# Patient Record
Sex: Male | Born: 1935 | Race: White | Hispanic: No | Marital: Married | Smoking: Former smoker
Health system: Southern US, Community
[De-identification: ages and names within clinical notes are randomized; demographics above are authoritative.]

## PROBLEM LIST (undated history)

## (undated) DIAGNOSIS — E785 Hyperlipidemia, unspecified: Secondary | ICD-10-CM

## (undated) DIAGNOSIS — J449 Chronic obstructive pulmonary disease, unspecified: Secondary | ICD-10-CM

## (undated) DIAGNOSIS — C44629 Squamous cell carcinoma of skin of left upper limb, including shoulder: Secondary | ICD-10-CM

## (undated) DIAGNOSIS — E079 Disorder of thyroid, unspecified: Secondary | ICD-10-CM

## (undated) DIAGNOSIS — K219 Gastro-esophageal reflux disease without esophagitis: Secondary | ICD-10-CM

## (undated) DIAGNOSIS — I10 Essential (primary) hypertension: Secondary | ICD-10-CM

## (undated) DIAGNOSIS — D649 Anemia, unspecified: Secondary | ICD-10-CM

## (undated) DIAGNOSIS — D126 Benign neoplasm of colon, unspecified: Secondary | ICD-10-CM

## (undated) HISTORY — DX: Hyperlipidemia, unspecified: E78.5

## (undated) HISTORY — DX: Squamous cell carcinoma of skin of left upper limb, including shoulder: C44.629

## (undated) HISTORY — DX: Benign neoplasm of colon, unspecified: D12.6

## (undated) HISTORY — DX: Essential (primary) hypertension: I10

## (undated) HISTORY — DX: Disorder of thyroid, unspecified: E07.9

## (undated) HISTORY — DX: Anemia, unspecified: D64.9

---

## 1968-01-30 HISTORY — PX: APPENDECTOMY: SHX54

## 2008-11-25 ENCOUNTER — Ambulatory Visit: Payer: Self-pay | Admitting: Cardiovascular Disease

## 2009-01-29 HISTORY — PX: COLONOSCOPY W/ POLYPECTOMY: SHX1380

## 2009-09-22 ENCOUNTER — Ambulatory Visit: Payer: Self-pay | Admitting: Gastroenterology

## 2009-10-10 ENCOUNTER — Ambulatory Visit: Payer: Self-pay | Admitting: Internal Medicine

## 2010-01-29 HISTORY — PX: CATARACT EXTRACTION, BILATERAL: SHX1313

## 2010-01-31 ENCOUNTER — Ambulatory Visit: Payer: Self-pay | Admitting: Ophthalmology

## 2010-02-07 ENCOUNTER — Ambulatory Visit: Payer: Self-pay | Admitting: Ophthalmology

## 2010-05-16 ENCOUNTER — Ambulatory Visit: Payer: Self-pay | Admitting: Ophthalmology

## 2010-10-28 IMAGING — NM NM THYROID IMAGING W/ UPTAKE SINGLE (24 HR)
1 series · 3 of 3 positions shown · non-contrast
Comparison: none

REASON FOR EXAM: HYPERTHYROIDISM PT IS ON METHIMAZOLE
COMMENTS:

[Series 1000: (id) thyroid scan · 2.40mm/px · 3 of 3 slices shown]
[im 1/3  full-range]
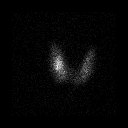
[im 2/3  full-range]
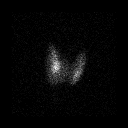
[im 3/3  full-range]
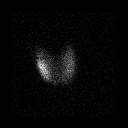

[3 of 3 positions shown; findings below may reference images not displayed]

PROCEDURE:     NM  - NM THYROID 7-100 24 HR [DATE]  [DATE]

RESULT:     The patient was given a dose of 148.3 uCi of Aodine-TN2 with
uptake calculations performed at 6 and 24 hours measuring 11.2 and
respectively which are both within normal limits. The gland demonstrates
relatively homogeneous pattern of uptake with slightly greater uptake in the
mid right lobe which could be secondary to geometric factors of it being
slightly larger and possibly thicker. No photopenia or dominant mass is
evident.
IMPRESSION: No definite findings to suggest hyperthyroidism based on
Aodine-TN2 scan and uptake.

## 2011-02-23 DIAGNOSIS — R946 Abnormal results of thyroid function studies: Secondary | ICD-10-CM | POA: Diagnosis not present

## 2011-02-28 LAB — HM DIABETES EYE EXAM

## 2011-03-02 DIAGNOSIS — E0500 Thyrotoxicosis with diffuse goiter without thyrotoxic crisis or storm: Secondary | ICD-10-CM | POA: Diagnosis not present

## 2011-05-16 DIAGNOSIS — E0500 Thyrotoxicosis with diffuse goiter without thyrotoxic crisis or storm: Secondary | ICD-10-CM | POA: Diagnosis not present

## 2011-05-17 DIAGNOSIS — E0590 Thyrotoxicosis, unspecified without thyrotoxic crisis or storm: Secondary | ICD-10-CM | POA: Diagnosis not present

## 2011-05-17 DIAGNOSIS — E119 Type 2 diabetes mellitus without complications: Secondary | ICD-10-CM | POA: Diagnosis not present

## 2011-05-17 DIAGNOSIS — R209 Unspecified disturbances of skin sensation: Secondary | ICD-10-CM | POA: Diagnosis not present

## 2011-06-26 ENCOUNTER — Encounter: Payer: Self-pay | Admitting: Internal Medicine

## 2011-06-26 ENCOUNTER — Ambulatory Visit (INDEPENDENT_AMBULATORY_CARE_PROVIDER_SITE_OTHER): Payer: Medicare Other | Admitting: Internal Medicine

## 2011-06-26 VITALS — BP 160/70 | HR 85 | Temp 98.5°F | Resp 14 | Ht 73.0 in | Wt 190.2 lb

## 2011-06-26 DIAGNOSIS — E785 Hyperlipidemia, unspecified: Secondary | ICD-10-CM

## 2011-06-26 DIAGNOSIS — E119 Type 2 diabetes mellitus without complications: Secondary | ICD-10-CM | POA: Diagnosis not present

## 2011-06-26 DIAGNOSIS — Z8719 Personal history of other diseases of the digestive system: Secondary | ICD-10-CM

## 2011-06-26 DIAGNOSIS — F1010 Alcohol abuse, uncomplicated: Secondary | ICD-10-CM

## 2011-06-26 DIAGNOSIS — D649 Anemia, unspecified: Secondary | ICD-10-CM

## 2011-06-26 DIAGNOSIS — I10 Essential (primary) hypertension: Secondary | ICD-10-CM | POA: Diagnosis not present

## 2011-06-26 DIAGNOSIS — Z79899 Other long term (current) drug therapy: Secondary | ICD-10-CM | POA: Diagnosis not present

## 2011-06-26 DIAGNOSIS — E0590 Thyrotoxicosis, unspecified without thyrotoxic crisis or storm: Secondary | ICD-10-CM

## 2011-06-26 DIAGNOSIS — M25551 Pain in right hip: Secondary | ICD-10-CM

## 2011-06-26 DIAGNOSIS — D126 Benign neoplasm of colon, unspecified: Secondary | ICD-10-CM

## 2011-06-26 MED ORDER — METOPROLOL SUCCINATE ER 50 MG PO TB24: 50.0000 mg | ORAL_TABLET | Freq: Every day | ORAL | Status: DC

## 2011-06-26 NOTE — Progress Notes (Signed)
Patient ID: Terry [name], male   DOB: [birth year], 78 y.o.   MRN: [id number]  Patient Active Problem List  Diagnoses  . Hyperlipidemia LDL goal < 70  . Hypertension  . Hyperthyroidism  . Tubulovillous adenoma of colon  . History of esophagitis  . Hip pain, acute, right  . Alcohol abuse, unspecified  . Diabetes mellitus type 2, noninsulin dependent  . Hyperlipidemia  . Anemia    Subjective:  CC:   Chief Complaint  Patient presents with  . New Patient    HPI:   Terry [name] a 78 y.o. male who presents New patient.  Terry [name] is a 78 yr old white male with a history of hyperthyroidism, hyperlipidemia and type 2 DM who presents for establishment of care.  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  But now has migrated to posterior lateral side of hip and is aggravated by lying on it ,  But not by sitting.  Describes the pain as a continuous throb.  He has been taking ibuprofen up to 5 tablets daily .   Pain does not radiate.    2nd issue is CAD.  He states that Dr. Welton Flakes has opined that he may have a blockage in his heart but has not had a comprehensive cardiology evaluation.  He denies chest pain, jaw pain and arm pain.  He does not exercise regularly.  He has hyperthyroid disease, managed by Dr. Renae Fickle with methimazole for the  last year.  3rd issue is type 2 diabetes.    On glipizide,  No history of hypoglycemic episodes.  Has a meter at home ,  Does not use it very often.  No history of low blood sugars, foot ulcers, foot infections.    Past Medical History  Diagnosis Date  . Diabetes mellitus   . hyperthyroidism   . Hyperlipidemia   . Hypertension   . Anemia     Past Surgical History  Procedure Date  . Cataract extraction, bilateral 2012    Porfilio  . Colonoscopy w/ polypectomy 2011  . Appendectomy 1970         The following portions of the patient's history  were reviewed and updated as appropriate: Allergies, current medications, and problem list.    Review of Systems:   12 Pt  review of systems was negative except those addressed in the HPI,     History   Social History  . Marital Status: Married    Spouse Name: N/A    Number of Children: N/A  . Years of Education: N/A   Occupational History  . Not on file.   Social History Main Topics  . Smoking status: Former Smoker    Types: Cigarettes    Quit date: 06/26/1999  . Smokeless tobacco: Never Used  . Alcohol Use: 15.0 oz/week    25 Glasses of wine per week  . Drug Use: No  . Sexually Active: Not on file   Other Topics Concern  . Not on file   Social History Narrative  . No narrative on file    Objective:  BP 160/70  Pulse 85  Temp(Src) 98.5 F (36.9 C) (Oral)  Resp 14  Ht 6\' 1"  (1.854 m)  Wt 190 lb 4 oz (86.297 kg)  BMI 25.10 kg/m2  SpO2 94%  General appearance: alert, cooperative and appears stated age Ears: normal TM's and external ear canals  both ears Throat: lips, mucosa, and tongue normal; teeth and gums normal Neck: no adenopathy, no carotid bruit, supple, symmetrical, trachea midline and thyroid not enlarged, symmetric, no tenderness/mass/nodules Back: symmetric, no curvature. ROM normal. No CVA tenderness. Lungs: clear to auscultation bilaterally Heart: regular rate and rhythm, S1, S2 normal, no murmur, click, rub or gallop Abdomen: soft, non-tender; bowel sounds normal; no masses,  no organomegaly Pulses: 2+ and symmetric Skin: Skin color, texture, turgor normal. No rashes or lesions Lymph nodes: Cervical, supraclavicular, and axillary nodes normal.  Assessment and Plan:  Hyperlipidemia LDL goal < 70 .taking simvastatin.  return or fasting labs.   Hyperthyroidism Managed by Dr. Renae Fickle with methimazole.   Hypertension Managed with enalapril.  No cough.  BMET due.  Tubulovillous adenoma of colon By colonoscopy August 2011,  Iftikhar.  Will  need repeat in 2014.   History of esophagitis By EGD August 2011, positive candida , negative H pylori and dysplasia.  Not currently on PPI.   Hip pain, acute, right Some pain with internal rotation. Exam more consistent with ischial  tuberosity bursitis.  NSIADs, Dexilant,  If no improvement 2 weeks x ray advised of hip joint.   Alcohol abuse, unspecified Patient described his daily alcohol use as "too much." Advised to reduce daily consumption to 2 glasses of wine max to prevent liver disease and loss of glycemic control.   Anemia Details unclear.  Prompted GI eval in 2011.   Diabetes mellitus type 2, noninsulin dependent Unknown level of control since he is new to practice and records from Dr. Welton Flakes are not currently available.  Does not check sugars.  No changes today.  Will return later in week for labs  Reminders for annual eye exam given, low glycemic index diet discussed and advised to reduce consumption of alcohol to 2 glasses daily .      Updated Medication List Outpatient Encounter Prescriptions as of 06/26/2011  Medication Sig Dispense Refill  . Cholecalciferol (VITAMIN D3) 1000 UNITS CAPS Take by mouth.      Marland Kitchen CINNAMON PO Take by mouth.      . enalapril (VASOTEC) 20 MG tablet Take 20 mg by mouth daily.      . Garlic 10 MG CAPS Take by mouth.      Marland Kitchen glipiZIDE (GLUCOTROL XL) 2.5 MG 24 hr tablet Take 2.5 mg by mouth daily.      . methimazole (TAPAZOLE) 5 MG tablet Take 2.5 mg by mouth daily.      . metoprolol succinate (TOPROL-XL) 50 MG 24 hr tablet Take 1 tablet (50 mg total) by mouth daily. Take with or immediately following a meal.  90 tablet  3  . niacin 500 MG tablet Take 500 mg by mouth daily with breakfast.      . simvastatin (ZOCOR) 40 MG tablet Take 40 mg by mouth every evening.         Orders Placed This Encounter  Procedures  . Lipid panel  . COMPLETE METABOLIC PANEL WITH GFR  . Microalbumin / creatinine urine ratio  . Hemoglobin A1c  . HM DIABETES EYE EXAM   . HM COLONOSCOPY    No Follow-up on file.      ,note

## 2011-06-26 NOTE — Patient Instructions (Signed)
Return for fasting las tomorrow.  I think your pain is coming from bursitis of the ischial tuberosity and will continue ot imporve with time.   I want you take take Dexilant daily for your stomach while you are using ibuprofen   You can take 500 mg of tylenol 4 times daily  ( or 1000 mg twice daily ) with the ibuprofen foe  Pain control.  Reduce wine to 2 healthy glasses daily to prevent liver disease  ______________________________________________________________  Consider the Low Glycemic Index Diet and 6 smaller meals daily .  This boosts your metabolism and regulates your sugars:   7 AM Low carbohydrate Protein  Shakes (EAS Carb Control  Or Atkins ,  Available everywhere,   In  cases at [employer] )  2.5 carbs  (Add or substitute a toasted sandwhich thin w/ peanut butter)  10 AM: Protein bar by Atkins (snack size,  Chocolate lover's variety at  [employer])    Lunch: sandwich on pita bread or flatbread (Joseph's makes a pita bread and a flat bread , available at [employer] and [employer]; Toufayah makes a low carb flatbread available at [employer] and [employer]) Mission makes a low carb whole wheat tortilla available at [employer] most grocery stores   3 PM:  Mid day :  Another protein bar,  Or a  cheese stick, 1/4 cup of almonds, walnuts, pistachios, pecans, peanuts,  Macadamia nuts  6 PM  Dinner:  "mean and green:"  Meat/chicken/fish, salad, and green veggie : use ranch, vinagrette,  Blue cheese, etc  9 PM snack : Breyer's low carb fudgsicle or  ice cream bar (Carb Smart), or  Weight Watcher's ice cream bar , or another protein shake

## 2011-06-28 ENCOUNTER — Encounter: Payer: Self-pay | Admitting: Internal Medicine

## 2011-06-28 ENCOUNTER — Other Ambulatory Visit (INDEPENDENT_AMBULATORY_CARE_PROVIDER_SITE_OTHER): Payer: Medicare Other | Admitting: *Deleted

## 2011-06-28 DIAGNOSIS — I10 Essential (primary) hypertension: Secondary | ICD-10-CM | POA: Insufficient documentation

## 2011-06-28 DIAGNOSIS — Z79899 Other long term (current) drug therapy: Secondary | ICD-10-CM

## 2011-06-28 DIAGNOSIS — M25551 Pain in right hip: Secondary | ICD-10-CM | POA: Insufficient documentation

## 2011-06-28 DIAGNOSIS — E1129 Type 2 diabetes mellitus with other diabetic kidney complication: Secondary | ICD-10-CM | POA: Insufficient documentation

## 2011-06-28 DIAGNOSIS — E119 Type 2 diabetes mellitus without complications: Secondary | ICD-10-CM | POA: Diagnosis not present

## 2011-06-28 DIAGNOSIS — E785 Hyperlipidemia, unspecified: Secondary | ICD-10-CM | POA: Diagnosis not present

## 2011-06-28 DIAGNOSIS — R809 Proteinuria, unspecified: Secondary | ICD-10-CM | POA: Insufficient documentation

## 2011-06-28 DIAGNOSIS — D126 Benign neoplasm of colon, unspecified: Secondary | ICD-10-CM

## 2011-06-28 DIAGNOSIS — N183 Chronic kidney disease, stage 3 unspecified: Secondary | ICD-10-CM | POA: Insufficient documentation

## 2011-06-28 DIAGNOSIS — E1122 Type 2 diabetes mellitus with diabetic chronic kidney disease: Secondary | ICD-10-CM | POA: Insufficient documentation

## 2011-06-28 DIAGNOSIS — IMO0001 Reserved for inherently not codable concepts without codable children: Secondary | ICD-10-CM

## 2011-06-28 DIAGNOSIS — Z8719 Personal history of other diseases of the digestive system: Secondary | ICD-10-CM | POA: Insufficient documentation

## 2011-06-28 DIAGNOSIS — E0590 Thyrotoxicosis, unspecified without thyrotoxic crisis or storm: Secondary | ICD-10-CM | POA: Insufficient documentation

## 2011-06-28 DIAGNOSIS — D649 Anemia, unspecified: Secondary | ICD-10-CM | POA: Insufficient documentation

## 2011-06-28 DIAGNOSIS — F1011 Alcohol abuse, in remission: Secondary | ICD-10-CM | POA: Insufficient documentation

## 2011-06-28 HISTORY — DX: Benign neoplasm of colon, unspecified: D12.6

## 2011-06-28 LAB — COMPREHENSIVE METABOLIC PANEL
AST: 18 U/L (ref 0–37)
Albumin: 4 g/dL (ref 3.5–5.2)
BUN: 20 mg/dL (ref 6–23)
CO2: 25 mEq/L (ref 19–32)
Calcium: 9.5 mg/dL (ref 8.4–10.5)
Chloride: 110 mEq/L (ref 96–112)
Creatinine, Ser: 1.2 mg/dL (ref 0.4–1.5)
GFR: 64.35 mL/min (ref >60.00)
Potassium: 4.8 mEq/L (ref 3.5–5.1)

## 2011-06-28 LAB — HEMOGLOBIN A1C: Hgb A1c MFr Bld: 6.2 % (ref 4.6–6.5)

## 2011-06-28 LAB — MICROALBUMIN / CREATININE URINE RATIO
Creatinine,U: 64.4 mg/dL
Microalb Creat Ratio: 2.2 mg/g (ref 0.0–30.0)
Microalb, Ur: 1.4 mg/dL (ref 0.0–1.9)

## 2011-06-28 LAB — LIPID PANEL
Cholesterol: 176 mg/dL (ref 0–200)
VLDL: 41.8 mg/dL — ABNORMAL HIGH (ref 0.0–40.0)

## 2011-06-28 LAB — LDL CHOLESTEROL, DIRECT: Direct LDL: 74.7 mg/dL

## 2011-06-28 NOTE — Assessment & Plan Note (Addendum)
Some pain with internal rotation. Exam more consistent with ischial  tuberosity bursitis.  NSIADs, Dexilant,  If no improvement 2 weeks x ray advised of hip joint.

## 2011-06-28 NOTE — Assessment & Plan Note (Signed)
.  taking simvastatin.  return or fasting labs.

## 2011-06-28 NOTE — Assessment & Plan Note (Signed)
Managed by Dr. Renae Fickle with methimazole.

## 2011-06-28 NOTE — Assessment & Plan Note (Signed)
By colonoscopy August 2011,  Iftikhar.  Will need repeat in 2014.

## 2011-06-28 NOTE — Assessment & Plan Note (Signed)
Unknown level of control since he is new to practice and records from Dr. Welton Flakes are not currently available.  Does not check sugars.  No changes today.  Will return later in week for labs  Reminders for annual eye exam given, low glycemic index diet discussed and advised to reduce consumption of alcohol to 2 glasses daily .

## 2011-06-28 NOTE — Assessment & Plan Note (Signed)
By EGD August 2011, positive candida , negative H pylori and dysplasia.  Not currently on PPI.

## 2011-06-28 NOTE — Assessment & Plan Note (Signed)
Details unclear.  Prompted GI eval in 2011.

## 2011-06-28 NOTE — Assessment & Plan Note (Deleted)
Unknown level of control since he is new to practice and records from Dr. Khan are not currently available.  Does not check sugars.  No changes today.  Will return later in week for labs  Reminders for annual eye exam given, low glycemic index diet discussed and advised to reduce consumption of alcohol to 2 glasses daily .   

## 2011-06-28 NOTE — Assessment & Plan Note (Signed)
Patient described his daily alcohol use as "too much." Advised to reduce daily consumption to 2 glasses of wine max to prevent liver disease and loss of glycemic control.

## 2011-06-28 NOTE — Assessment & Plan Note (Signed)
Managed with enalapril.  No cough.  BMET due.

## 2011-07-04 ENCOUNTER — Ambulatory Visit: Payer: Self-pay | Admitting: Internal Medicine

## 2011-07-13 ENCOUNTER — Ambulatory Visit: Payer: Self-pay | Admitting: Internal Medicine

## 2011-07-26 ENCOUNTER — Other Ambulatory Visit: Payer: Self-pay | Admitting: Internal Medicine

## 2011-08-16 ENCOUNTER — Other Ambulatory Visit: Payer: Self-pay | Admitting: *Deleted

## 2011-08-16 MED ORDER — SIMVASTATIN 40 MG PO TABS: 40.0000 mg | ORAL_TABLET | Freq: Every evening | ORAL | Status: DC

## 2011-08-28 DIAGNOSIS — E0500 Thyrotoxicosis with diffuse goiter without thyrotoxic crisis or storm: Secondary | ICD-10-CM | POA: Diagnosis not present

## 2011-09-04 DIAGNOSIS — E119 Type 2 diabetes mellitus without complications: Secondary | ICD-10-CM | POA: Diagnosis not present

## 2011-09-04 DIAGNOSIS — E538 Deficiency of other specified B group vitamins: Secondary | ICD-10-CM | POA: Diagnosis not present

## 2011-09-04 DIAGNOSIS — E0520 Thyrotoxicosis with toxic multinodular goiter without thyrotoxic crisis or storm: Secondary | ICD-10-CM | POA: Diagnosis not present

## 2011-10-20 ENCOUNTER — Other Ambulatory Visit: Payer: Self-pay | Admitting: Internal Medicine

## 2011-12-10 DIAGNOSIS — E538 Deficiency of other specified B group vitamins: Secondary | ICD-10-CM | POA: Diagnosis not present

## 2011-12-10 DIAGNOSIS — E0520 Thyrotoxicosis with toxic multinodular goiter without thyrotoxic crisis or storm: Secondary | ICD-10-CM | POA: Diagnosis not present

## 2011-12-10 DIAGNOSIS — E119 Type 2 diabetes mellitus without complications: Secondary | ICD-10-CM | POA: Diagnosis not present

## 2011-12-11 DIAGNOSIS — E538 Deficiency of other specified B group vitamins: Secondary | ICD-10-CM | POA: Diagnosis not present

## 2011-12-11 DIAGNOSIS — E119 Type 2 diabetes mellitus without complications: Secondary | ICD-10-CM | POA: Diagnosis not present

## 2011-12-11 DIAGNOSIS — E0590 Thyrotoxicosis, unspecified without thyrotoxic crisis or storm: Secondary | ICD-10-CM | POA: Diagnosis not present

## 2011-12-19 ENCOUNTER — Ambulatory Visit (INDEPENDENT_AMBULATORY_CARE_PROVIDER_SITE_OTHER): Payer: Medicare Other | Admitting: Internal Medicine

## 2011-12-19 VITALS — BP 134/68 | HR 83 | Temp 98.0°F | Resp 12 | Ht 73.0 in | Wt 189.5 lb

## 2011-12-19 DIAGNOSIS — R05 Cough: Secondary | ICD-10-CM | POA: Diagnosis not present

## 2011-12-19 DIAGNOSIS — J40 Bronchitis, not specified as acute or chronic: Secondary | ICD-10-CM

## 2011-12-19 MED ORDER — PREDNISONE (PAK) 10 MG PO TABS: ORAL_TABLET | ORAL | Status: DC

## 2011-12-19 MED ORDER — METHYLPREDNISOLONE ACETATE 40 MG/ML IJ SUSP
40.0000 mg | Freq: Once | INTRAMUSCULAR | Status: AC
  Administered 2011-12-19: 40 mg via INTRAMUSCULAR

## 2011-12-19 MED ORDER — LEVOFLOXACIN 750 MG PO TABS: 750.0000 mg | ORAL_TABLET | Freq: Every day | ORAL | Status: DC

## 2011-12-19 MED ORDER — ALBUTEROL SULFATE HFA 108 (90 BASE) MCG/ACT IN AERS: 2.0000 | INHALATION_SPRAY | Freq: Four times a day (QID) | RESPIRATORY_TRACT | Status: DC | PRN

## 2011-12-19 NOTE — Progress Notes (Signed)
Patient ID: Terry [name], male   DOB: [birth year], 78 y.o.   MRN: [id number]  Patient Active Problem List  Diagnosis  . Hyperlipidemia LDL goal < 70  . Hypertension  . Hyperthyroidism  . Tubulovillous adenoma of colon  . History of esophagitis  . Hip pain, acute, right  . Alcohol abuse, unspecified  . Diabetes mellitus type 2, noninsulin dependent  . Hyperlipidemia  . Anemia  . Bronchitis    Subjective:  CC:   Chief Complaint  Patient presents with  . Follow-up    cough    HPI:   Terry [name] a 78 y.o. male who presents treated for sinusitis last week with amoxicillin  875 mg twice daily by Dr. Renae [name] with no improvement.  Drainage was clear  But she had a cough productrive of green sputum.   Sinuses are still draining and his sputum is still green ,  No blood in it . No fevers,  Muscle aches.  But very tired. Mild  nausea but no vomiting.   The cough has improved..  Still feeling crappy and the drainiage starts up again in the morning and continues all day .    Aggravated by Terry [name] caught from wife Terry [name] during trip to [place].    Past Medical History  Diagnosis Date  . Diabetes mellitus   . hyperthyroidism   . Hyperlipidemia   . Hypertension   . Anemia     Past Surgical History  Procedure Date  . Cataract extraction, bilateral 2012    Terry [name]  . Colonoscopy w/ polypectomy 2011  . Appendectomy 1970    The following portions of the patient's history were reviewed and updated as appropriate: Allergies, current medications, and problem list.    Review of Systems:   Patient denies, fevers, malaise, unintentional weight loss, skin rash, eye pain, sinus congestion and sinus pain, sore throat, dysphagia,  hemoptysis ,dyspnea,  chest pain, palpitations, orthopnea, edema, abdominal pain, nausea, melena, diarrhea, constipation, flank pain, dysuria, hematuria, urinary  Frequency, nocturia, numbness, tingling, seizures,  Focal weakness, Loss of consciousness,  Tremor,  insomnia, depression, anxiety, and suicidal ideation.       History   Social History  . Marital Status: Married    Spouse Name: N/A    Number of Children: N/A  . Years of Education: N/A   Occupational History  . Not on file.   Social History Main Topics  . Smoking status: Former Smoker    Types: Cigarettes    Quit date: 06/26/1999  . Smokeless tobacco: Never Used  . Alcohol Use: 15.0 oz/week    25 Glasses of wine per week  . Drug Use: No  . Sexually Active: Not on file   Other Topics Concern  . Not on file   Social History Narrative  . No narrative on file    Objective:  BP 134/68  Pulse 83  Temp 98 F (36.7 C) (Oral)  Resp 12  Ht 6\' 1"  (1.854 m)  Wt 189 lb 8 oz (85.957 kg)  BMI 25.00 kg/m2  SpO2 97%  General appearance: alert, cooperative and appears stated age Ears: normal TM's and external ear canals both ears Throat: lips, mucosa, and tongue normal; teeth and gums normal Neck: no adenopathy, no carotid bruit, supple, symmetrical, trachea midline and thyroid not enlarged, symmetric, no tenderness/mass/nodules Back: symmetric, no curvature. ROM normal. No CVA tenderness. Lungs: clear to auscultation bilaterally Heart: regular rate and rhythm, S1, S2 normal, no murmur, click, rub or gallop Abdomen: soft,  non-tender; bowel sounds normal; no masses,  no organomegaly Pulses: 2+ and symmetric Skin: Skin color, texture, turgor normal. No rashes or lesions Lymph nodes: Cervical, supraclavicular, and axillary nodes normal.  Assessment and Plan:  Bronchitis I am prescribing a prednisone taper, a 7 day course of levaquin, and albuterol inhaler if needed for wheezing/chest tightness (use every 6 hours if needed).    Updated Medication List Outpatient Encounter Prescriptions as of 12/19/2011  Medication Sig Dispense Refill  . [EXPIRED] amoxicillin (AMOXIL) 875 MG tablet Take 875 mg by mouth 2 (two) times daily.      . Cholecalciferol (VITAMIN D3) 1000 UNITS  CAPS Take by mouth.      Marland Kitchen CINNAMON PO Take by mouth.      . enalapril (VASOTEC) 20 MG tablet Take 20 mg by mouth daily.      . Garlic 10 MG CAPS Take by mouth.      Marland Kitchen glipiZIDE (GLUCOTROL XL) 2.5 MG 24 hr tablet TAKE 1 TABLET BY MOUTH EVERY DAY  30 tablet  6  . methimazole (TAPAZOLE) 5 MG tablet Take 2.5 mg by mouth daily.      . metoprolol succinate (TOPROL-XL) 50 MG 24 hr tablet Take 1 tablet (50 mg total) by mouth daily. Take with or immediately following a meal.  90 tablet  3  . niacin 500 MG tablet Take 500 mg by mouth daily with breakfast.      . simvastatin (ZOCOR) 40 MG tablet Take 1 tablet (40 mg total) by mouth every evening.  30 tablet  6  . albuterol (PROVENTIL HFA;VENTOLIN HFA) 108 (90 BASE) MCG/ACT inhaler Inhale 2 puffs into the lungs every 6 (six) hours as needed for wheezing.  1 Inhaler  0  . levofloxacin (LEVAQUIN) 750 MG tablet Take 1 tablet (750 mg total) by mouth daily.  7 tablet  0  . predniSONE (STERAPRED UNI-PAK) 10 MG tablet 6 tablets on Day 1 , then reduce by 1 tablet daily until gone  21 tablet  0  . [EXPIRED] methylPREDNISolone acetate (DEPO-MEDROL) injection 40 mg          No orders of the defined types were placed in this encounter.    No Follow-up on file.

## 2011-12-19 NOTE — Patient Instructions (Addendum)
I am prescribing you a prednisone taper, a 7 day course of levaquin,  And an albuterol inhaler to use every 6 hours if needed for wheezing/chest tightness (2 puffs every 6 hours if needed).     Use the Robitussin DM or Delsym OTC for daytime cough  You can try taking benadryl 25 mg at bedtime which may help dry your nose.

## 2011-12-22 ENCOUNTER — Encounter: Payer: Self-pay | Admitting: Internal Medicine

## 2011-12-22 DIAGNOSIS — J449 Chronic obstructive pulmonary disease, unspecified: Secondary | ICD-10-CM | POA: Insufficient documentation

## 2011-12-22 NOTE — Assessment & Plan Note (Signed)
I am prescribing a prednisone taper, a 7 day course of levaquin, and albuterol inhaler if needed for wheezing/chest tightness (use every 6 hours if needed).

## 2012-03-15 ENCOUNTER — Other Ambulatory Visit: Payer: Self-pay

## 2012-03-20 ENCOUNTER — Other Ambulatory Visit: Payer: Self-pay | Admitting: Internal Medicine

## 2012-03-20 NOTE — Telephone Encounter (Signed)
Med filled.  

## 2012-05-25 ENCOUNTER — Other Ambulatory Visit: Payer: Self-pay | Admitting: Internal Medicine

## 2012-05-25 DIAGNOSIS — E119 Type 2 diabetes mellitus without complications: Secondary | ICD-10-CM

## 2012-05-25 DIAGNOSIS — E785 Hyperlipidemia, unspecified: Secondary | ICD-10-CM

## 2012-05-25 NOTE — Telephone Encounter (Signed)
Please inform patient that as a patient with DM he should be seen every 3 months and his liver enzymes should be checked at least every 6 months because of the statin he is taking. .  Last seen May 2013!!  Refill glipizide for 90 days but get him a 30 appt with fasting labs prior to visit .  I wil enter labs.

## 2012-05-26 NOTE — Telephone Encounter (Signed)
Spoke with pt's wife, advised on need for labs and visit. She stated he has an appointment with his endocrinologist in May where they will draw labs, and will have labs sent to  Dr. Darrick Huntsman. States pt will call back to schedule a visit with Dr. Darrick Huntsman.

## 2012-06-12 ENCOUNTER — Other Ambulatory Visit: Payer: Self-pay | Admitting: Internal Medicine

## 2012-06-12 ENCOUNTER — Ambulatory Visit (INDEPENDENT_AMBULATORY_CARE_PROVIDER_SITE_OTHER): Payer: Medicare Other | Admitting: Adult Health

## 2012-06-12 ENCOUNTER — Encounter: Payer: Self-pay | Admitting: Adult Health

## 2012-06-12 VITALS — BP 168/72 | HR 65 | Temp 97.9°F | Resp 12 | Wt 193.0 lb

## 2012-06-12 DIAGNOSIS — H6190 Disorder of external ear, unspecified, unspecified ear: Secondary | ICD-10-CM

## 2012-06-12 DIAGNOSIS — E119 Type 2 diabetes mellitus without complications: Secondary | ICD-10-CM | POA: Diagnosis not present

## 2012-06-12 DIAGNOSIS — H6192 Disorder of left external ear, unspecified: Secondary | ICD-10-CM

## 2012-06-12 DIAGNOSIS — H9390 Unspecified disorder of ear, unspecified ear: Secondary | ICD-10-CM | POA: Insufficient documentation

## 2012-06-12 DIAGNOSIS — I10 Essential (primary) hypertension: Secondary | ICD-10-CM | POA: Diagnosis not present

## 2012-06-12 DIAGNOSIS — E0590 Thyrotoxicosis, unspecified without thyrotoxic crisis or storm: Secondary | ICD-10-CM | POA: Diagnosis not present

## 2012-06-12 MED ORDER — ENALAPRIL MALEATE 20 MG PO TABS: 20.0000 mg | ORAL_TABLET | Freq: Every day | ORAL | Status: DC

## 2012-06-12 NOTE — Progress Notes (Signed)
  Subjective:    Patient ID: Terry [name], male    DOB: 1933/08/30, 79 y.o.   MRN: [id number]  HPI  Patient is a 78 year old male who presents to clinic for refills on his medications. He has run out of his enalapril. Patient was last seen in clinic early part of 2013. He is also followed by Dr. Renae Fickle at [facility] and he reports having an appointment for lab work later this morning. He is feeling well. He is staying active. Patient works at [employer] in [place]. He does have an area on his left ear that he would like to be looked at.  Current Outpatient Prescriptions on File Prior to Visit  Medication Sig Dispense Refill  . CINNAMON PO Take by mouth.      Marland Kitchen glipiZIDE (GLUCOTROL XL) 2.5 MG 24 hr tablet TAKE 1 TABLET BY MOUTH EVERY DAY  30 tablet  6  . methimazole (TAPAZOLE) 5 MG tablet Take 2.5 mg by mouth daily.      . metoprolol succinate (TOPROL-XL) 50 MG 24 hr tablet Take 1 tablet (50 mg total) by mouth daily. Take with or immediately following a meal.  90 tablet  3  . niacin 500 MG tablet Take 500 mg by mouth daily with breakfast.      . simvastatin (ZOCOR) 40 MG tablet TAKE 1 TABLET (40 MG TOTAL) BY MOUTH EVERY EVENING.  30 tablet  6  . albuterol (PROVENTIL HFA;VENTOLIN HFA) 108 (90 BASE) MCG/ACT inhaler Inhale 2 puffs into the lungs every 6 (six) hours as needed for wheezing.  1 Inhaler  0  . Cholecalciferol (VITAMIN D3) 1000 UNITS CAPS Take by mouth.      . Garlic 10 MG CAPS Take by mouth.       No current facility-administered medications on file prior to visit.      Review of Systems  Constitutional: Negative.   HENT:       Small, hard area on left ear. He has removed it and reports "it keeps coming back"  Eyes: Negative.   Respiratory: Negative.   Cardiovascular: Negative.   Gastrointestinal: Negative.   Endocrine: Negative.   Genitourinary: Negative.   Musculoskeletal: Negative.   Skin:       Small lesion on the left ear. No bleeding or  drainage.  Allergic/Immunologic: Negative.   Neurological: Negative.   Hematological: Negative.   Psychiatric/Behavioral: Negative.      BP 168/72  Pulse 65  Temp(Src) 97.9 F (36.6 C) (Oral)  Resp 12  Wt 193 lb (87.544 kg)  BMI 25.47 kg/m2  SpO2 95%    Objective:   Physical Exam  Constitutional: He is oriented to person, place, and time. He appears well-developed and well-nourished. No distress.  HENT:  Head: Normocephalic and atraumatic.  Cardiovascular: Normal rate, regular rhythm and normal heart sounds.  Exam reveals no gallop and no friction rub.   No murmur heard. Pulmonary/Chest: Effort normal and breath sounds normal. No respiratory distress. He has no wheezes. He has no rales.  Abdominal: Soft.  Musculoskeletal: Normal range of motion. He exhibits no edema.  Neurological: He is alert and oriented to person, place, and time.  Skin: Skin is warm and dry. No rash noted. No erythema.  Small white, hard lesion on the left ear.  Psychiatric: He has a normal mood and affect. His behavior is normal. Judgment and thought content normal.          Assessment & Plan:

## 2012-06-12 NOTE — Assessment & Plan Note (Signed)
Blood pressure is elevated this morning. Patient reports he has not taken his blood pressure medication and he has also run out of of his enalapril. Refills sent to pharmacy.

## 2012-06-12 NOTE — Assessment & Plan Note (Signed)
Patient reports this lesion is coming back. No drainage, bleeding or infection noted. Refer to dermatology for further evaluation.

## 2012-06-12 NOTE — Patient Instructions (Addendum)
  I have sent in the refills to your pharmacy.  Please have Dr. Renae Fickle send [place] a copy of your lab work.  I am referring you to [facility] to evaluate the small lesion on your left ear.  Do not hesitate to call with any questions or concerns.  Have a great summer!

## 2012-06-12 NOTE — Telephone Encounter (Signed)
Rx sent to pharmacy by escript  

## 2012-06-13 ENCOUNTER — Encounter: Payer: Self-pay | Admitting: Emergency Medicine

## 2012-06-17 DIAGNOSIS — E0500 Thyrotoxicosis with diffuse goiter without thyrotoxic crisis or storm: Secondary | ICD-10-CM | POA: Diagnosis not present

## 2012-06-17 DIAGNOSIS — E538 Deficiency of other specified B group vitamins: Secondary | ICD-10-CM | POA: Diagnosis not present

## 2012-06-17 DIAGNOSIS — E119 Type 2 diabetes mellitus without complications: Secondary | ICD-10-CM | POA: Diagnosis not present

## 2012-06-18 ENCOUNTER — Telehealth: Payer: Self-pay | Admitting: Internal Medicine

## 2012-06-18 DIAGNOSIS — E119 Type 2 diabetes mellitus without complications: Secondary | ICD-10-CM

## 2012-07-28 DIAGNOSIS — L219 Seborrheic dermatitis, unspecified: Secondary | ICD-10-CM | POA: Diagnosis not present

## 2012-07-28 DIAGNOSIS — L570 Actinic keratosis: Secondary | ICD-10-CM | POA: Diagnosis not present

## 2012-09-03 ENCOUNTER — Other Ambulatory Visit: Payer: Self-pay

## 2012-10-02 ENCOUNTER — Encounter: Payer: Self-pay | Admitting: Internal Medicine

## 2012-10-02 ENCOUNTER — Ambulatory Visit (INDEPENDENT_AMBULATORY_CARE_PROVIDER_SITE_OTHER)
Admission: RE | Admit: 2012-10-02 | Discharge: 2012-10-02 | Disposition: A | Discharge status: Home / Self Care | Payer: Medicare Other | Source: Ambulatory Visit | Attending: Internal Medicine | Admitting: Internal Medicine

## 2012-10-02 ENCOUNTER — Encounter: Payer: Self-pay | Admitting: Emergency Medicine

## 2012-10-02 ENCOUNTER — Encounter: Payer: Self-pay | Admitting: *Deleted

## 2012-10-02 ENCOUNTER — Ambulatory Visit (INDEPENDENT_AMBULATORY_CARE_PROVIDER_SITE_OTHER): Payer: Medicare Other | Admitting: Internal Medicine

## 2012-10-02 VITALS — BP 118/52 | HR 80 | Temp 97.7°F | Resp 14 | Ht 73.0 in | Wt 198.2 lb

## 2012-10-02 DIAGNOSIS — R0609 Other forms of dyspnea: Secondary | ICD-10-CM

## 2012-10-02 DIAGNOSIS — R0902 Hypoxemia: Secondary | ICD-10-CM | POA: Insufficient documentation

## 2012-10-02 DIAGNOSIS — E875 Hyperkalemia: Secondary | ICD-10-CM

## 2012-10-02 DIAGNOSIS — Z23 Encounter for immunization: Secondary | ICD-10-CM

## 2012-10-02 DIAGNOSIS — E785 Hyperlipidemia, unspecified: Secondary | ICD-10-CM

## 2012-10-02 DIAGNOSIS — R5381 Other malaise: Secondary | ICD-10-CM | POA: Insufficient documentation

## 2012-10-02 DIAGNOSIS — R0600 Dyspnea, unspecified: Secondary | ICD-10-CM | POA: Insufficient documentation

## 2012-10-02 DIAGNOSIS — Z79899 Other long term (current) drug therapy: Secondary | ICD-10-CM

## 2012-10-02 DIAGNOSIS — Z8719 Personal history of other diseases of the digestive system: Secondary | ICD-10-CM

## 2012-10-02 DIAGNOSIS — I10 Essential (primary) hypertension: Secondary | ICD-10-CM

## 2012-10-02 DIAGNOSIS — D649 Anemia, unspecified: Secondary | ICD-10-CM

## 2012-10-02 DIAGNOSIS — D126 Benign neoplasm of colon, unspecified: Secondary | ICD-10-CM

## 2012-10-02 LAB — COMPREHENSIVE METABOLIC PANEL
Albumin: 4.1 g/dL (ref 3.5–5.2)
BUN: 19 mg/dL (ref 6–23)
CO2: 28 mEq/L (ref 19–32)
Calcium: 9.6 mg/dL (ref 8.4–10.5)
Chloride: 104 mEq/L (ref 96–112)
Creatinine, Ser: 1.2 mg/dL (ref 0.4–1.5)
GFR: 59.98 mL/min — ABNORMAL LOW (ref >60.00)
Glucose, Bld: 131 mg/dL — ABNORMAL HIGH (ref 70–99)
Potassium: 5.3 mEq/L — ABNORMAL HIGH (ref 3.5–5.1)

## 2012-10-02 LAB — CBC WITH DIFFERENTIAL/PLATELET
Basophils Relative: 0.8 % (ref 0.0–3.0)
Eosinophils Relative: 3.7 % (ref 0.0–5.0)
Lymphocytes Relative: 26.9 % (ref 12.0–46.0)
MCV: 94.3 fl (ref 78.0–100.0)
Monocytes Absolute: 0.7 10*3/uL (ref 0.1–1.0)
Neutrophils Relative %: 59.1 % (ref 43.0–77.0)
Platelets: 317 10*3/uL (ref 150.0–400.0)
RBC: 3.94 Mil/uL — ABNORMAL LOW (ref 4.22–5.81)
WBC: 7.2 10*3/uL (ref 4.5–10.5)

## 2012-10-02 LAB — BRAIN NATRIURETIC PEPTIDE: Pro B Natriuretic peptide (BNP): 35 pg/mL (ref 0.0–100.0)

## 2012-10-02 MED ORDER — FLUTICASONE-SALMETEROL 250-50 MCG/DOSE IN AEPB: 1.0000 | INHALATION_SPRAY | Freq: Two times a day (BID) | RESPIRATORY_TRACT | Status: DC

## 2012-10-02 MED ORDER — IPRATROPIUM-ALBUTEROL 20-100 MCG/ACT IN AERS: 1.0000 | INHALATION_SPRAY | Freq: Four times a day (QID) | RESPIRATORY_TRACT | Status: DC | PRN

## 2012-10-02 NOTE — Patient Instructions (Signed)
I suspect that you have COPD which is now too advanced to ignore the symptoms  Pulmonary function tests and Chest x ray will confirm or rule out this diagnosis  Advair is a medication that will dilate your airways.,  Use one puff tiwce daily every single day  Use the combivent inhaler (sent to pharmacy) every 6 hours as needed   Referral to Dr Kendrick Fries in progress (our pulmonologist) or me in 2 weeks

## 2012-10-02 NOTE — Progress Notes (Addendum)
Patient ID: Terry [name], male   DOB: 1933/10/18, 79 y.o.   MRN: [id number]  Patient Active Problem List   Diagnosis Date Noted  . Tubulovillous adenoma of colon 06/28/2011    Priority: High  . Dyspnea and respiratory abnormality 10/02/2012  . Other malaise and fatigue 10/02/2012  . Ear lesion 06/12/2012  . Bronchitis 12/22/2011  . Hyperlipidemia LDL goal < 70 06/28/2011  . Hypertension 06/28/2011  . Hyperthyroidism 06/28/2011  . History of esophagitis 06/28/2011  . Hip pain, acute, right 06/28/2011  . Alcohol abuse, unspecified 06/28/2011  . Diabetes mellitus type 2, noninsulin dependent   . Anemia     Subjective:  CC:   Chief Complaint  Patient presents with  . Fatigue    Can work but has to lay down when coming home.    HPI:   Terry [name] a 79 y.o. male who presents for evaluation of daily persistent Fatigue lasting several few weeks.  Wakes up tired.  Snores occasionally .  Does not wake up coughing or gasping.  No regular exercise in the past years, but walks a lot at work.  Denies joint pain and chest pain.  Has an occasional cough productive of nonpurulent sputum, more often occurring in the later afternoon or evening,  Not early am.  Denies headaches.   50 pack yr tobacco user,  quit 12 smoking 12 yrs ago cold [place] after going on a cruise and couldn't breathe during a scuba diving expedition.  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     In the office his room air saturations dropped to 87% with ambulation.after 3 minutes.  After placing 2 L supplemental  02 his saturations went up to 97% and remained at 97% with ambulation.    Past Medical History  Diagnosis Date  . Diabetes mellitus   . hyperthyroidism   . Hyperlipidemia   . Hypertension   . Anemia     Past Surgical History  Procedure Laterality Date  . Cataract extraction, bilateral  2012    Porfilio  .  Colonoscopy w/ polypectomy  2011  . Appendectomy  1970       The following portions of the patient's history were reviewed and updated as appropriate: Allergies, current medications, and problem list.    Review of Systems:   Patient denies headache, fevers,  unintentional weight loss, skin rash, eye pain, sinus congestion and sinus pain, sore throat, dysphagia,  hemoptysis ,wheezing, chest pain, palpitations, orthopnea, edema, abdominal pain, nausea, melena, diarrhea, constipation, flank pain, dysuria, hematuria, urinary  Frequency, nocturia, numbness, tingling, seizures,  Focal weakness, Loss of consciousness,  Tremor, insomnia, depression, anxiety, and suicidal ideation.       History   Social History  . Marital Status: Married    Spouse Name: N/A    Number of Children: N/A  . Years of Education: N/A   Occupational History  . Not on file.   Social History Main Topics  . Smoking status: Former Smoker    Types: Cigarettes    Quit date: 06/26/1999  . Smokeless tobacco: Never Used  . Alcohol Use: 15.0 oz/week    25 Glasses of wine per week  . Drug Use: No  . Sexual Activity: Yes   Other Topics Concern  . Not on file   Social History Narrative  . No narrative on file    Objective:  Filed Vitals:   10/02/12  1121  BP: 118/52  Pulse: 80  Temp: 97.7 F (36.5 C)  Resp: 14     General appearance: alert, cooperative and appears stated age Ears: normal TM's and external ear canals both ears Throat: lips, mucosa, and tongue normal; teeth and gums normal Neck: no adenopathy, no carotid bruit, supple, symmetrical, trachea midline and thyroid not enlarged, symmetric, no tenderness/mass/nodules Back: symmetric, no curvature. ROM normal. No CVA tenderness. Lungs: diminished BS bilaterally,  prlonged expiratory phase without wheezing, no egophony Heart: regular rate and rhythm, S1, S2 normal, no murmur, click, rub or gallop Abdomen: soft, non-tender; bowel sounds  normal; no masses,  no organomegaly Pulses: 2+ and symmetric Skin: Skin color, texture, turgor normal. No rashes or lesions Lymph nodes: Cervical, supraclavicular, and axillary nodes normal.  Assessment and Plan:  Other malaise and fatigue His history of alcohol and tobacco abuse cardiomyopathy, cirrhosis, anemia, and COPD are likely causes. He is not overweight and has no signs or symptoms of sleep apnea. He is hypoxic on room air with ambulation .  We'll screen for anemia , chf , liver and kidney failure today as well. Chest x-ray and labs ordered. Pulmonary function tests also ordered. He'll followup with either me or Dr. Kendrick Fries in 2 weeks.  Dyspnea and respiratory abnormality I suspect he has had COPD for years but has been compensating until recently. He is now hypoxic with room air ambulation. Pulmonary function tests, chest x-ray, and supplemental O2 have been ordered. He has been given Advair samples and prescription for Combivent inhaler sent to pharmacy  Hypertension Well controlled on current regimen. Renal function stable, no changes today.  Hyperlipidemia LDL goal < 70 He did not return for fasting labs as  ordered so we will get a LDL today.  Tubulovillous adenoma of colon By colonoscopy in 2011 by Dr. Niel Hummer with high grade dysplasia. He is due for followup colonoscopy. Refer to Dr Lemar Livings.    Anemia etiology unclear. EGD and colonoscopy done in 2011. Fecal cold blood test was positive in 2013 but he did not followup with GI. Continue B12 supplements. Check hemoglobin , iron studies and B12 /folate today.   Hypoxemia Ambulatory sats on room air at 5 minutes dropped to 87. On 2 L of oxygen ambulatory sats improved to 97%. Portable oxygen tank ordered for use with ambulation.   Updated Medication List Outpatient Encounter Prescriptions as of 10/02/2012  Medication Sig Dispense Refill  . b complex vitamins tablet Take 1 tablet by mouth daily.      . Cholecalciferol  (VITAMIN D3) 1000 UNITS CAPS Take by mouth.      Marland Kitchen CINNAMON PO Take by mouth.      . cyanocobalamin 1000 MCG tablet Take 1,000 mcg by mouth daily.      . enalapril (VASOTEC) 20 MG tablet Take 1 tablet (20 mg total) by mouth daily.  30 tablet  12  . glipiZIDE (GLUCOTROL XL) 2.5 MG 24 hr tablet TAKE 1 TABLET BY MOUTH EVERY DAY  30 tablet  6  . methimazole (TAPAZOLE) 5 MG tablet Take 2.5 mg by mouth daily.      . metoprolol succinate (TOPROL-XL) 50 MG 24 hr tablet TAKE 1 TABLET (50 MG TOTAL) BY MOUTH DAILY. TAKE WITH OR IMMEDIATELY FOLLOWING A MEAL.  90 tablet  5  . simvastatin (ZOCOR) 40 MG tablet TAKE 1 TABLET (40 MG TOTAL) BY MOUTH EVERY EVENING.  30 tablet  6  . Fluticasone-Salmeterol (ADVAIR DISKUS) 250-50 MCG/DOSE AEPB Inhale 1 puff into  the lungs 2 (two) times daily.  28 each  0  . Ipratropium-Albuterol (COMBIVENT) 20-100 MCG/ACT AERS respimat Inhale 1 puff into the lungs every 6 (six) hours as needed for wheezing.  4 g  11  . [DISCONTINUED] albuterol (PROVENTIL HFA;VENTOLIN HFA) 108 (90 BASE) MCG/ACT inhaler Inhale 2 puffs into the lungs every 6 (six) hours as needed for wheezing.  1 Inhaler  0  . [DISCONTINUED] Garlic 10 MG CAPS Take by mouth.      . [DISCONTINUED] niacin 500 MG tablet Take 500 mg by mouth daily with breakfast.       No facility-administered encounter medications on file as of 10/02/2012.

## 2012-10-02 NOTE — Addendum Note (Signed)
Addended by: Sherlene Shams on: 10/02/2012 03:35 PM   Modules accepted: Orders

## 2012-10-02 NOTE — Assessment & Plan Note (Addendum)
His history of alcohol and tobacco abuse cardiomyopathy, cirrhosis, anemia, and COPD are likely causes. He is not overweight and has no signs or symptoms of sleep apnea. He is hypoxic on room air with ambulation .  We'll screen for anemia , chf , liver and kidney failure today as well. Chest x-ray and labs ordered. Pulmonary function tests also ordered. He'll followup with either me or Dr. Kendrick Fries in 2 weeks.

## 2012-10-02 NOTE — Assessment & Plan Note (Signed)
Ambulatory sats on room air at 5 minutes dropped to 87. On 2 L of oxygen ambulatory sats improved to 97%. Portable oxygen tank ordered for use with ambulation.

## 2012-10-02 NOTE — Assessment & Plan Note (Signed)
Well controlled on current regimen. Renal function stable, no changes today. 

## 2012-10-02 NOTE — Assessment & Plan Note (Signed)
He did not return for fasting labs as  ordered so we will get a LDL today.

## 2012-10-02 NOTE — Addendum Note (Signed)
Addended by: Dennie Bible on: 10/02/2012 03:53 PM   Modules accepted: Orders

## 2012-10-02 NOTE — Assessment & Plan Note (Signed)
By colonoscopy in 2011 by Dr. Niel Hummer with high grade dysplasia. He is due for followup colonoscopy. Refer to Dr Lemar Livings.

## 2012-10-02 NOTE — Assessment & Plan Note (Signed)
etiology unclear. EGD and colonoscopy done in 2011. Fecal cold blood test was positive in 2013 but he did not followup with GI. Continue B12 supplements. Check hemoglobin , iron studies and B12 /folate today.

## 2012-10-02 NOTE — Assessment & Plan Note (Signed)
I suspect he has had COPD for years but has been compensating until recently. He is now hypoxic with room air ambulation. Pulmonary function tests, chest x-ray, and supplemental O2 have been ordered. He has been given Advair samples and prescription for Combivent inhaler sent to pharmacy

## 2012-10-04 NOTE — Addendum Note (Signed)
Addended by: Sherlene Shams on: 10/04/2012 06:26 PM   Modules accepted: Orders

## 2012-10-06 NOTE — Telephone Encounter (Signed)
Pt mailed unread message

## 2012-10-08 ENCOUNTER — Telehealth: Payer: Self-pay | Admitting: Internal Medicine

## 2012-10-08 DIAGNOSIS — R0902 Hypoxemia: Secondary | ICD-10-CM

## 2012-10-08 DIAGNOSIS — J449 Chronic obstructive pulmonary disease, unspecified: Secondary | ICD-10-CM

## 2012-10-08 NOTE — Telephone Encounter (Signed)
Separate order for OCD Tittration, and for POC potable concentrator needed patient would like this rather than tank, contacted [facility] and found has to be separate orders.

## 2012-10-08 NOTE — Telephone Encounter (Signed)
DME order for POC on printer  Don't know what the other abbreviation is for., please clarify

## 2012-10-08 NOTE — Telephone Encounter (Signed)
Pt is needing order for OCD Tittration and POC Portable concentrator instead of tank. Please contact [facility] with order

## 2012-10-09 NOTE — Telephone Encounter (Signed)
Oxygen conserving device (OCD) this is titration to see if patient can tolerate POC if not insurance will not cover POC. Need written order for OCD.

## 2012-10-10 ENCOUNTER — Other Ambulatory Visit (INDEPENDENT_AMBULATORY_CARE_PROVIDER_SITE_OTHER): Payer: Medicare Other

## 2012-10-10 DIAGNOSIS — E875 Hyperkalemia: Secondary | ICD-10-CM

## 2012-10-10 DIAGNOSIS — R5381 Other malaise: Secondary | ICD-10-CM | POA: Diagnosis not present

## 2012-10-10 DIAGNOSIS — D649 Anemia, unspecified: Secondary | ICD-10-CM | POA: Diagnosis not present

## 2012-10-10 LAB — FERRITIN: Ferritin: 176.7 ng/mL (ref 22.0–322.0)

## 2012-10-10 LAB — BASIC METABOLIC PANEL
GFR: 66.09 mL/min (ref >60.00)
Potassium: 5 mEq/L (ref 3.5–5.1)
Sodium: 138 mEq/L (ref 135–145)

## 2012-10-10 LAB — VITAMIN B12: Vitamin B-12: 479 pg/mL (ref 211–911)

## 2012-10-10 LAB — TSH: TSH: 1.65 u[IU]/mL (ref 0.35–5.50)

## 2012-10-11 LAB — IRON AND TIBC
TIBC: 281 ug/dL (ref 215–435)
UIBC: 185 ug/dL (ref 125–400)

## 2012-10-12 ENCOUNTER — Encounter: Payer: Self-pay | Admitting: Internal Medicine

## 2012-10-13 ENCOUNTER — Encounter: Payer: Self-pay | Admitting: Internal Medicine

## 2012-10-13 LAB — HM COLONOSCOPY

## 2012-10-14 ENCOUNTER — Ambulatory Visit: Payer: Self-pay | Admitting: Internal Medicine

## 2012-10-14 DIAGNOSIS — R0602 Shortness of breath: Secondary | ICD-10-CM | POA: Diagnosis not present

## 2012-10-14 DIAGNOSIS — R0609 Other forms of dyspnea: Secondary | ICD-10-CM | POA: Diagnosis not present

## 2012-10-14 LAB — PULMONARY FUNCTION TEST

## 2012-10-15 NOTE — Telephone Encounter (Signed)
Mailed unread message to pt  

## 2012-10-16 ENCOUNTER — Encounter: Payer: Self-pay | Admitting: Internal Medicine

## 2012-10-17 ENCOUNTER — Ambulatory Visit: Payer: Medicare Other | Admitting: Internal Medicine

## 2012-10-20 ENCOUNTER — Encounter: Payer: Self-pay | Admitting: Emergency Medicine

## 2012-10-20 ENCOUNTER — Encounter: Payer: Self-pay | Admitting: Internal Medicine

## 2012-10-20 ENCOUNTER — Ambulatory Visit (INDEPENDENT_AMBULATORY_CARE_PROVIDER_SITE_OTHER): Payer: Medicare Other | Admitting: Internal Medicine

## 2012-10-20 VITALS — BP 168/82 | HR 78 | Temp 98.0°F | Resp 14 | Wt 199.2 lb

## 2012-10-20 DIAGNOSIS — R0902 Hypoxemia: Secondary | ICD-10-CM | POA: Diagnosis not present

## 2012-10-20 DIAGNOSIS — E119 Type 2 diabetes mellitus without complications: Secondary | ICD-10-CM | POA: Diagnosis not present

## 2012-10-20 DIAGNOSIS — J4489 Other specified chronic obstructive pulmonary disease: Secondary | ICD-10-CM

## 2012-10-20 DIAGNOSIS — J439 Emphysema, unspecified: Secondary | ICD-10-CM

## 2012-10-20 DIAGNOSIS — J438 Other emphysema: Secondary | ICD-10-CM | POA: Diagnosis not present

## 2012-10-20 DIAGNOSIS — J449 Chronic obstructive pulmonary disease, unspecified: Secondary | ICD-10-CM | POA: Diagnosis not present

## 2012-10-20 LAB — MICROALBUMIN / CREATININE URINE RATIO: Microalb Creat Ratio: 4 mg/g (ref 0.0–30.0)

## 2012-10-20 MED ORDER — TETANUS-DIPHTH-ACELL PERTUSSIS 5-2.5-18.5 LF-MCG/0.5 IM SUSP: 0.5000 mL | Freq: Once | INTRAMUSCULAR | Status: DC

## 2012-10-20 NOTE — Patient Instructions (Signed)
Your  Oxygen level is low during the day with activity.  You need to wear your supplemental oxygen during the day  We will order a night time pulse oximetry to see if you need to wear it at night  Please take your enalapril and your metoprolol at the same time once daily in the morning.    Please increase your use of the Advair to two times daily .  It only lasts 12 hours so you must use it twice daily   You need the TDaP vaccine,  An eye exam and a podiatry referral.Please let me know who Bev saw.

## 2012-10-20 NOTE — Progress Notes (Signed)
Patient ID: Terry [name], male   DOB: December 17, 1933, 79 y.o.   MRN: [id number]  Patient Active Problem List   Diagnosis Date Noted  . Tubulovillous adenoma of colon 06/28/2011    Priority: High  . Dyspnea and respiratory abnormality 10/02/2012  . Other malaise and fatigue 10/02/2012  . Hypoxemia 10/02/2012  . Ear lesion 06/12/2012  . COPD, moderate 12/22/2011  . Hyperlipidemia LDL goal < 70 06/28/2011  . Hypertension 06/28/2011  . Hyperthyroidism 06/28/2011  . History of esophagitis 06/28/2011  . Hip pain, acute, right 06/28/2011  . Alcohol abuse, unspecified 06/28/2011  . Diabetes mellitus type 2, noninsulin dependent   . Anemia     Subjective:  CC:   Chief Complaint  Patient presents with  . Follow-up    2 week    HPI:   Terry [name] a 79 y.o. male who presents for follow up on fatigue and hypoxia,  Patient diagnosed with severe COPD with ambulatory hypoxemia  last month after presenting with progressive disabling fatigue .  He was sent for PFTs, pulmonology evaluation,  Prescribed 02 and LABA.  He is not wearing 02 today.  He has not been wearing it at all during the day.  He does not have a portable compressor  Tank.  He has not been using the Advair twice daily either.  He does not take his medications regularly.    Past Medical History  Diagnosis Date  . Diabetes mellitus   . hyperthyroidism   . Hyperlipidemia   . Hypertension   . Anemia     Past Surgical History  Procedure Laterality Date  . Cataract extraction, bilateral  2012    Porfilio  . Colonoscopy w/ polypectomy  2011  . Appendectomy  1970       The following portions of the patient's history were reviewed and updated as appropriate: Allergies, current medications, and problem list.    Review of Systems:   12 Pt  review of systems was negative except those addressed in the HPI,     History   Social History  . Marital Status: Married    Spouse Name: N/A    Number of Children: N/A  .  Years of Education: N/A   Occupational History  . Not on file.   Social History Main Topics  . Smoking status: Former Smoker    Types: Cigarettes    Quit date: 06/26/1999  . Smokeless tobacco: Never Used  . Alcohol Use: 15.0 oz/week    25 Glasses of wine per week  . Drug Use: No  . Sexual Activity: Yes   Other Topics Concern  . Not on file   Social History Narrative  . No narrative on file    Objective:  Filed Vitals:   10/20/12 1004  BP: 168/82  Pulse: 78  Temp: 98 F (36.7 C)  Resp: 14     General appearance: alert, cooperative and appears stated age Ears: normal TM's and external ear canals both ears Throat: lips, mucosa, and tongue normal; teeth and gums normal Neck: no adenopathy, no carotid bruit, supple, symmetrical, trachea midline and thyroid not enlarged, symmetric, no tenderness/mass/nodules Back: symmetric, no curvature. ROM normal. No CVA tenderness. Lungs: clear to auscultation bilaterally Heart: regular rate and rhythm, S1, S2 normal, no murmur, click, rub or gallop Abdomen: soft, non-tender; bowel sounds normal; no masses,  no organomegaly Pulses: 2+ and symmetric Skin: Skin color, texture, turgor normal. No rashes or lesions Lymph nodes: Cervical, supraclavicular, and axillary nodes normal.  Assessment and Plan:  Diabetes mellitus type 2, noninsulin dependent a1c tis 6.5 today and will be forwarded to Dr. Renae Fickle  COPD, moderate With ambulatory hypoxia and dyspnea with exertion. Has been wearin -2 only at night.  Nocturnal hypoxia has not been determined yet.  Portable tank ordered.  Proper use of Advair use described   Updated Medication List Outpatient Encounter Prescriptions as of 10/20/2012  Medication Sig Dispense Refill  . b complex vitamins tablet Take 1 tablet by mouth daily.      . Cholecalciferol (VITAMIN D3) 1000 UNITS CAPS Take by mouth.      Marland Kitchen CINNAMON PO Take by mouth.      . cyanocobalamin 1000 MCG tablet Take 1,000 mcg by mouth  daily.      . enalapril (VASOTEC) 20 MG tablet Take 1 tablet (20 mg total) by mouth daily.  30 tablet  12  . Fluticasone-Salmeterol (ADVAIR DISKUS) 250-50 MCG/DOSE AEPB Inhale 1 puff into the lungs 2 (two) times daily.  28 each  0  . glipiZIDE (GLUCOTROL XL) 2.5 MG 24 hr tablet TAKE 1 TABLET BY MOUTH EVERY DAY  30 tablet  6  . Ipratropium-Albuterol (COMBIVENT) 20-100 MCG/ACT AERS respimat Inhale 1 puff into the lungs every 6 (six) hours as needed for wheezing.  4 g  11  . methimazole (TAPAZOLE) 5 MG tablet Take 2.5 mg by mouth daily.      . metoprolol succinate (TOPROL-XL) 50 MG 24 hr tablet TAKE 1 TABLET (50 MG TOTAL) BY MOUTH DAILY. TAKE WITH OR IMMEDIATELY FOLLOWING A MEAL.  90 tablet  5  . simvastatin (ZOCOR) 40 MG tablet TAKE 1 TABLET (40 MG TOTAL) BY MOUTH EVERY EVENING.  30 tablet  6  . TDaP (BOOSTRIX) 5-2.5-18.5 LF-MCG/0.5 injection Inject 0.5 mLs into the muscle once.  0.5 mL  0   No facility-administered encounter medications on file as of 10/20/2012.     Orders Placed This Encounter  Procedures  . Hemoglobin A1c  . Microalbumin / creatinine urine ratio  . Ambulatory referral to Ophthalmology  . Pulse oximetry, overnight  . HM DIABETES FOOT EXAM    No Follow-up on file.

## 2012-10-20 NOTE — Assessment & Plan Note (Addendum)
a1c tis 6.5 today and will be forwarded to Dr. Renae Fickle

## 2012-10-20 NOTE — Assessment & Plan Note (Signed)
With ambulatory hypoxia and dyspnea with exertion. Has been wearin -2 only at night.  Nocturnal hypoxia has not been determined yet.  Portable tank ordered.  Proper use of Advair use described

## 2012-10-21 ENCOUNTER — Institutional Professional Consult (permissible substitution): Payer: Medicare Other | Admitting: Pulmonary Disease

## 2012-10-22 ENCOUNTER — Encounter: Payer: Self-pay | Admitting: Internal Medicine

## 2012-10-22 ENCOUNTER — Ambulatory Visit (INDEPENDENT_AMBULATORY_CARE_PROVIDER_SITE_OTHER): Payer: Medicare Other | Admitting: General Surgery

## 2012-10-22 ENCOUNTER — Encounter: Payer: Self-pay | Admitting: General Surgery

## 2012-10-22 VITALS — BP 110/80 | HR 80 | Resp 12 | Ht 73.0 in | Wt 199.0 lb

## 2012-10-22 DIAGNOSIS — D126 Benign neoplasm of colon, unspecified: Secondary | ICD-10-CM

## 2012-10-22 NOTE — Progress Notes (Addendum)
Patient ID: Terry [name], male   DOB: [birth year], 79 y.o.   MRN: [id number]  Chief Complaint  Patient presents with  . Other    colonoscopy    HPI Terry [name] is a 79 y.o. male here today for an screening colonoscopy. Patient had an colonoscopy with polypectomy in 2011. No GI problems at this time.   HPI  Past Medical History  Diagnosis Date  . Diabetes mellitus   . hyperthyroidism   . Hyperlipidemia   . Hypertension   . Anemia     Past Surgical History  Procedure Laterality Date  . Cataract extraction, bilateral  2012    Porfilio  . Colonoscopy w/ polypectomy  2011    Dr. Drue Stager  . Appendectomy  1970    Family History  Problem Relation Age of Onset  . Diabetes Maternal Aunt   . Hyperlipidemia Maternal Uncle   . Birth defects Neg Hx     Social History History  Substance Use Topics  . Smoking status: Former Smoker    Types: Cigarettes    Quit date: 06/26/1999  . Smokeless tobacco: Never Used  . Alcohol Use: 15.0 oz/week    25 Glasses of wine per week    No Known Allergies  Current Outpatient Prescriptions  Medication Sig Dispense Refill  . b complex vitamins tablet Take 1 tablet by mouth daily.      . Cholecalciferol (VITAMIN D3) 1000 UNITS CAPS Take by mouth.      Marland Kitchen CINNAMON PO Take by mouth.      . cyanocobalamin 1000 MCG tablet Take 1,000 mcg by mouth daily.      . enalapril (VASOTEC) 20 MG tablet Take 1 tablet (20 mg total) by mouth daily.  30 tablet  12  . Fluticasone-Salmeterol (ADVAIR DISKUS) 250-50 MCG/DOSE AEPB Inhale 1 puff into the lungs 2 (two) times daily.  28 each  0  . glipiZIDE (GLUCOTROL XL) 2.5 MG 24 hr tablet TAKE 1 TABLET BY MOUTH EVERY DAY  30 tablet  6  . Ipratropium-Albuterol (COMBIVENT) 20-100 MCG/ACT AERS respimat Inhale 1 puff into the lungs every 6 (six) hours as needed for wheezing.  4 g  11  . methimazole (TAPAZOLE) 5 MG tablet Take 2.5 mg by mouth daily.      . metoprolol succinate (TOPROL-XL) 50 MG 24 hr tablet TAKE 1  TABLET (50 MG TOTAL) BY MOUTH DAILY. TAKE WITH OR IMMEDIATELY FOLLOWING A MEAL.  90 tablet  5  . simvastatin (ZOCOR) 40 MG tablet TAKE 1 TABLET (40 MG TOTAL) BY MOUTH EVERY EVENING.  30 tablet  6  . TDaP (BOOSTRIX) 5-2.5-18.5 LF-MCG/0.5 injection Inject 0.5 mLs into the muscle once.  0.5 mL  0   No current facility-administered medications for this visit.    Review of Systems Review of Systems  Constitutional: Negative.   Respiratory: Negative.   Cardiovascular: Negative.     Blood pressure 110/80, pulse 80, resp. rate 12, height 6\' 1"  (1.854 m), weight 199 lb (90.266 kg).  Physical Exam Physical Exam  Constitutional: He is oriented to person, place, and time. He appears well-nourished.  Cardiovascular: Normal rate, regular rhythm and normal heart sounds.   Pulmonary/Chest: Effort normal and breath sounds normal.  Abdominal: Bowel sounds are normal.  Neurological: He is alert and oriented to person, place, and time.  Skin: Skin is warm and dry.    Data Reviewed  The patient underwent a colonoscopy and upper endoscopy by Dr. Skeet Simmer for anemia in August 2011.  The upper endoscopy showed some mild gastritis, biopsies negative for H. pylori. There was a flat tubular adenoma in the ascending colon partially resected. A tubulovillous adenoma with focal high-grade dysplasia without evidence of dysplasia at the cauterized margin was removed from the distal descending colon at 45 cm.   Assessment    Tubulovillous adenoma of the descending colon with high-grade dysplasia.    Plan    The role of repeat colonoscopy to his assess for recurrent polyps was reviewed with the patient. He is unconvinced at this time that a repeat exam as needed. The risk of colon cancer was reviewed. The patient will notify the office would like to proceed with a follow up exam.     Patient to call the office when ready to arrange colonoscopy.   Earline Mayotte 10/23/2012, 6:04 PM

## 2012-10-22 NOTE — Patient Instructions (Addendum)
Colonoscopy A colonoscopy is an exam to evaluate your entire colon. In this exam, your colon is cleansed. A long fiberoptic tube is inserted through your rectum and into your colon. The fiberoptic scope (endoscope) is a long bundle of enclosed and very flexible fibers. These fibers transmit light to the area examined and send images from that area to your caregiver. Discomfort is usually minimal. You may be given a drug to help you sleep (sedative) during or prior to the procedure. This exam helps to detect lumps (tumors), polyps, inflammation, and areas of bleeding. Your caregiver may also take a small piece of tissue (biopsy) that will be examined under a microscope. LET YOUR CAREGIVER KNOW ABOUT:   Allergies to food or medicine.  Medicines taken, including vitamins, herbs, eyedrops, over-the-counter medicines, and creams.  Use of steroids (by mouth or creams).  Previous problems with anesthetics or numbing medicines.  History of bleeding problems or blood clots.  Previous surgery.  Other health problems, including diabetes and kidney problems.  Possibility of pregnancy, if this applies. BEFORE THE PROCEDURE   A clear liquid diet may be required for 2 days before the exam.  Ask your caregiver about changing or stopping your regular medications.  Liquid injections (enemas) or laxatives may be required.  A large amount of electrolyte solution may be given to you to drink over a short period of time. This solution is used to clean out your colon.  You should be present 60 minutes prior to your procedure or as directed by your caregiver. AFTER THE PROCEDURE   If you received a sedative or pain relieving medication, you will need to arrange for someone to drive you home.  Occasionally, there is a little blood passed with the first bowel movement. Do not be concerned. FINDING OUT THE RESULTS OF YOUR TEST Not all test results are available during your visit. If your test results are  not back during the visit, make an appointment with your caregiver to find out the results. Do not assume everything is normal if you have not heard from your caregiver or the medical facility. It is important for you to follow up on all of your test results. HOME CARE INSTRUCTIONS   It is not unusual to pass moderate amounts of gas and experience mild abdominal cramping following the procedure. This is due to air being used to inflate your colon during the exam. Walking or a warm pack on your belly (abdomen) may help.  You may resume all normal meals and activities after sedatives and medicines have worn off.  Only take over-the-counter or prescription medicines for pain, discomfort, or fever as directed by your caregiver. Do not use aspirin or blood thinners if a biopsy was taken. Consult your caregiver for medicine usage if biopsies were taken. SEEK IMMEDIATE MEDICAL CARE IF:   You have a fever.  You pass large blood clots or fill a toilet with blood following the procedure. This may also occur 10 to 14 days following the procedure. This is more likely if a biopsy was taken.  You develop abdominal pain that keeps getting worse and cannot be relieved with medicine. Document Released: 01/13/2000 Document Revised: 04/09/2011 Document Reviewed: 08/28/2007 [facility] Patient Information 2014 [place], [place].  Patient to call the office when ready to arrange colonoscopy.

## 2012-10-24 ENCOUNTER — Other Ambulatory Visit: Payer: Self-pay

## 2012-10-24 DIAGNOSIS — Z1211 Encounter for screening for malignant neoplasm of colon: Secondary | ICD-10-CM

## 2012-10-24 MED ORDER — POLYETHYLENE GLYCOL 3350 17 GM/SCOOP PO POWD: 17.0000 g | Freq: Every day | ORAL | Status: DC

## 2012-10-24 NOTE — Progress Notes (Signed)
Patient called and scheduled his colonoscopy for November 12th with Dr. Lemar Livings. Patient has instructions and is aware of his preparation for this. Miralax powder prescription sent into his pharmacy. Patient will call if he has any questions, or has a change with his medical health or medications.

## 2012-10-24 NOTE — Telephone Encounter (Signed)
Mailed unread message to pt  

## 2012-10-27 ENCOUNTER — Telehealth: Payer: Self-pay | Admitting: *Deleted

## 2012-10-27 NOTE — Telephone Encounter (Signed)
Patient will need to hold glipizide day of colonoscopy prep and procedure. Wife verbalizes understanding. Patient will be contacted prior to November colonoscopy to verify no medication changes.

## 2012-10-30 ENCOUNTER — Encounter: Payer: Self-pay | Admitting: Internal Medicine

## 2012-11-02 ENCOUNTER — Other Ambulatory Visit: Payer: Self-pay | Admitting: General Surgery

## 2012-11-02 DIAGNOSIS — D126 Benign neoplasm of colon, unspecified: Secondary | ICD-10-CM

## 2012-11-03 ENCOUNTER — Other Ambulatory Visit: Payer: Self-pay | Admitting: Internal Medicine

## 2012-11-07 LAB — HM DIABETES EYE EXAM

## 2012-11-11 ENCOUNTER — Encounter: Payer: Self-pay | Admitting: Pulmonary Disease

## 2012-11-11 ENCOUNTER — Ambulatory Visit (INDEPENDENT_AMBULATORY_CARE_PROVIDER_SITE_OTHER): Payer: Medicare Other | Admitting: Pulmonary Disease

## 2012-11-11 VITALS — BP 146/62 | HR 82 | Temp 98.5°F | Ht 73.0 in | Wt 197.0 lb

## 2012-11-11 DIAGNOSIS — J449 Chronic obstructive pulmonary disease, unspecified: Secondary | ICD-10-CM

## 2012-11-11 DIAGNOSIS — R0609 Other forms of dyspnea: Secondary | ICD-10-CM

## 2012-11-11 DIAGNOSIS — R0600 Dyspnea, unspecified: Secondary | ICD-10-CM

## 2012-11-11 DIAGNOSIS — Z0000 Encounter for general adult medical examination without abnormal findings: Secondary | ICD-10-CM | POA: Diagnosis not present

## 2012-11-11 MED ORDER — FLUTICASONE-SALMETEROL 250-50 MCG/DOSE IN AEPB: 1.0000 | INHALATION_SPRAY | Freq: Two times a day (BID) | RESPIRATORY_TRACT | Status: DC

## 2012-11-11 NOTE — Assessment & Plan Note (Addendum)
COPD: GOLD Grade A Combined recommendations from the [employer]	[place], [employer], Designer, television/film set, European Respiratory Society (Qaseem A et al, Ann Intern Med. 2011;155(3):179) recommends tobacco cessation, pulmonary rehab (for symptomatic patients with an FEV1 < 50% predicted), supplemental oxygen (for patients with SaO2 <88% or paO2 <55), and appropriate bronchodilator therapy.  In regards to long acting bronchodilators, they recommend monotherapy (FEV1 60-80% with symptoms weak evidence, FEV1 with symptoms <60% strong evidence), or combination therapy (FEV1 <60% with symptoms, strong recommendation, moderate evidence).  One should also provide patients with annual immunizations and consider therapy for prevention of COPD exacerbations (ie. roflumilast or azithromycin) when appopriate.  -O2 therapy: Each bedtime only, in the office today he stayed above 88% for the vast majority of the time that he was ambulating. I advised that he continue to check it while he is at work and exerting himself to make sure that it stays above 88%. If this is the case then he doesn't necessarily need to use the oxygen with exertion right now, however I fully expect that he will need it around the clock with in the next 3-5 years. -Immunizations: Up to date -Tobacco use: Quit ten years ago -Exercise: Encouraged to stay active -Bronchodilator therapy: Regular combivent (minimum 2/day, up to 4x/day) OK for now, if symptoms worsen then use Advair regularly -Exacerbation prevention: n/a

## 2012-11-11 NOTE — Progress Notes (Signed)
Subjective:    Patient ID: Terry [name], male    DOB: 1933/12/10, 79 y.o.   MRN: [id number]  HPI  This is a very pleasant 79 year old male who comes to our clinic today for evaluation and management of COPD. He states that he was feeling poorly approximately 4-6 weeks ago and so at his wife's prompting he came to see Dr. Darrick Huntsman. Specifically, he noticed increasing shortness of breath with heavy activity. This is been a problem throughout the course of the last year  and occurs primarily when he is at work. He works in a [employer] where walking around, climbing ladders, and carrying light objects is not a problem, however if he has to lift heavy objects it makes him quite short of breath. He typically has to stop and rest for a few minutes in order to catch his breath. 46 weeks ago he had a cough productive of green mucus and so he saw Dr. Darrick Huntsman has started Combivent. She also prescribed Advair. Since then he has been using only the Combivent a few times a day and he says that it has helped. His cough has almost completely resolved. He was also found to have hypoxemia during that visit and was told to use oxygen at 2 L per minute. His daughter purchased a pulse oximeter for him to measure his oxygen saturation at home. He says that he has checked it frequently while at work and while active and it is always in the mid 90s. He sleeps with the oxygen at night. He has not had an overnight oximetry test yet.  He has not had recurrent bronchitis over the years. He smoked as much as 2 packs of cigarettes daily for about 50 years. He quit 10-11 years ago.   Past Medical History  Diagnosis Date  . Diabetes mellitus   . hyperthyroidism   . Hyperlipidemia   . Hypertension   . Anemia      Family History  Problem Relation Age of Onset  . Diabetes Maternal Aunt   . Hyperlipidemia Maternal Uncle   . Birth defects Neg Hx      History   Social History  . Marital Status: Married    Spouse  Name: N/A    Number of Children: N/A  . Years of Education: N/A   Occupational History  . Not on file.   Social History Main Topics  . Smoking status: Former Smoker -- 2.00 packs/day for 50 years    Types: Cigarettes    Quit date: 06/26/1999  . Smokeless tobacco: Never Used  . Alcohol Use: 15.0 oz/week    25 Glasses of wine per week  . Drug Use: No  . Sexual Activity: Yes   Other Topics Concern  . Not on file   Social History Narrative  . No narrative on file     No Known Allergies   Outpatient Prescriptions Prior to Visit  Medication Sig Dispense Refill  . b complex vitamins tablet Take 1 tablet by mouth daily.      . Cholecalciferol (VITAMIN D3) 1000 UNITS CAPS Take by mouth.      Marland Kitchen CINNAMON PO Take by mouth.      . cyanocobalamin 1000 MCG tablet Take 1,000 mcg by mouth daily.      . enalapril (VASOTEC) 20 MG tablet Take 1 tablet (20 mg total) by mouth daily.  30 tablet  12  . glipiZIDE (GLUCOTROL XL) 2.5 MG 24 hr tablet TAKE 1 TABLET BY MOUTH  EVERY DAY  30 tablet  6  . Ipratropium-Albuterol (COMBIVENT) 20-100 MCG/ACT AERS respimat Inhale 1 puff into the lungs every 6 (six) hours as needed for wheezing.  4 g  11  . methimazole (TAPAZOLE) 5 MG tablet Take 2.5 mg by mouth daily.      . metoprolol succinate (TOPROL-XL) 50 MG 24 hr tablet TAKE 1 TABLET (50 MG TOTAL) BY MOUTH DAILY. TAKE WITH OR IMMEDIATELY FOLLOWING A MEAL.  90 tablet  5  . simvastatin (ZOCOR) 40 MG tablet TAKE 1 TABLET BY MOUTH AT BEDTIME  30 tablet  5  . Fluticasone-Salmeterol (ADVAIR DISKUS) 250-50 MCG/DOSE AEPB Inhale 1 puff into the lungs 2 (two) times daily.  28 each  0  . polyethylene glycol powder (GLYCOLAX/MIRALAX) powder Take 17 g by mouth daily.  255 g  0  . TDaP (BOOSTRIX) 5-2.5-18.5 LF-MCG/0.5 injection Inject 0.5 mLs into the muscle once.  0.5 mL  0   No facility-administered medications prior to visit.      Review of Systems  Constitutional: Negative for fever, chills, activity change and  appetite change.  HENT: Negative for congestion, ear pain, hearing loss, postnasal drip, rhinorrhea, sinus pressure and sneezing.   Eyes: Negative for redness, itching and visual disturbance.  Respiratory: Positive for shortness of breath. Negative for cough, chest tightness and wheezing.   Cardiovascular: Negative for chest pain, palpitations and leg swelling.  Gastrointestinal: Negative for nausea, vomiting, abdominal pain, diarrhea, constipation, blood in stool and abdominal distention.  Musculoskeletal: Negative for arthralgias, gait problem, joint swelling, myalgias, neck pain and neck stiffness.  Skin: Negative for rash.  Neurological: Negative for dizziness, light-headedness, numbness and headaches.  Hematological: Does not bruise/bleed easily.  Psychiatric/Behavioral: Negative for confusion and dysphoric mood.       Objective:   Physical Exam   Filed Vitals:   11/11/12 1438  BP: 146/62  Pulse: 82  Temp: 98.5 F (36.9 C)  TempSrc: Oral  Height: 6\' 1"  (1.854 m)  Weight: 197 lb (89.359 kg)  SpO2: 92%   Walked 525feet in the office, briefly dropped to 85% for < ten seconds and then recovered, by the end of the walk O2 saturation was 91% RA  Gen: well appearing, no acute distress HEENT: NCAT, PERRL, EOMi, OP clear, neck supple without masses PULM: diminished breath sounds upper lobes bilaterally, normal percussion CV: RRR, no mgr, no JVD AB: BS+, soft, nontender, no hsm Ext: warm, no edema, no clubbing, no cyanosis Derm: no rash or skin breakdown Neuro: A&Ox4, CN II-XII intact, strength 5/5 in all 4 extremities  FVC  4.43 L 96% of predicted FEV1  2.14 L 71% predicted FEV1/FVC 48% predicted VC 4.45 L  96% predicted RV 3.29 L  115% predicted TLC  7.74 L 109% predicted Diffusion 48  percent predicted Mild to moderate obstructive airway disease without significant bronchodilator response with evidence of emphysema along with air trapping and hyperinflation.  Belia Heman, [facility]   10/14/12)  10/02/2012 CXR> bilateral emphysema, no infiltratre      Assessment & Plan:   COPD, moderate COPD: GOLD Grade A Combined recommendations from the [employer], [employer], Designer, television/film set, European Respiratory Society (Qaseem A et al, Ann Intern Med. 2011;155(3):179) recommends tobacco cessation, pulmonary rehab (for symptomatic patients with an FEV1 < 50% predicted), supplemental oxygen (for patients with SaO2 <88% or paO2 <55), and appropriate bronchodilator therapy.  In regards to long acting bronchodilators, they recommend monotherapy (FEV1 60-80% with symptoms weak evidence,  FEV1 with symptoms <60% strong evidence), or combination therapy (FEV1 <60% with symptoms, strong recommendation, moderate evidence).  One should also provide patients with annual immunizations and consider therapy for prevention of COPD exacerbations (ie. roflumilast or azithromycin) when appopriate.  -O2 therapy: Each bedtime only, in the office today he stayed above 88% for the vast majority of the time that he was ambulating. I advised that he continue to check it while he is at work and exerting himself to make sure that it stays above 88%. If this is the case then he doesn't necessarily need to use the oxygen with exertion right now, however I fully expect that he will need it around the clock with in the next 3-5 years. -Immunizations: Up to date -Tobacco use: Quit ten years ago -Exercise: Encouraged to stay active -Bronchodilator therapy: Regular combivent (minimum 2/day, up to 4x/day) OK for now, if symptoms worsen then use Advair regularly -Exacerbation prevention: n/a   Health care maintenance I recommended that he undergo lung cancer screening. I provided him with information regarding this and he will think about it.    Updated Medication List Outpatient Encounter Prescriptions as of 11/11/2012  Medication Sig Dispense Refill  . b complex  vitamins tablet Take 1 tablet by mouth daily.      . Cholecalciferol (VITAMIN D3) 1000 UNITS CAPS Take by mouth.      Marland Kitchen CINNAMON PO Take by mouth.      . cyanocobalamin 1000 MCG tablet Take 1,000 mcg by mouth daily.      . enalapril (VASOTEC) 20 MG tablet Take 1 tablet (20 mg total) by mouth daily.  30 tablet  12  . glipiZIDE (GLUCOTROL XL) 2.5 MG 24 hr tablet TAKE 1 TABLET BY MOUTH EVERY DAY  30 tablet  6  . Ipratropium-Albuterol (COMBIVENT) 20-100 MCG/ACT AERS respimat Inhale 1 puff into the lungs every 6 (six) hours as needed for wheezing.  4 g  11  . methimazole (TAPAZOLE) 5 MG tablet Take 2.5 mg by mouth daily.      . metoprolol succinate (TOPROL-XL) 50 MG 24 hr tablet TAKE 1 TABLET (50 MG TOTAL) BY MOUTH DAILY. TAKE WITH OR IMMEDIATELY FOLLOWING A MEAL.  90 tablet  5  . simvastatin (ZOCOR) 40 MG tablet TAKE 1 TABLET BY MOUTH AT BEDTIME  30 tablet  5  . Fluticasone-Salmeterol (ADVAIR DISKUS) 250-50 MCG/DOSE AEPB Inhale 1 puff into the lungs 2 (two) times daily.  28 each  5  . [DISCONTINUED] Fluticasone-Salmeterol (ADVAIR DISKUS) 250-50 MCG/DOSE AEPB Inhale 1 puff into the lungs 2 (two) times daily.  28 each  0  . [DISCONTINUED] polyethylene glycol powder (GLYCOLAX/MIRALAX) powder Take 17 g by mouth daily.  255 g  0  . [DISCONTINUED] TDaP (BOOSTRIX) 5-2.5-18.5 LF-MCG/0.5 injection Inject 0.5 mLs into the muscle once.  0.5 mL  0   No facility-administered encounter medications on file as of 11/11/2012.

## 2012-11-11 NOTE — Patient Instructions (Signed)
Keep using your combivent as you are doing Keep using your oxygen as you are doing If you have increasing shortness of breath then use the Advair twice a day We will arrange an oxygen saturation test in your home, do not wear oxygen during this test

## 2012-11-12 ENCOUNTER — Encounter: Payer: Self-pay | Admitting: Pulmonary Disease

## 2012-11-12 DIAGNOSIS — Z0000 Encounter for general adult medical examination without abnormal findings: Secondary | ICD-10-CM | POA: Insufficient documentation

## 2012-11-12 NOTE — Assessment & Plan Note (Signed)
I recommended that he undergo lung cancer screening. I provided him with information regarding this and he will think about it.

## 2012-11-14 DIAGNOSIS — R0609 Other forms of dyspnea: Secondary | ICD-10-CM | POA: Diagnosis not present

## 2012-11-18 ENCOUNTER — Encounter: Payer: Self-pay | Admitting: Pulmonary Disease

## 2012-11-18 ENCOUNTER — Telehealth: Payer: Self-pay | Admitting: *Deleted

## 2012-11-18 NOTE — Telephone Encounter (Signed)
Pt's wife is aware of the results of the ONO. Stated that she will make sure the pt uses his O2 QHS.

## 2012-11-18 NOTE — Telephone Encounter (Signed)
Message copied by Caryl Ada on Tue Nov 18, 2012  9:17 AM ------      Message from: Max Fickle B      Created: Tue Nov 18, 2012  9:15 AM       L,            Please let him know that his ONO showed that he really needs to be using his oxygen at night.            Thanks,      B ------

## 2012-11-20 DIAGNOSIS — E119 Type 2 diabetes mellitus without complications: Secondary | ICD-10-CM | POA: Diagnosis not present

## 2012-12-04 ENCOUNTER — Telehealth: Payer: Self-pay | Admitting: *Deleted

## 2012-12-04 NOTE — Telephone Encounter (Signed)
Per patient's wife, patient's medications have not changed since last office visit. He has already pre-registered and has Miralax prescription. We will proceed with colonoscopy that is scheduled at [facility] for 12-10-12. They will call the office if they have further questions.

## 2012-12-10 ENCOUNTER — Ambulatory Visit: Payer: Self-pay | Admitting: General Surgery

## 2012-12-10 DIAGNOSIS — D129 Benign neoplasm of anus and anal canal: Secondary | ICD-10-CM | POA: Diagnosis not present

## 2012-12-10 DIAGNOSIS — D128 Benign neoplasm of rectum: Secondary | ICD-10-CM | POA: Diagnosis not present

## 2012-12-10 DIAGNOSIS — D126 Benign neoplasm of colon, unspecified: Secondary | ICD-10-CM | POA: Diagnosis not present

## 2012-12-10 HISTORY — PX: COLONOSCOPY: SHX174

## 2012-12-11 ENCOUNTER — Encounter: Payer: Self-pay | Admitting: General Surgery

## 2012-12-11 LAB — PATHOLOGY REPORT

## 2012-12-12 ENCOUNTER — Inpatient Hospital Stay: Payer: Self-pay | Admitting: Internal Medicine

## 2012-12-12 DIAGNOSIS — K625 Hemorrhage of anus and rectum: Secondary | ICD-10-CM | POA: Diagnosis not present

## 2012-12-12 DIAGNOSIS — D649 Anemia, unspecified: Secondary | ICD-10-CM | POA: Diagnosis not present

## 2012-12-12 DIAGNOSIS — R079 Chest pain, unspecified: Secondary | ICD-10-CM | POA: Insufficient documentation

## 2012-12-12 LAB — URINALYSIS, COMPLETE
Bacteria: NONE SEEN
Bilirubin,UR: NEGATIVE
Blood: NEGATIVE
Hyaline Cast: 13
Leukocyte Esterase: NEGATIVE
Nitrite: NEGATIVE
Protein: NEGATIVE
Squamous Epithelial: 1
WBC UR: <1 /HPF (ref 0–5)

## 2012-12-12 LAB — CBC
HCT: 31.7 % — ABNORMAL LOW (ref 40.0–52.0)
HGB: 10.7 g/dL — ABNORMAL LOW (ref 13.0–18.0)
MCH: 32 pg (ref 26.0–34.0)
MCHC: 33.6 g/dL (ref 32.0–36.0)
MCV: 95 fL (ref 80–100)
RBC: 3.33 10*6/uL — ABNORMAL LOW (ref 4.40–5.90)
RDW: 13.9 % (ref 11.5–14.5)
WBC: 5.9 10*3/uL (ref 3.8–10.6)

## 2012-12-12 LAB — COMPREHENSIVE METABOLIC PANEL
Albumin: 3.2 g/dL — ABNORMAL LOW (ref 3.4–5.0)
Alkaline Phosphatase: 62 U/L (ref 50–136)
Anion Gap: 7 (ref 7–16)
Bilirubin,Total: 0.4 mg/dL (ref 0.2–1.0)
Calcium, Total: 8.5 mg/dL (ref 8.5–10.1)
Co2: 23 mmol/L (ref 21–32)
EGFR (Non-African Amer.): 56 — ABNORMAL LOW
Glucose: 175 mg/dL — ABNORMAL HIGH (ref 65–99)
Osmolality: 284 (ref 275–301)
Potassium: 4.1 mmol/L (ref 3.5–5.1)
SGOT(AST): 24 U/L (ref 15–37)
Total Protein: 6.4 g/dL (ref 6.4–8.2)

## 2012-12-12 LAB — PROTIME-INR
INR: 0.9
Prothrombin Time: 12.8 secs (ref 11.5–14.7)

## 2012-12-12 LAB — TROPONIN I: Troponin-I: <0.02 ng/mL

## 2012-12-12 LAB — HEMATOCRIT: HCT: 31.4 % — ABNORMAL LOW (ref 40.0–52.0)

## 2012-12-12 LAB — CK TOTAL AND CKMB (NOT AT ARMC): CK, Total: 102 U/L (ref 35–232)

## 2012-12-12 LAB — HEMOGLOBIN: HGB: 10.6 g/dL — ABNORMAL LOW (ref 13.0–18.0)

## 2012-12-13 DIAGNOSIS — K625 Hemorrhage of anus and rectum: Secondary | ICD-10-CM | POA: Diagnosis not present

## 2012-12-13 DIAGNOSIS — D649 Anemia, unspecified: Secondary | ICD-10-CM | POA: Diagnosis not present

## 2012-12-13 LAB — BASIC METABOLIC PANEL
BUN: 16 mg/dL (ref 7–18)
Chloride: 112 mmol/L — ABNORMAL HIGH (ref 98–107)
EGFR (African American): >60
EGFR (Non-African Amer.): 57 — ABNORMAL LOW
Osmolality: 282 (ref 275–301)
Potassium: 4.1 mmol/L (ref 3.5–5.1)

## 2012-12-13 LAB — CBC WITH DIFFERENTIAL/PLATELET
Basophil #: 0 10*3/uL (ref 0.0–0.1)
Lymphocyte #: 1.9 10*3/uL (ref 1.0–3.6)
Lymphocyte %: 24.2 %
MCHC: 34.3 g/dL (ref 32.0–36.0)
Monocyte #: 1 x10 3/mm (ref 0.2–1.0)
Neutrophil #: 4.8 10*3/uL (ref 1.4–6.5)
Neutrophil %: 61.3 %
Platelet: 202 10*3/uL (ref 150–440)
RBC: 2.75 10*6/uL — ABNORMAL LOW (ref 4.40–5.90)
RDW: 13.9 % (ref 11.5–14.5)

## 2012-12-15 ENCOUNTER — Telehealth: Payer: Self-pay | Admitting: Internal Medicine

## 2012-12-15 ENCOUNTER — Telehealth: Payer: Self-pay | Admitting: General Surgery

## 2012-12-15 ENCOUNTER — Encounter: Payer: Self-pay | Admitting: General Surgery

## 2012-12-15 NOTE — Telephone Encounter (Signed)
Results of hospitalization November 14-16, 2014 reviewed. Black stools, slightly atypical for a colonic bleed.  Wife reports he had a small bowel movement yesterday with a trace of dark blood. Outside of the week, he is otherwise well.  Notified the pathology did not show evidence of malignancy.  This is a very sizable polyp, and only a portion was removed. The patient may better be served by right hemicolectomy rather than her current polypectomy attempts, especially in light of recent bleeding episode.  Followup in office 12/17/2012 as scheduled.

## 2012-12-15 NOTE — Telephone Encounter (Signed)
Calling to inform Dr. Darrick Huntsman that pt had colonoscopy 11/12, began bleeding 11/14, went to ER and was admitted 11/14.  Discharged 11/16.  Did not wish to schedule f/u at this time.  Would like a call.

## 2012-12-15 NOTE — Telephone Encounter (Signed)
Scheduled patient for Hospital follow up on 12/18/12 patient stated he is better but feels weak from loss of blood. Patient is following up with surgeon. Faxed for discharge Summary. FYI

## 2012-12-17 ENCOUNTER — Encounter: Payer: Self-pay | Admitting: *Deleted

## 2012-12-17 ENCOUNTER — Ambulatory Visit (INDEPENDENT_AMBULATORY_CARE_PROVIDER_SITE_OTHER): Payer: Self-pay | Admitting: General Surgery

## 2012-12-17 ENCOUNTER — Encounter: Payer: Self-pay | Admitting: General Surgery

## 2012-12-17 VITALS — BP 116/60 | HR 72 | Resp 14 | Ht 73.0 in | Wt 197.0 lb

## 2012-12-17 DIAGNOSIS — D649 Anemia, unspecified: Secondary | ICD-10-CM | POA: Diagnosis not present

## 2012-12-17 DIAGNOSIS — K922 Gastrointestinal hemorrhage, unspecified: Secondary | ICD-10-CM

## 2012-12-17 DIAGNOSIS — Z86010 Personal history of colonic polyps: Secondary | ICD-10-CM

## 2012-12-17 NOTE — Patient Instructions (Addendum)
The patient is aware to call back for any questions or concerns. colonoscopy in 2 years

## 2012-12-17 NOTE — Progress Notes (Signed)
Patient ID: Terry [name], male   DOB: Oct 11, 1933, 79 y.o.   MRN: [id number]  Chief Complaint  Patient presents with  . Routine Post Op    colonoscopy    HPI Terry [name] is a 79 y.o. male  here today for follow up colonoscopy done 12-10-12. He was hospitalized for 3 days for bloody stools, no transfusions.  States he is feeling better now. Currently no blood in the stools. 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 He does feel that his exercise tolerance is down slightly from before his colonoscopy. He denies any shortness of breath or chest pain. HPI  Past Medical History  Diagnosis Date  . Diabetes mellitus   . hyperthyroidism   . Hyperlipidemia   . Hypertension   . Anemia     Past Surgical History  Procedure Laterality Date  . Cataract extraction, bilateral  2012    Porfilio  . Colonoscopy w/ polypectomy  2011    Dr. Drue Stager  . Appendectomy  1970  . Colonoscopy  12-10-12    Family History  Problem Relation Age of Onset  . Diabetes Maternal Aunt   . Hyperlipidemia Maternal Uncle   . Birth defects Neg Hx     Social History History  Substance Use Topics  . Smoking status: Former Smoker -- 2.00 packs/day for 50 years    Types: Cigarettes    Quit date: 06/26/1999  . Smokeless tobacco: Never Used  . Alcohol Use: 15.0 oz/week    25 Glasses of wine per week    No Known Allergies  Current Outpatient Prescriptions  Medication Sig Dispense Refill  . b complex vitamins tablet Take 1 tablet by mouth daily.      . Cholecalciferol (VITAMIN D3) 1000 UNITS CAPS Take by mouth.      Marland Kitchen CINNAMON PO Take by mouth.      . cyanocobalamin 1000 MCG tablet Take 1,000 mcg by mouth daily.      . enalapril (VASOTEC) 20 MG tablet Take 1 tablet (20 mg total) by mouth daily.  30 tablet  12  . Fluticasone-Salmeterol (ADVAIR DISKUS) 250-50 MCG/DOSE AEPB Inhale 1 puff into the lungs 2 (two) times daily.  28 each  5  . glipiZIDE (GLUCOTROL XL)  2.5 MG 24 hr tablet TAKE 1 TABLET BY MOUTH EVERY DAY  30 tablet  6  . Ipratropium-Albuterol (COMBIVENT) 20-100 MCG/ACT AERS respimat Inhale 1 puff into the lungs every 6 (six) hours as needed for wheezing.  4 g  11  . methimazole (TAPAZOLE) 5 MG tablet Take 2.5 mg by mouth daily.      . metoprolol succinate (TOPROL-XL) 50 MG 24 hr tablet TAKE 1 TABLET (50 MG TOTAL) BY MOUTH DAILY. TAKE WITH OR IMMEDIATELY FOLLOWING A MEAL.  90 tablet  5  . simvastatin (ZOCOR) 40 MG tablet TAKE 1 TABLET BY MOUTH AT BEDTIME  30 tablet  5   No current facility-administered medications for this visit.    Review of Systems Review of Systems  Constitutional: Negative.   Respiratory: Negative.   Cardiovascular: Negative.   Gastrointestinal: Negative for nausea and vomiting.    Blood pressure 116/60, pulse 72, resp. rate 14, height 6\' 1"  (1.854 m), weight 197 lb (89.359 kg).  Physical Exam Physical Exam  Constitutional: He is oriented to person, place, and time. He appears well-developed and well-nourished.  Cardiovascular: Normal rate, regular rhythm and normal heart sounds.   Pulmonary/Chest: Effort normal and breath sounds  normal.  Neurological: He is alert and oriented to person, place, and time.  Skin: Skin is warm and dry.    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 Previously resected polyps in the ascending colon and descending colon did not show evidence of recurrence.  Assessment    Doing well post colonoscopy, no evidence of ongoing bleeding.     Plan    A follow up exam in 2 years considering his frequent polyps during the last 2 exams was recommended. A followup CBC will be obtained today.        Terry [name] 12/17/2012, 8:27 PM

## 2012-12-18 ENCOUNTER — Encounter: Payer: Self-pay | Admitting: Internal Medicine

## 2012-12-18 ENCOUNTER — Ambulatory Visit (INDEPENDENT_AMBULATORY_CARE_PROVIDER_SITE_OTHER): Payer: Medicare Other | Admitting: Internal Medicine

## 2012-12-18 ENCOUNTER — Other Ambulatory Visit: Payer: Self-pay | Admitting: *Deleted

## 2012-12-18 ENCOUNTER — Telehealth: Payer: Self-pay | Admitting: *Deleted

## 2012-12-18 VITALS — BP 138/64 | HR 95 | Temp 98.1°F | Resp 14 | Wt 194.5 lb

## 2012-12-18 DIAGNOSIS — E119 Type 2 diabetes mellitus without complications: Secondary | ICD-10-CM | POA: Diagnosis not present

## 2012-12-18 DIAGNOSIS — D62 Acute posthemorrhagic anemia: Secondary | ICD-10-CM | POA: Diagnosis not present

## 2012-12-18 DIAGNOSIS — I10 Essential (primary) hypertension: Secondary | ICD-10-CM

## 2012-12-18 DIAGNOSIS — E0500 Thyrotoxicosis with diffuse goiter without thyrotoxic crisis or storm: Secondary | ICD-10-CM | POA: Diagnosis not present

## 2012-12-18 DIAGNOSIS — F1010 Alcohol abuse, uncomplicated: Secondary | ICD-10-CM

## 2012-12-18 DIAGNOSIS — J449 Chronic obstructive pulmonary disease, unspecified: Secondary | ICD-10-CM

## 2012-12-18 DIAGNOSIS — D649 Anemia, unspecified: Secondary | ICD-10-CM

## 2012-12-18 DIAGNOSIS — K922 Gastrointestinal hemorrhage, unspecified: Secondary | ICD-10-CM

## 2012-12-18 LAB — IRON AND TIBC
%SAT: 13 % — ABNORMAL LOW (ref 20–55)
Iron: 35 ug/dL — ABNORMAL LOW (ref 42–165)
TIBC: 269 ug/dL (ref 215–435)
UIBC: 234 ug/dL (ref 125–400)

## 2012-12-18 LAB — CBC
HCT: 28 % — ABNORMAL LOW (ref 37.5–51.0)
Hemoglobin: 9.2 g/dL — ABNORMAL LOW (ref 12.6–17.7)
MCH: 31 pg (ref 26.6–33.0)
MCHC: 32.9 g/dL (ref 31.5–35.7)
MCV: 94 fL (ref 79–97)
Platelets: 280 10*3/uL (ref 150–379)
RDW: 13.9 % (ref 12.3–15.4)

## 2012-12-18 LAB — FERRITIN: Ferritin: 147.1 ng/mL (ref 22.0–322.0)

## 2012-12-18 MED ORDER — GLIPIZIDE ER 2.5 MG PO TB24: ORAL_TABLET | ORAL | Status: DC

## 2012-12-18 MED ORDER — ZOSTER VACCINE LIVE 19400 UNT/0.65ML ~~LOC~~ SOLR: 0.6500 mL | Freq: Once | SUBCUTANEOUS | Status: DC

## 2012-12-18 MED ORDER — TETANUS-DIPHTH-ACELL PERTUSSIS 5-2.5-18.5 LF-MCG/0.5 IM SUSP: 0.5000 mL | Freq: Once | INTRAMUSCULAR | Status: DC

## 2012-12-18 NOTE — Patient Instructions (Addendum)
You do not need to use the oxygen if your ambulating or active 02 sats are > 90%  You should continue to sleep with the supplemental oxygen   You can stop the omeprazole .  It is for ulcers.     We may end up prescribing iron if your iron stores are low .  This will solidify your stools, but your "explosions: may be due to th foods you eat.:   1) Try a few drops for  Beano at the start of every meal. This supplies the enzyme needed to digest vegetables and legumes (beans)   2) Try Lactase  Tablet ,to take at any meal that has a milk product   I want to repeat your hemoglobin in 1 month (you do not need to see me; just return in a month for labs)

## 2012-12-18 NOTE — Progress Notes (Signed)
Patient ID: Terry [name], male   DOB: November 10, 1933, 79 y.o.   MRN: [id number]  Patient Active Problem List   Diagnosis Date Noted  . Tubulovillous adenoma of colon 06/28/2011    Priority: High  . Lower GI bleeding 12/17/2012  . Health care maintenance 11/12/2012  . Dyspnea and respiratory abnormality 10/02/2012  . Other malaise and fatigue 10/02/2012  . Hypoxemia 10/02/2012  . Ear lesion 06/12/2012  . COPD, moderate 12/22/2011  . Hyperlipidemia LDL goal < 70 06/28/2011  . Hypertension 06/28/2011  . Hyperthyroidism 06/28/2011  . History of esophagitis 06/28/2011  . Hip pain, acute, right 06/28/2011  . Alcohol abuse, unspecified 06/28/2011  . Diabetes mellitus type 2, noninsulin dependent   . Anemia     Subjective:  CC:   Chief Complaint  Patient presents with  . Follow-up    hospital bleeding after colonoscopy.    HPI:   Terry [name] a 79 y.o. male who presents for hospital follow up. Patient was admitted Nov 14 to 16th  after developing a lower GI bleed which occurred after having a 2 cm polyp removed during colonoscopy.  Hemglobin was noted to drop 3 pts.  He was observed for 48 hours and did not required transfusion.   After being a self described alcohol abuser he reports that he stopped drinking  alcohol for last week after feeling that he was treated as an alcoholic while hospitalized. He has had no withdrawal symptoms.  He reports that he has had diarrhea for a long time.  His stools are formed but has a ":blowout" at least once  A weeks,  Sometimes for a few days in a row  He has returned  to work ,  But has not resumed using his supplemental 02 .  He has  Been checking hs 02 saturations with a home pulse ox meter, and they have been around 92%.  He is wearing the 02 only at night , not when ambulating   Past Medical History  Diagnosis Date  . Diabetes mellitus   . hyperthyroidism   . Hyperlipidemia   . Hypertension   . Anemia     Past Surgical History   Procedure Laterality Date  . Cataract extraction, bilateral  2012    Porfilio  . Colonoscopy w/ polypectomy  2011    Dr. Drue Stager  . Appendectomy  1970  . Colonoscopy  12-10-12       The following portions of the patient's history were reviewed and updated as appropriate: Allergies, current medications, and problem list.    Review of Systems:   12 Pt  review of systems was negative except those addressed in the HPI,     History   Social History  . Marital Status: Married    Spouse Name: N/A    Number of Children: N/A  . Years of Education: N/A   Occupational History  . Not on file.   Social History Main Topics  . Smoking status: Former Smoker -- 2.00 packs/day for 50 years    Types: Cigarettes    Quit date: 06/26/1999  . Smokeless tobacco: Never Used  . Alcohol Use: 15.0 oz/week    25 Glasses of wine per week  . Drug Use: No  . Sexual Activity: Yes   Other Topics Concern  . Not on file   Social History Narrative  . No narrative on file    Objective:  Filed Vitals:   12/18/12 0903  BP: 138/64  Pulse: 95  Temp: 98.1 F (36.7 C)  Resp: 14     General appearance: alert, cooperative and appears stated age Ears: normal TM's and external ear canals both ears Throat: lips, mucosa, and tongue normal; teeth and gums normal Neck: no adenopathy, no carotid bruit, supple, symmetrical, trachea midline and thyroid not enlarged, symmetric, no tenderness/mass/nodules Back: symmetric, no curvature. ROM normal. No CVA tenderness. Lungs: clear to auscultation bilaterally Heart: regular rate and rhythm, S1, S2 normal, no murmur, click, rub or gallop Abdomen: soft, non-tender; bowel sounds normal; no masses,  no organomegaly Pulses: 2+ and symmetric Skin: Skin color, texture, turgor normal. No rashes or lesions Lymph nodes: Cervical, supraclavicular, and axillary nodes normal.  Assessment and Plan:  Alcohol abuse, unspecified He has stopped drinking after his  hospitalization for lower GI bleed Nov 2014 .   Anemia Acute on chronic , secondary to Gi bleed.  Repeat iron studies are normal. Lab Results  Component Value Date   IRON 35* 12/18/2012   TIBC 269 12/18/2012   FERRITIN 147.1 12/18/2012    COPD, moderate With previously noted hypoxia, now using 02 only at night.   Hypertension Well controlled on current regimen. Renal function stable, no changes today.  Lower GI bleeding Secondary to excision of 2 cm polyp. PT was normal, patient states that despite him abusing alcohol he does not bleed easily.  He did not require transfusion .    Updated Medication List Outpatient Encounter Prescriptions as of 12/18/2012  Medication Sig  . b complex vitamins tablet Take 1 tablet by mouth daily.  . Cholecalciferol (VITAMIN D3) 1000 UNITS CAPS Take by mouth.  Marland Kitchen CINNAMON PO Take by mouth.  . cyanocobalamin 1000 MCG tablet Take 1,000 mcg by mouth daily.  . enalapril (VASOTEC) 20 MG tablet Take 1 tablet (20 mg total) by mouth daily.  . Fluticasone-Salmeterol (ADVAIR DISKUS) 250-50 MCG/DOSE AEPB Inhale 1 puff into the lungs 2 (two) times daily.  . Ipratropium-Albuterol (COMBIVENT) 20-100 MCG/ACT AERS respimat Inhale 1 puff into the lungs every 6 (six) hours as needed for wheezing.  . methimazole (TAPAZOLE) 5 MG tablet Take 2.5 mg by mouth daily.  . metoprolol succinate (TOPROL-XL) 50 MG 24 hr tablet TAKE 1 TABLET (50 MG TOTAL) BY MOUTH DAILY. TAKE WITH OR IMMEDIATELY FOLLOWING A MEAL.  . simvastatin (ZOCOR) 40 MG tablet TAKE 1 TABLET BY MOUTH AT BEDTIME  . [DISCONTINUED] glipiZIDE (GLUCOTROL XL) 2.5 MG 24 hr tablet TAKE 1 TABLET BY MOUTH EVERY DAY  . TDaP (BOOSTRIX) 5-2.5-18.5 LF-MCG/0.5 injection Inject 0.5 mLs into the muscle once.  . zoster vaccine live, PF, (ZOSTAVAX) 40981 UNT/0.65ML injection Inject 19,400 Units into the skin once.     Orders Placed This Encounter  Procedures  . Ferritin  . Iron and TIBC    No Follow-up on file.

## 2012-12-18 NOTE — Telephone Encounter (Signed)
Notified patient wife as instructed, patient wife pleased. Hemoglobin is up to 9.2. Discussed follow-up appointments for 2 years unless he notices any bleeding.

## 2012-12-20 NOTE — Assessment & Plan Note (Signed)
With previously noted hypoxia, now using 02 only at night.

## 2012-12-20 NOTE — Assessment & Plan Note (Signed)
Secondary to excision of 2 cm polyp. PT was normal, patient states that despite him abusing alcohol he does not bleed easily.  He did not require transfusion .

## 2012-12-20 NOTE — Assessment & Plan Note (Signed)
Well controlled on current regimen. Renal function stable, no changes today. 

## 2012-12-20 NOTE — Assessment & Plan Note (Signed)
He has stopped drinking after his hospitalization for lower GI bleed Nov 2014 .

## 2012-12-20 NOTE — Assessment & Plan Note (Addendum)
Acute on chronic , secondary to Gi bleed.  Repeat iron studies are normal. Lab Results  Component Value Date   IRON 35* 12/18/2012   TIBC 269 12/18/2012   FERRITIN 147.1 12/18/2012

## 2012-12-21 ENCOUNTER — Encounter: Payer: Self-pay | Admitting: Internal Medicine

## 2012-12-23 ENCOUNTER — Encounter: Payer: Self-pay | Admitting: *Deleted

## 2012-12-23 ENCOUNTER — Telehealth: Payer: Self-pay | Admitting: *Deleted

## 2012-12-23 NOTE — Telephone Encounter (Signed)
Dr. Darrick Huntsman has placed him on some iron. Wife states Mr Mah appetite is improving and he is feelings "better" overall.  Mr Lienhard was at work. She will talk with him and see if he has any further questions that we may can answer for him.

## 2013-01-05 ENCOUNTER — Telehealth: Payer: Self-pay | Admitting: Internal Medicine

## 2013-01-05 MED ORDER — ENALAPRIL MALEATE 20 MG PO TABS: 20.0000 mg | ORAL_TABLET | Freq: Every day | ORAL | Status: DC

## 2013-01-05 NOTE — Telephone Encounter (Signed)
Rx sent to pharmacy by escript  

## 2013-01-05 NOTE — Telephone Encounter (Signed)
Left vm.  Needs 90 day supply enalapril 20 mg called to [facility].

## 2013-01-12 DIAGNOSIS — K922 Gastrointestinal hemorrhage, unspecified: Secondary | ICD-10-CM

## 2013-01-12 DIAGNOSIS — I10 Essential (primary) hypertension: Secondary | ICD-10-CM

## 2013-01-12 DIAGNOSIS — J449 Chronic obstructive pulmonary disease, unspecified: Secondary | ICD-10-CM

## 2013-01-12 DIAGNOSIS — D649 Anemia, unspecified: Secondary | ICD-10-CM

## 2013-01-12 DIAGNOSIS — F1010 Alcohol abuse, uncomplicated: Secondary | ICD-10-CM

## 2013-02-11 ENCOUNTER — Other Ambulatory Visit: Payer: Self-pay | Admitting: Internal Medicine

## 2013-02-11 MED ORDER — BENAZEPRIL HCL 40 MG PO TABS: 40.0000 mg | ORAL_TABLET | Freq: Every day | ORAL | Status: DC

## 2013-03-25 ENCOUNTER — Other Ambulatory Visit: Payer: Self-pay | Admitting: Internal Medicine

## 2013-03-25 MED ORDER — AVANAFIL 50 MG PO TABS: 1.0000 | ORAL_TABLET | ORAL | Status: DC | PRN

## 2013-04-27 ENCOUNTER — Ambulatory Visit: Payer: Medicare Other | Admitting: Internal Medicine

## 2013-04-29 ENCOUNTER — Ambulatory Visit (INDEPENDENT_AMBULATORY_CARE_PROVIDER_SITE_OTHER): Payer: Medicare Other | Admitting: Internal Medicine

## 2013-04-29 ENCOUNTER — Encounter: Payer: Self-pay | Admitting: Internal Medicine

## 2013-04-29 VITALS — BP 140/78 | HR 69 | Temp 97.8°F | Resp 18 | Wt 194.5 lb

## 2013-04-29 DIAGNOSIS — J441 Chronic obstructive pulmonary disease with (acute) exacerbation: Secondary | ICD-10-CM | POA: Diagnosis not present

## 2013-04-29 DIAGNOSIS — J449 Chronic obstructive pulmonary disease, unspecified: Secondary | ICD-10-CM

## 2013-04-29 MED ORDER — ALBUTEROL SULFATE HFA 108 (90 BASE) MCG/ACT IN AERS: 2.0000 | INHALATION_SPRAY | Freq: Four times a day (QID) | RESPIRATORY_TRACT | Status: DC | PRN

## 2013-04-29 MED ORDER — SILDENAFIL CITRATE 20 MG PO TABS
20.0000 mg | ORAL_TABLET | Freq: Three times a day (TID) | ORAL | Status: DC
Start: 2013-04-29 — End: 2013-12-16

## 2013-04-29 MED ORDER — MOMETASONE FURO-FORMOTEROL FUM 200-5 MCG/ACT IN AERO: 2.0000 | INHALATION_SPRAY | Freq: Two times a day (BID) | RESPIRATORY_TRACT | Status: DC

## 2013-04-29 MED ORDER — TIOTROPIUM BROMIDE MONOHYDRATE 2.5 MCG/ACT IN AERS: 2.0000 | INHALATION_SPRAY | Freq: Every day | RESPIRATORY_TRACT | Status: DC

## 2013-04-29 MED ORDER — METHYLPREDNISOLONE ACETATE 40 MG/ML IJ SUSP
40.0000 mg | Freq: Once | INTRAMUSCULAR | Status: AC
  Administered 2013-04-29: 40 mg via INTRAMUSCULAR

## 2013-04-29 NOTE — Patient Instructions (Addendum)
I am treating you for a COPD exacerbation with the following medicaitons  Dulera:  2 puffs twice daily INSTEAD OF ADVAIR )  Until the sample is gone,  Then resume Advair Spiriva  2 puffs daily  (until gone)  Doxycycline 100 mg twice daily with food (antibiotic)  For 7 days  Prednisone taper (start tomorrow)  6 tablets on day 1,  5 on day 2 4 on day 3,  Etc.  (take one time daily) for 6 days    ProAir inhaler 2 puffs every 6 hours as needed for wheezing   Return next week to  make sure you are improving

## 2013-04-29 NOTE — Progress Notes (Signed)
Pre-visit discussion using our clinic review tool. No additional management support is needed unless otherwise documented below in the visit note.  

## 2013-04-30 ENCOUNTER — Telehealth: Payer: Self-pay | Admitting: Internal Medicine

## 2013-04-30 MED ORDER — PREDNISONE (PAK) 10 MG PO TABS: ORAL_TABLET | ORAL | Status: DC

## 2013-05-01 ENCOUNTER — Encounter: Payer: Self-pay | Admitting: Internal Medicine

## 2013-05-01 DIAGNOSIS — J441 Chronic obstructive pulmonary disease with (acute) exacerbation: Secondary | ICD-10-CM | POA: Insufficient documentation

## 2013-05-01 NOTE — Progress Notes (Signed)
Patient ID: Terry [name], male   DOB: 1934-01-29, 80 y.o.   MRN: [id number]  Patient Active Problem List   Diagnosis Date Noted  . Tubulovillous adenoma of colon 06/28/2011    Priority: High  . COPD exacerbation 05/01/2013  . Lower GI bleeding 12/17/2012  . Health care maintenance 11/12/2012  . Other malaise and fatigue 10/02/2012  . Hypoxemia 10/02/2012  . Ear lesion 06/12/2012  . COPD, moderate 12/22/2011  . Hyperlipidemia LDL goal < 70 06/28/2011  . Hypertension 06/28/2011  . Hyperthyroidism 06/28/2011  . History of esophagitis 06/28/2011  . Hip pain, acute, right 06/28/2011  . Alcohol abuse, unspecified 06/28/2011  . Diabetes mellitus type 2, noninsulin dependent   . Anemia     Subjective:  CC:   Chief Complaint  Patient presents with  . Acute Visit    wheezing/ to discuss medication    HPI:   Terry [name] is a 80 y.o. male who presents for Shortness of breath,cough with increased sputum production for the last 3 weeks.  Not wearing his O2 during day .  Denies fevers, chest pain.  Speaking in full sentences.   Past Medical History  Diagnosis Date  . Diabetes mellitus   . hyperthyroidism   . Hyperlipidemia   . Hypertension   . Anemia     Past Surgical History  Procedure Laterality Date  . Cataract extraction, bilateral  2012    Porfilio  . Colonoscopy w/ polypectomy  2011    Dr. Raina Mina  . Appendectomy  1970  . Colonoscopy  12-10-12       The following portions of the patient's history were reviewed and updated as appropriate: Allergies, current medications, and problem list.    Review of Systems:   Patient denies headache, fevers, malaise, unintentional weight loss, skin rash, eye pain, sinus congestion and sinus pain, sore throat, dysphagia,  hemoptysis , cough, dyspnea, wheezing, chest pain, palpitations, orthopnea, edema, abdominal pain, nausea, melena, diarrhea, constipation, flank pain, dysuria, hematuria, urinary  Frequency, nocturia,  numbness, tingling, seizures,  Focal weakness, Loss of consciousness,  Tremor, insomnia, depression, anxiety, and suicidal ideation.     History   Social History  . Marital Status: Married    Spouse Name: N/A    Number of Children: N/A  . Years of Education: N/A   Occupational History  . Not on file.   Social History Main Topics  . Smoking status: Former Smoker -- 2.00 packs/day for 50 years    Types: Cigarettes    Quit date: 06/26/1999  . Smokeless tobacco: Never Used  . Alcohol Use: 15.0 oz/week    25 Glasses of wine per week  . Drug Use: No  . Sexual Activity: Yes   Other Topics Concern  . Not on file   Social History Narrative  . No narrative on file    Objective:  Filed Vitals:   04/29/13 1407  BP: 140/78  Pulse: 69  Temp: 97.8 F (36.6 C)  Resp: 18     General appearance: alert, cooperative and appears stated age Ears: normal TM's and external ear canals both ears Throat: lips, mucosa, and tongue normal; teeth and gums normal Neck: no adenopathy, no carotid bruit, supple, symmetrical, trachea midline and thyroid not enlarged, symmetric, no tenderness/mass/nodules Back: symmetric, no curvature. ROM normal. No CVA tenderness. Lungs: decreased air movement bilaterally   No egophonyy Heart: regular rate and rhythm, S1, S2 normal, no murmur, click, rub or gallop Abdomen: soft, non-tender; bowel sounds normal; no  masses,  no organomegaly Pulses: 2+ and symmetric Skin: Skin color, texture, turgor normal. No rashes or lesions Lymph nodes: Cervical, supraclavicular, and axillary nodes normal.  Assessment and Plan:  COPD, moderate Moderately severe, with ambulatory hypoxia which he is not treating due to fear of being  fired from [employer]/  COPD exacerbation Steroid injection given.  Steroid taper, doxycyline  , Dulera and spiriva prescribed.   Updated Medication List Outpatient Encounter Prescriptions as of 04/29/2013  Medication Sig  . b complex vitamins  tablet Take 1 tablet by mouth daily.  . benazepril (LOTENSIN) 40 MG tablet Take 1 tablet (40 mg total) by mouth daily.  . Cholecalciferol (VITAMIN D3) 1000 UNITS CAPS Take by mouth.  Marland Kitchen CINNAMON PO Take by mouth.  . cyanocobalamin 1000 MCG tablet Take 1,000 mcg by mouth daily.  . Fluticasone-Salmeterol (ADVAIR DISKUS) 250-50 MCG/DOSE AEPB Inhale 1 puff into the lungs 2 (two) times daily.  Marland Kitchen glipiZIDE (GLUCOTROL XL) 2.5 MG 24 hr tablet TAKE 1 TABLET BY MOUTH EVERY DAY  . Ipratropium-Albuterol (COMBIVENT) 20-100 MCG/ACT AERS respimat Inhale 1 puff into the lungs every 6 (six) hours as needed for wheezing.  . methimazole (TAPAZOLE) 5 MG tablet Take 2.5 mg by mouth daily.  . metoprolol succinate (TOPROL-XL) 50 MG 24 hr tablet TAKE 1 TABLET (50 MG TOTAL) BY MOUTH DAILY. TAKE WITH OR IMMEDIATELY FOLLOWING A MEAL.  . simvastatin (ZOCOR) 40 MG tablet TAKE 1 TABLET BY MOUTH AT BEDTIME  . TDaP (BOOSTRIX) 5-2.5-18.5 LF-MCG/0.5 injection Inject 0.5 mLs into the muscle once.  . [DISCONTINUED] Avanafil 50 MG TABS Take 1 tablet by mouth as needed. For ED  . albuterol (PROVENTIL HFA;VENTOLIN HFA) 108 (90 BASE) MCG/ACT inhaler Inhale 2 puffs into the lungs every 6 (six) hours as needed for wheezing or shortness of breath.  . mometasone-formoterol (DULERA) 200-5 MCG/ACT AERO Inhale 2 puffs into the lungs 2 (two) times daily.  . sildenafil (REVATIO) 20 MG tablet Take 1 tablet (20 mg total) by mouth 3 (three) times daily.  . Tiotropium Bromide Monohydrate (SPIRIVA RESPIMAT) 2.5 MCG/ACT AERS Inhale 2 puffs into the lungs daily.  Marland Kitchen zoster vaccine live, PF, (ZOSTAVAX) 46270 UNT/0.65ML injection Inject 19,400 Units into the skin once.  . [EXPIRED] methylPREDNISolone acetate (DEPO-MEDROL) injection 40 mg      No orders of the defined types were placed in this encounter.    No Follow-up on file.

## 2013-05-01 NOTE — Assessment & Plan Note (Signed)
Moderately severe, with ambulatory hypoxia which he is not treating due to fear of being  fired from [employer]/

## 2013-05-01 NOTE — Assessment & Plan Note (Signed)
Steroid taper, doxycyline  , Dulera and spiriva prescribed.

## 2013-05-08 ENCOUNTER — Encounter: Payer: Self-pay | Admitting: Internal Medicine

## 2013-05-08 ENCOUNTER — Ambulatory Visit (INDEPENDENT_AMBULATORY_CARE_PROVIDER_SITE_OTHER): Payer: Medicare Other | Admitting: Internal Medicine

## 2013-05-08 VITALS — BP 142/80 | HR 67 | Temp 97.6°F | Resp 18 | Wt 198.2 lb

## 2013-05-08 DIAGNOSIS — I10 Essential (primary) hypertension: Secondary | ICD-10-CM

## 2013-05-08 DIAGNOSIS — J4489 Other specified chronic obstructive pulmonary disease: Secondary | ICD-10-CM

## 2013-05-08 DIAGNOSIS — J449 Chronic obstructive pulmonary disease, unspecified: Secondary | ICD-10-CM

## 2013-05-08 DIAGNOSIS — E119 Type 2 diabetes mellitus without complications: Secondary | ICD-10-CM | POA: Diagnosis not present

## 2013-05-08 DIAGNOSIS — J441 Chronic obstructive pulmonary disease with (acute) exacerbation: Secondary | ICD-10-CM

## 2013-05-08 LAB — COMPREHENSIVE METABOLIC PANEL
ALBUMIN: 3.8 g/dL (ref 3.5–5.2)
ALT: 26 U/L (ref 0–53)
AST: 21 U/L (ref 0–37)
Alkaline Phosphatase: 49 U/L (ref 39–117)
BUN: 19 mg/dL (ref 6–23)
CO2: 29 mEq/L (ref 19–32)
CREATININE: 1.1 mg/dL (ref 0.4–1.5)
Calcium: 9.6 mg/dL (ref 8.4–10.5)
Chloride: 103 mEq/L (ref 96–112)
GFR: 67.35 mL/min (ref >60.00)
GLUCOSE: 105 mg/dL — AB (ref 70–99)
POTASSIUM: 4.6 meq/L (ref 3.5–5.1)
Sodium: 139 mEq/L (ref 135–145)
Total Bilirubin: 1 mg/dL (ref 0.3–1.2)
Total Protein: 6.5 g/dL (ref 6.0–8.3)

## 2013-05-08 LAB — LIPID PANEL
Cholesterol: 203 mg/dL — ABNORMAL HIGH (ref 0–200)
HDL: 110.1 mg/dL (ref >39.00)
LDL CALC: 78 mg/dL (ref 0–99)
Total CHOL/HDL Ratio: 2
Triglycerides: 75 mg/dL (ref 0.0–149.0)
VLDL: 15 mg/dL (ref 0.0–40.0)

## 2013-05-08 LAB — HEMOGLOBIN A1C: Hgb A1c MFr Bld: 7.3 % — ABNORMAL HIGH (ref 4.6–6.5)

## 2013-05-08 LAB — HM DIABETES FOOT EXAM: HM DIABETIC FOOT EXAM: NORMAL

## 2013-05-08 LAB — MICROALBUMIN / CREATININE URINE RATIO
CREATININE, U: 99.1 mg/dL
MICROALB UR: 7 mg/dL — AB (ref 0.0–1.9)
Microalb Creat Ratio: 7.1 mg/g (ref 0.0–30.0)

## 2013-05-08 NOTE — Patient Instructions (Addendum)
You will need to continue "maintenance inhalerss"  To keep your COPD controlled   Continue dulera twice daily and spiriva once daily samples for now   If you run out of spiriva, but have combivent (ipatropium/albuterol) available , you can substitute it but will need to use it 4 times daily instead of once daily because it is a shorter acting formulation  Dulera,  Symbicort,  and advair are all similar and interchangeable . You only need to use one from this category  On a regular basis    regarding your blood pressure:  Goal BP is 130/80 or less .  We may need to sbustitute something for the benazepril or add something to it if not at goal in another week   Regarding your diabetes:  I may adjust your medications if your A1c is > 7.0

## 2013-05-08 NOTE — Progress Notes (Signed)
Patient ID: Terry [name], male   DOB: 04/16/1933, 80 y.o.   MRN: [id number]  Patient Active Problem List   Diagnosis Date Noted  . Tubulovillous adenoma of colon 06/28/2011    Priority: High  . COPD exacerbation 05/01/2013  . Lower GI bleeding 12/17/2012  . Health care maintenance 11/12/2012  . Other malaise and fatigue 10/02/2012  . Hypoxemia 10/02/2012  . Ear lesion 06/12/2012  . COPD, moderate 12/22/2011  . Hyperlipidemia LDL goal < 70 06/28/2011  . Hypertension 06/28/2011  . Hyperthyroidism 06/28/2011  . History of esophagitis 06/28/2011  . Hip pain, acute, right 06/28/2011  . Alcohol abuse, unspecified 06/28/2011  . Diabetes mellitus type 2, noninsulin dependent   . Anemia     Subjective:  CC:   Chief Complaint  Patient presents with  . Follow-up    HPI:   JULIES [name] is a 80 y.o. male who presents for COPD exacerbation. He present last week with 3 week history of dyspnea without fevers or productive cough and was treated with inhaled and oral steroids, doxycycline.  His wife submits a report that he never started the doxycycline. He feels  Considerably better, and is not dyspneic at rest any more.    Past Medical History  Diagnosis Date  . Diabetes mellitus   . hyperthyroidism   . Hyperlipidemia   . Hypertension   . Anemia     Past Surgical History  Procedure Laterality Date  . Cataract extraction, bilateral  2012    Porfilio  . Colonoscopy w/ polypectomy  2011    Dr. Raina Mina  . Appendectomy  1970  . Colonoscopy  12-10-12       The following portions of the patient's history were reviewed and updated as appropriate: Allergies, current medications, and problem list.    Review of Systems:   Patient denies headache, fevers, malaise, unintentional weight loss, skin rash, eye pain, sinus congestion and sinus pain, sore throat, dysphagia,  hemoptysis , cough, dyspnea, wheezing, chest pain, palpitations, orthopnea, edema, abdominal pain, nausea,  melena, diarrhea, constipation, flank pain, dysuria, hematuria, urinary  Frequency, nocturia, numbness, tingling, seizures,  Focal weakness, Loss of consciousness,  Tremor, insomnia, depression, anxiety, and suicidal ideation.     History   Social History  . Marital Status: Married    Spouse Name: N/A    Number of Children: N/A  . Years of Education: N/A   Occupational History  . Not on file.   Social History Main Topics  . Smoking status: Former Smoker -- 2.00 packs/day for 50 years    Types: Cigarettes    Quit date: 06/26/1999  . Smokeless tobacco: Never Used  . Alcohol Use: 15.0 oz/week    25 Glasses of wine per week  . Drug Use: No  . Sexual Activity: Yes   Other Topics Concern  . Not on file   Social History Narrative  . No narrative on file    Objective:  Filed Vitals:   05/08/13 1143  BP: 142/80  Pulse: 67  Temp: 97.6 F (36.4 C)  Resp: 18     General appearance: alert, cooperative and appears stated age Ears: normal TM's and external ear canals both ears Throat: lips, mucosa, and tongue normal; teeth and gums normal Neck: no adenopathy, no carotid bruit, supple, symmetrical, trachea midline and thyroid not enlarged, symmetric, no tenderness/mass/nodules Back: symmetric, no curvature. ROM normal. No CVA tenderness. Lungs: clear to auscultation bilaterally Heart: regular rate and rhythm, S1, S2 normal, no murmur, click,  rub or gallop Abdomen: soft, non-tender; bowel sounds normal; no masses,  no organomegaly Pulses: 2+ and symmetric Skin: Skin color, texture, turgor normal. No rashes or lesions Lymph nodes: Cervical, supraclavicular, and axillary nodes normal.  Assessment and Plan:  COPD exacerbation Resolved,  Continue daily use of spiriva and LABA/steroid inhlaer.   Diabetes mellitus type 2, noninsulin dependent a1c is  Has risen slightly.  His diabetes is managed by Dr [name].  Foot exam was done today.  Lab Results  Component Value Date   HGBA1C  7.3* 05/08/2013     Hypertension Not wll controlled on current regimen of benazepril 40 mg daily but may be due to recent use  of prednisone . Renal function stable, no changes today.he will check BP at home and call if > 130/80 for modification of regimen,   Lab Results  Component Value Date   CREATININE 1.1 05/08/2013   Lab Results  Component Value Date   NA 139 05/08/2013   K 4.6 05/08/2013   CL 103 05/08/2013   CO2 29 05/08/2013        Updated Medication List Outpatient Encounter Prescriptions as of 05/08/2013  Medication Sig  . albuterol (PROVENTIL HFA;VENTOLIN HFA) 108 (90 BASE) MCG/ACT inhaler Inhale 2 puffs into the lungs every 6 (six) hours as needed for wheezing or shortness of breath.  Marland Kitchen b complex vitamins tablet Take 1 tablet by mouth daily.  . benazepril (LOTENSIN) 40 MG tablet Take 1 tablet (40 mg total) by mouth daily.  . Cholecalciferol (VITAMIN D3) 1000 UNITS CAPS Take by mouth.  Marland Kitchen CINNAMON PO Take by mouth.  . cyanocobalamin 1000 MCG tablet Take 1,000 mcg by mouth daily.  . Fluticasone-Salmeterol (ADVAIR DISKUS) 250-50 MCG/DOSE AEPB Inhale 1 puff into the lungs 2 (two) times daily.  Marland Kitchen glipiZIDE (GLUCOTROL XL) 2.5 MG 24 hr tablet TAKE 1 TABLET BY MOUTH EVERY DAY  . Ipratropium-Albuterol (COMBIVENT) 20-100 MCG/ACT AERS respimat Inhale 1 puff into the lungs every 6 (six) hours as needed for wheezing.  . methimazole (TAPAZOLE) 5 MG tablet Take 2.5 mg by mouth daily.  . metoprolol succinate (TOPROL-XL) 50 MG 24 hr tablet TAKE 1 TABLET (50 MG TOTAL) BY MOUTH DAILY. TAKE WITH OR IMMEDIATELY FOLLOWING A MEAL.  . mometasone-formoterol (DULERA) 200-5 MCG/ACT AERO Inhale 2 puffs into the lungs 2 (two) times daily.  . sildenafil (REVATIO) 20 MG tablet Take 1 tablet (20 mg total) by mouth 3 (three) times daily.  . simvastatin (ZOCOR) 40 MG tablet TAKE 1 TABLET BY MOUTH AT BEDTIME  . TDaP (BOOSTRIX) 5-2.5-18.5 LF-MCG/0.5 injection Inject 0.5 mLs into the muscle once.  .  Tiotropium Bromide Monohydrate (SPIRIVA RESPIMAT) 2.5 MCG/ACT AERS Inhale 2 puffs into the lungs daily.  Marland Kitchen zoster vaccine live, PF, (ZOSTAVAX) 29924 UNT/0.65ML injection Inject 19,400 Units into the skin once.  . predniSONE (STERAPRED UNI-PAK) 10 MG tablet 6 tablets on Day 1 , then reduce by 1 tablet daily until gone     Orders Placed This Encounter  Procedures  . Hemoglobin A1c  . Lipid panel  . Microalbumin / creatinine urine ratio  . Comprehensive metabolic panel  . HM DIABETES EYE EXAM    No Follow-up on file.

## 2013-05-08 NOTE — Progress Notes (Signed)
Pre-visit discussion using our clinic review tool. No additional management support is needed unless otherwise documented below in the visit note.  

## 2013-05-10 ENCOUNTER — Encounter: Payer: Self-pay | Admitting: Internal Medicine

## 2013-05-10 NOTE — Assessment & Plan Note (Signed)
a1c is  Has risen slightly.  His diabetes is managed by Dr [name].  Foot exam was done today.  Lab Results  Component Value Date   HGBA1C 7.3* 05/08/2013

## 2013-05-10 NOTE — Assessment & Plan Note (Signed)
Resolved,  Continue daily use of spiriva and LABA/steroid inhlaer.

## 2013-05-10 NOTE — Assessment & Plan Note (Addendum)
Not wll controlled on current regimen of benazepril 40 mg daily but may be due to recent use  of prednisone . Renal function stable, no changes today.he will check BP at home and call if > 130/80 for modification of regimen,   Lab Results  Component Value Date   CREATININE 1.1 05/08/2013   Lab Results  Component Value Date   NA 139 05/08/2013   K 4.6 05/08/2013   CL 103 05/08/2013   CO2 29 05/08/2013

## 2013-05-14 ENCOUNTER — Other Ambulatory Visit: Payer: Self-pay | Admitting: Internal Medicine

## 2013-05-25 ENCOUNTER — Telehealth: Payer: Self-pay

## 2013-05-25 NOTE — Telephone Encounter (Signed)
Relevant patient education assigned to patient using Emmi. ° °

## 2013-06-01 ENCOUNTER — Telehealth: Payer: Self-pay | Admitting: Internal Medicine

## 2013-06-01 ENCOUNTER — Encounter: Payer: Self-pay | Admitting: Internal Medicine

## 2013-06-01 ENCOUNTER — Ambulatory Visit (INDEPENDENT_AMBULATORY_CARE_PROVIDER_SITE_OTHER): Payer: Medicare Other | Admitting: Internal Medicine

## 2013-06-01 VITALS — BP 160/80 | HR 93 | Temp 98.1°F | Wt 194.0 lb

## 2013-06-01 DIAGNOSIS — J441 Chronic obstructive pulmonary disease with (acute) exacerbation: Secondary | ICD-10-CM | POA: Diagnosis not present

## 2013-06-01 MED ORDER — DOXYCYCLINE HYCLATE 100 MG PO TABS: 100.0000 mg | ORAL_TABLET | Freq: Two times a day (BID) | ORAL | Status: DC

## 2013-06-01 MED ORDER — PREDNISONE (PAK) 10 MG PO TABS: ORAL_TABLET | ORAL | Status: DC

## 2013-06-01 NOTE — Telephone Encounter (Signed)
The patient is having on going problems having a  hard time breathing and coughing wanting an appointment today or tomorrow with Dr. [name] . I did offer an appointment with Raquel . His wife would rather her see Dr. [name] ,because this has been an ongoing problem and she is aware.

## 2013-06-01 NOTE — Progress Notes (Signed)
   Subjective:    Patient ID: Terry [name], male    DOB: Apr 14, 1933, 80 y.o.   MRN: [id number]  HPI 80YO male presents for acute visit.  COPD - had exacerbation in early April which improved without intervention. Now 1 week of productive cough, colored sputum. No fever. No chest pain. No dyspnea. Using inhalers as directed but no OTC meds.  Review of Systems  Constitutional: Negative for fever, chills, activity change and fatigue.  HENT: Negative for congestion, ear discharge, ear pain, hearing loss, nosebleeds, postnasal drip, rhinorrhea, sinus pressure, sneezing, sore throat, tinnitus, trouble swallowing and voice change.   Eyes: Negative for discharge, redness, itching and visual disturbance.  Respiratory: Positive for cough. Negative for chest tightness, shortness of breath, wheezing and stridor.   Cardiovascular: Negative for chest pain and leg swelling.  Musculoskeletal: Negative for arthralgias, myalgias, neck pain and neck stiffness.  Skin: Negative for color change and rash.  Neurological: Negative for dizziness, facial asymmetry and headaches.  Psychiatric/Behavioral: Negative for sleep disturbance.       Objective:    BP 160/80  Pulse 93  Temp(Src) 98.1 F (36.7 C) (Oral)  Wt 194 lb (87.998 kg)  SpO2 90% Physical Exam  Constitutional: He is oriented to person, place, and time. He appears well-developed and well-nourished. No distress.  HENT:  Head: Normocephalic and atraumatic.  Right Ear: External ear normal.  Left Ear: External ear normal.  Nose: Nose normal.  Mouth/Throat: Oropharynx is clear and moist. No oropharyngeal exudate.  Eyes: Conjunctivae and EOM are normal. Pupils are equal, round, and reactive to light. Right eye exhibits no discharge. Left eye exhibits no discharge. No scleral icterus.  Neck: Normal range of motion. Neck supple. No tracheal deviation present. No thyromegaly present.  Cardiovascular: Normal rate, regular rhythm and normal heart  sounds.  Exam reveals no gallop and no friction rub.   No murmur heard. Pulmonary/Chest: Effort normal. No accessory muscle usage. Not tachypneic. No respiratory distress. Decreased breath sounds: prolonged expiration. He has wheezes (scattered). He has rhonchi (scattered). He has no rales. He exhibits no tenderness.  Musculoskeletal: Normal range of motion. He exhibits no edema.  Lymphadenopathy:    He has no cervical adenopathy.  Neurological: He is alert and oriented to person, place, and time. No cranial nerve deficit. Coordination normal.  Skin: Skin is warm and dry. No rash noted. He is not diaphoretic. No erythema. No pallor.  Psychiatric: He has a normal mood and affect. His behavior is normal. Judgment and thought content normal.          Assessment & Plan:   Problem List Items Addressed This Visit   COPD exacerbation - Primary     Symptoms and exam c/w COPD exacerbation. Will start Doxycycline and Prednisone taper. Continue Advair, Spiriva, and prn Albuterol. He will call later this week if symptoms are not improving. If no improvement, we discussed CXR.    Relevant Medications      DOXYCYCLINE HYCLATE 100 MG PO TABS      predniSONE (STERAPRED UNI-PAK) 10 MG tablet       Return if symptoms worsen or fail to improve.

## 2013-06-01 NOTE — Telephone Encounter (Signed)
Patient scheduled with dr. Gilford Rile at [facility]

## 2013-06-01 NOTE — Assessment & Plan Note (Signed)
Symptoms and exam c/w COPD exacerbation. Will start Doxycycline and Prednisone taper. Continue Advair, Spiriva, and prn Albuterol. He will call later this week if symptoms are not improving. If no improvement, we discussed CXR.

## 2013-06-01 NOTE — Progress Notes (Signed)
Pre visit review using our clinic review tool, if applicable. No additional management support is needed unless otherwise documented below in the visit note. 

## 2013-06-10 ENCOUNTER — Telehealth: Payer: Self-pay | Admitting: Internal Medicine

## 2013-06-10 MED ORDER — AMOXICILLIN-POT CLAVULANATE 875-125 MG PO TABS: 1.0000 | ORAL_TABLET | Freq: Two times a day (BID) | ORAL | Status: DC

## 2013-06-10 NOTE — Telephone Encounter (Signed)
i have drafterd a letter requesting that the airline allow mr Delmar to take his oxygen with him to [place],  Please print out.

## 2013-06-11 NOTE — Telephone Encounter (Signed)
Letter placed up front for pick up. email sent to notify patient.

## 2013-06-12 ENCOUNTER — Encounter: Payer: Self-pay | Admitting: Internal Medicine

## 2013-06-24 ENCOUNTER — Other Ambulatory Visit: Payer: Self-pay | Admitting: Internal Medicine

## 2013-07-17 DIAGNOSIS — E538 Deficiency of other specified B group vitamins: Secondary | ICD-10-CM | POA: Diagnosis not present

## 2013-07-17 DIAGNOSIS — E0500 Thyrotoxicosis with diffuse goiter without thyrotoxic crisis or storm: Secondary | ICD-10-CM | POA: Diagnosis not present

## 2013-07-17 DIAGNOSIS — E119 Type 2 diabetes mellitus without complications: Secondary | ICD-10-CM | POA: Diagnosis not present

## 2013-07-20 DIAGNOSIS — E0520 Thyrotoxicosis with toxic multinodular goiter without thyrotoxic crisis or storm: Secondary | ICD-10-CM | POA: Insufficient documentation

## 2013-07-20 DIAGNOSIS — E538 Deficiency of other specified B group vitamins: Secondary | ICD-10-CM | POA: Insufficient documentation

## 2013-07-21 DIAGNOSIS — E538 Deficiency of other specified B group vitamins: Secondary | ICD-10-CM | POA: Diagnosis not present

## 2013-07-21 DIAGNOSIS — E0590 Thyrotoxicosis, unspecified without thyrotoxic crisis or storm: Secondary | ICD-10-CM | POA: Diagnosis not present

## 2013-07-21 DIAGNOSIS — E119 Type 2 diabetes mellitus without complications: Secondary | ICD-10-CM | POA: Diagnosis not present

## 2013-08-12 ENCOUNTER — Encounter: Payer: Self-pay | Admitting: Internal Medicine

## 2013-08-12 ENCOUNTER — Ambulatory Visit (INDEPENDENT_AMBULATORY_CARE_PROVIDER_SITE_OTHER): Payer: Medicare Other | Admitting: Internal Medicine

## 2013-08-12 VITALS — BP 142/72 | HR 74 | Temp 98.5°F | Resp 18 | Ht 73.0 in | Wt 192.2 lb

## 2013-08-12 DIAGNOSIS — R0902 Hypoxemia: Secondary | ICD-10-CM

## 2013-08-12 DIAGNOSIS — F1010 Alcohol abuse, uncomplicated: Secondary | ICD-10-CM

## 2013-08-12 DIAGNOSIS — J449 Chronic obstructive pulmonary disease, unspecified: Secondary | ICD-10-CM

## 2013-08-12 DIAGNOSIS — E785 Hyperlipidemia, unspecified: Secondary | ICD-10-CM

## 2013-08-12 DIAGNOSIS — J438 Other emphysema: Secondary | ICD-10-CM

## 2013-08-12 DIAGNOSIS — J439 Emphysema, unspecified: Secondary | ICD-10-CM

## 2013-08-12 DIAGNOSIS — E119 Type 2 diabetes mellitus without complications: Secondary | ICD-10-CM

## 2013-08-12 NOTE — Progress Notes (Signed)
Pre-visit discussion using our clinic review tool. No additional management support is needed unless otherwise documented below in the visit note.  

## 2013-08-12 NOTE — Patient Instructions (Signed)
I am ordering a portable 02 concentrator to enable you to travel with 02 more comfortably.  I am also ordering an overnight pulse oximetry test to be done at home while you sleep .  This will enable you to wear oxygen at night if needed  Please use your Advair twice daily to keep your lungs expanded

## 2013-08-12 NOTE — Progress Notes (Signed)
Patient ID: Terry [name], male   DOB: [birth year], 80 y.o.   MRN: [id number]  Patient Active Problem List   Diagnosis Date Noted  . Tubulovillous adenoma of colon 06/28/2011    Priority: High  . COPD exacerbation 05/01/2013  . Lower GI bleeding 12/17/2012  . Health care maintenance 11/12/2012  . Other malaise and fatigue 10/02/2012  . Hypoxemia 10/02/2012  . Ear lesion 06/12/2012  . COPD, moderate 12/22/2011  . Hyperlipidemia with target LDL less than 70 06/28/2011  . Hypertension 06/28/2011  . Hyperthyroidism 06/28/2011  . History of esophagitis 06/28/2011  . Hip pain, acute, right 06/28/2011  . Alcohol abuse, unspecified 06/28/2011  . Diabetes mellitus type 2, noninsulin dependent   . Anemia     Subjective:  CC:   Chief Complaint  Patient presents with  . Follow-up  . Diabetes    HPI:   Terry [name] is a 80 y.o. male who presents for Follow up on chronic conditions including COPD, with chronic exertional hypoxia, and alcohol abuse. ,  Patient travelled to [place] without 02 because he did not have a portable tank to take on the airplane.  He tolerated the trip without any major problems.   02 sats in the office on room air drop to 86% with ambulation and improve to 93% at rest.  With 02 ambulatory stats 975  He has not been using the supplemental 02 since the trip in April.  He admits that when he was using the 02 at night, he slept better, felt better in the morning and had more  Energy.  He has not had a sleep study. He has reduced his work schedule to part time at [employer].   2) Stopped drinking cold [place] last Friday .  Was drinking more than 3 glasses of wine per night,         Past Medical History  Diagnosis Date  . Diabetes mellitus   . hyperthyroidism   . Hyperlipidemia   . Hypertension   . Anemia     Past Surgical History  Procedure Laterality Date  . Cataract extraction, bilateral  2012    Porfilio  . Colonoscopy w/ polypectomy  2011    Dr.  Raina Mina  . Appendectomy  1970  . Colonoscopy  12-10-12       The following portions of the patient's history were reviewed and updated as appropriate: Allergies, current medications, and problem list.    Review of Systems:   Patient denies headache, fevers, malaise, unintentional weight loss, skin rash, eye pain, sinus congestion and sinus pain, sore throat, dysphagia,  hemoptysis , cough, dyspnea, wheezing, chest pain, palpitations, orthopnea, edema, abdominal pain, nausea, melena, diarrhea, constipation, flank pain, dysuria, hematuria, urinary  Frequency, nocturia, numbness, tingling, seizures,  Focal weakness, Loss of consciousness,  Tremor, insomnia, depression, anxiety, and suicidal ideation.     History   Social History  . Marital Status: Married    Spouse Name: N/A    Number of Children: N/A  . Years of Education: N/A   Occupational History  . Not on file.   Social History Main Topics  . Smoking status: Former Smoker -- 2.00 packs/day for 50 years    Types: Cigarettes    Quit date: 06/26/1999  . Smokeless tobacco: Never Used  . Alcohol Use: 15.0 oz/week    25 Glasses of wine per week  . Drug Use: No  . Sexual Activity: Yes   Other Topics Concern  . Not on file  Social History Narrative  . No narrative on file    Objective:  Filed Vitals:   08/12/13 1133  BP: 142/72  Pulse: 74  Temp: 98.5 F (36.9 C)  Resp: 18     General appearance: alert, cooperative and appears stated age Ears: normal TM's and external ear canals both ears Throat: lips, mucosa, and tongue normal; teeth and gums normal Neck: no adenopathy, no carotid bruit, supple, symmetrical, trachea midline and thyroid not enlarged, symmetric, no tenderness/mass/nodules Back: symmetric, no curvature. ROM normal. No CVA tenderness. Lungs: clear to auscultation bilaterally Heart: regular rate and rhythm, S1, S2 normal, no murmur, click, rub or gallop Abdomen: soft, non-tender; bowel sounds  normal; no masses,  no organomegaly Pulses: 2+ and symmetric Skin: Skin color, texture, turgor normal. No rashes or lesions Lymph nodes: Cervical, supraclavicular, and axillary nodes normal.  Assessment and Plan:  COPD, moderate With demonstrated need for supplemental 02 with ambulation and ptoentially at night.  Overnight home sleep study ordered, and portable 02 tank ordered.   Alcohol abuse, unspecified Patient congratulated on his abstinence., He reports no withdrawal symptoms.   Diabetes mellitus type 2, noninsulin dependent Well controlled, Managed by Dr. [name].   Lab Results  Component Value Date   HGBA1C 7.3* 05/08/2013     Hyperlipidemia with target LDL less than 21 Well controlled on current statin therapy.   Liver enzymes are normal , no changes today.  Lab Results  Component Value Date   CHOL 203* 05/08/2013   HDL 110.10 05/08/2013   LDLCALC 78 05/08/2013   LDLDIRECT 81.5 10/02/2012   TRIG 75.0 05/08/2013   CHOLHDL 2 05/08/2013   Lab Results  Component Value Date   ALT 26 05/08/2013   AST 21 05/08/2013   ALKPHOS 49 05/08/2013   BILITOT 1.0 05/08/2013      Updated Medication List Outpatient Encounter Prescriptions as of 08/12/2013  Medication Sig  . albuterol (PROVENTIL HFA;VENTOLIN HFA) 108 (90 BASE) MCG/ACT inhaler Inhale 2 puffs into the lungs every 6 (six) hours as needed for wheezing or shortness of breath.  Marland Kitchen amoxicillin-clavulanate (AUGMENTIN) 875-125 MG per tablet Take 1 tablet by mouth 2 (two) times daily.  Marland Kitchen b complex vitamins tablet Take 1 tablet by mouth daily.  . benazepril (LOTENSIN) 40 MG tablet Take 1 tablet (40 mg total) by mouth daily.  . Cholecalciferol (VITAMIN D3) 1000 UNITS CAPS Take by mouth.  Marland Kitchen CINNAMON PO Take by mouth.  . cyanocobalamin 1000 MCG tablet Take 1,000 mcg by mouth daily.  Marland Kitchen doxycycline (VIBRA-TABS) 100 MG tablet Take 1 tablet (100 mg total) by mouth 2 (two) times daily.  . Fluticasone-Salmeterol (ADVAIR DISKUS) 250-50 MCG/DOSE  AEPB Inhale 1 puff into the lungs 2 (two) times daily.  Marland Kitchen glipiZIDE (GLUCOTROL XL) 2.5 MG 24 hr tablet TAKE 1 TABLET BY MOUTH EVERY DAY  . Ipratropium-Albuterol (COMBIVENT) 20-100 MCG/ACT AERS respimat Inhale 1 puff into the lungs every 6 (six) hours as needed for wheezing.  . methimazole (TAPAZOLE) 5 MG tablet Take 2.5 mg by mouth daily.  . metoprolol succinate (TOPROL-XL) 50 MG 24 hr tablet TAKE 1 TABLET (50 MG TOTAL) BY MOUTH DAILY. TAKE WITH OR IMMEDIATELY FOLLOWING A MEAL.  . mometasone-formoterol (DULERA) 200-5 MCG/ACT AERO Inhale 2 puffs into the lungs 2 (two) times daily.  . predniSONE (STERAPRED UNI-PAK) 10 MG tablet 6 tablets on Day 1 , then reduce by 1 tablet daily until gone  . sildenafil (REVATIO) 20 MG tablet Take 1 tablet (20 mg total) by mouth  3 (three) times daily.  . simvastatin (ZOCOR) 40 MG tablet TAKE 1 TABLET BY MOUTH AT BEDTIME  . Tiotropium Bromide Monohydrate (SPIRIVA RESPIMAT) 2.5 MCG/ACT AERS Inhale 2 puffs into the lungs daily.     Orders Placed This Encounter  Procedures  . Ambulatory referral to Sleep Studies    No Follow-up on file.

## 2013-08-14 ENCOUNTER — Ambulatory Visit: Payer: Medicare Other | Admitting: Internal Medicine

## 2013-08-15 NOTE — Assessment & Plan Note (Signed)
With demonstrated need for supplemental 02 with ambulation and ptoentially at night.  Overnight home sleep study ordered, and portable 02 tank ordered.

## 2013-08-15 NOTE — Assessment & Plan Note (Signed)
Well controlled on current statin therapy.   Liver enzymes are normal , no changes today.  Lab Results  Component Value Date   CHOL 203* 05/08/2013   HDL 110.10 05/08/2013   LDLCALC 78 05/08/2013   LDLDIRECT 81.5 10/02/2012   TRIG 75.0 05/08/2013   CHOLHDL 2 05/08/2013   Lab Results  Component Value Date   ALT 26 05/08/2013   AST 21 05/08/2013   ALKPHOS 49 05/08/2013   BILITOT 1.0 05/08/2013

## 2013-08-15 NOTE — Assessment & Plan Note (Signed)
Well controlled, Managed by Dr. [name].   Lab Results  Component Value Date   HGBA1C 7.3* 05/08/2013

## 2013-08-15 NOTE — Assessment & Plan Note (Signed)
Patient congratulated on his abstinence., He reports no withdrawal symptoms.

## 2013-08-27 ENCOUNTER — Telehealth: Payer: Self-pay | Admitting: Internal Medicine

## 2013-08-27 DIAGNOSIS — J449 Chronic obstructive pulmonary disease, unspecified: Secondary | ICD-10-CM

## 2013-08-27 NOTE — Telephone Encounter (Signed)
Left message fgor patient to return my call.

## 2013-08-27 NOTE — Assessment & Plan Note (Addendum)
His overnight pulse oximetry  done on July 20 did not indicate a significant need for nocturnal  supplemental oxygen,  He spent 30 minutes with sats < 89% but never below 85%.  He can return the 02

## 2013-08-27 NOTE — Telephone Encounter (Signed)
His overnight pulse oximetry did not indicate a significant need for nocturnal  supplemental oxygen,  He spent 30 minutes with sats < 89% but never below 85%.  He can return the 02

## 2013-08-28 NOTE — Telephone Encounter (Signed)
Patient notified and voiced understanding.

## 2013-09-04 ENCOUNTER — Encounter: Payer: Self-pay | Admitting: Internal Medicine

## 2013-09-06 ENCOUNTER — Other Ambulatory Visit: Payer: Self-pay | Admitting: Adult Health

## 2013-09-21 ENCOUNTER — Telehealth: Payer: Self-pay | Admitting: Internal Medicine

## 2013-09-21 NOTE — Telephone Encounter (Signed)
Letter on printer

## 2013-09-21 NOTE — Telephone Encounter (Signed)
Patient needs and order for [facility] to pick UP 02 [facility] will not pick up at patient request please advise.

## 2013-09-22 NOTE — Telephone Encounter (Signed)
Order faxed as patient requested talked with anita at [facility] and they are aware to pick up 02.

## 2013-10-08 ENCOUNTER — Other Ambulatory Visit: Payer: Self-pay | Admitting: *Deleted

## 2013-10-08 MED ORDER — SIMVASTATIN 40 MG PO TABS: ORAL_TABLET | ORAL | Status: DC

## 2013-10-08 MED ORDER — GLIPIZIDE ER 2.5 MG PO TB24: ORAL_TABLET | ORAL | Status: DC

## 2013-11-05 ENCOUNTER — Encounter: Payer: Self-pay | Admitting: Internal Medicine

## 2013-11-12 ENCOUNTER — Ambulatory Visit (INDEPENDENT_AMBULATORY_CARE_PROVIDER_SITE_OTHER): Payer: Medicare Other | Admitting: Internal Medicine

## 2013-11-12 ENCOUNTER — Encounter: Payer: Self-pay | Admitting: Internal Medicine

## 2013-11-12 VITALS — BP 128/68 | HR 65 | Temp 97.7°F | Resp 16 | Ht 73.0 in | Wt 185.5 lb

## 2013-11-12 DIAGNOSIS — N400 Enlarged prostate without lower urinary tract symptoms: Secondary | ICD-10-CM

## 2013-11-12 DIAGNOSIS — R1031 Right lower quadrant pain: Secondary | ICD-10-CM | POA: Insufficient documentation

## 2013-11-12 DIAGNOSIS — R1032 Left lower quadrant pain: Secondary | ICD-10-CM

## 2013-11-12 DIAGNOSIS — M545 Low back pain, unspecified: Secondary | ICD-10-CM

## 2013-11-12 DIAGNOSIS — R634 Abnormal weight loss: Secondary | ICD-10-CM

## 2013-11-12 DIAGNOSIS — K2990 Gastroduodenitis, unspecified, without bleeding: Secondary | ICD-10-CM | POA: Diagnosis not present

## 2013-11-12 DIAGNOSIS — F1011 Alcohol abuse, in remission: Secondary | ICD-10-CM

## 2013-11-12 DIAGNOSIS — R1030 Lower abdominal pain, unspecified: Secondary | ICD-10-CM

## 2013-11-12 DIAGNOSIS — E119 Type 2 diabetes mellitus without complications: Secondary | ICD-10-CM

## 2013-11-12 DIAGNOSIS — K2970 Gastritis, unspecified, without bleeding: Secondary | ICD-10-CM | POA: Diagnosis not present

## 2013-11-12 DIAGNOSIS — R1013 Epigastric pain: Secondary | ICD-10-CM | POA: Diagnosis not present

## 2013-11-12 DIAGNOSIS — R338 Other retention of urine: Secondary | ICD-10-CM

## 2013-11-12 DIAGNOSIS — N401 Enlarged prostate with lower urinary tract symptoms: Secondary | ICD-10-CM

## 2013-11-12 DIAGNOSIS — E0590 Thyrotoxicosis, unspecified without thyrotoxic crisis or storm: Secondary | ICD-10-CM | POA: Diagnosis not present

## 2013-11-12 DIAGNOSIS — K922 Gastrointestinal hemorrhage, unspecified: Secondary | ICD-10-CM

## 2013-11-12 DIAGNOSIS — F1010 Alcohol abuse, uncomplicated: Secondary | ICD-10-CM

## 2013-11-12 DIAGNOSIS — Z23 Encounter for immunization: Secondary | ICD-10-CM

## 2013-11-12 LAB — URINALYSIS, ROUTINE W REFLEX MICROSCOPIC
Bilirubin Urine: NEGATIVE
Hgb urine dipstick: NEGATIVE
KETONES UR: NEGATIVE
Leukocytes, UA: NEGATIVE
Nitrite: NEGATIVE
RBC / HPF: NONE SEEN (ref 0–?)
SPECIFIC GRAVITY, URINE: 1.02 (ref 1.000–1.030)
Total Protein, Urine: NEGATIVE
URINE GLUCOSE: NEGATIVE
UROBILINOGEN UA: 0.2 (ref 0.0–1.0)
WBC, UA: NONE SEEN (ref 0–?)
pH: 5.5 (ref 5.0–8.0)

## 2013-11-12 LAB — MICROALBUMIN / CREATININE URINE RATIO
CREATININE, U: 111.6 mg/dL
MICROALB/CREAT RATIO: 1.3 mg/g (ref 0.0–30.0)
Microalb, Ur: 1.5 mg/dL (ref 0.0–1.9)

## 2013-11-12 LAB — LIPASE: Lipase: 27 U/L (ref 11.0–59.0)

## 2013-11-12 LAB — HEMOGLOBIN A1C: Hgb A1c MFr Bld: 7.4 % — ABNORMAL HIGH (ref 4.6–6.5)

## 2013-11-12 LAB — LIPID PANEL
Cholesterol: 130 mg/dL (ref 0–200)
HDL: 35.3 mg/dL — ABNORMAL LOW (ref >39.00)
LDL CALC: 75 mg/dL (ref 0–99)
NonHDL: 94.7
TRIGLYCERIDES: 97 mg/dL (ref 0.0–149.0)
Total CHOL/HDL Ratio: 4
VLDL: 19.4 mg/dL (ref 0.0–40.0)

## 2013-11-12 LAB — PSA, MEDICARE: PSA: 0.75 ng/ml (ref 0.10–4.00)

## 2013-11-12 LAB — H. PYLORI ANTIBODY, IGG: H Pylori IgG: NEGATIVE

## 2013-11-12 MED ORDER — OMEPRAZOLE 40 MG PO CPDR: 40.0000 mg | DELAYED_RELEASE_CAPSULE | Freq: Every day | ORAL | Status: DC

## 2013-11-12 NOTE — Patient Instructions (Addendum)
Work up is in progress for your abdominal pain which may be due to:  Gastritis Pancreatitis Urinary retention from enlarged prostate Aortic aneurysm  Please start the omeprazole in the morning  CT scan to be set up

## 2013-11-12 NOTE — Progress Notes (Signed)
Patient ID: Terry [name], male   DOB: Jan 02, 1934, 80 y.o.   MRN: [id number]   Patient Active Problem List   Diagnosis Date Noted  . Tubulovillous adenoma of colon 06/28/2011    Priority: High  . Loss of weight 11/12/2013  . Abdominal pain 11/12/2013  . Enlarged prostate with urinary retention 11/12/2013  . Lower GI bleeding 12/17/2012  . Health care maintenance 11/12/2012  . Other malaise and fatigue 10/02/2012  . Hypoxemia 10/02/2012  . Ear lesion 06/12/2012  . COPD, moderate 12/22/2011  . Hyperlipidemia with target LDL less than 70 06/28/2011  . Hypertension 06/28/2011  . Hyperthyroidism 06/28/2011  . History of esophagitis 06/28/2011  . Hip pain, acute, right 06/28/2011  . History of alcohol abuse 06/28/2011  . Diabetes mellitus type 2, noninsulin dependent   . Anemia     Subjective:  CC:   Chief Complaint  Patient presents with  . Follow-up    3 month  . Abdominal Pain    Patient stopped drinking alcohol in July and now feels this the reason for abdominal pain.    HPI:   Terry [name] is a 80 y.o. male who presents for  6 month follow up on DM  Not checking sugars or following a low glyceminc index diet,   Acute issues: Abdominal pain ,. Started 2 months ago,  Daily and persistent lower abdomiinal paim both quadrants.  Also having intermittent pain in his back.  Dull ache in back,  abd pain feels like a gnawing pain   Taking ibuprofen 400 mg prn ,  Not daily.  Not  taking a PPI  Or an H2 blocker.   Taking a probiotic for several weeks,  No difference.   Has not tried mylanta, or any antacid , or baking soda  Gave up drinking alcohol completely,in July   No change in weight,  No change in appetite. But has lost 13 ls since April.    Stools are brwon,  No blood ,  Bladder voids  Have been low volume due to restricted flow   Exam  Mildly tender wuithout guarding,  No masses,   Prostate enlarged on right side  Stool heme negative     Past Medical History   Diagnosis Date  . Diabetes mellitus   . hyperthyroidism   . Hyperlipidemia   . Hypertension   . Anemia     Past Surgical History  Procedure Laterality Date  . Cataract extraction, bilateral  2012    Porfilio  . Colonoscopy w/ polypectomy  2011    Dr. Raina Mina  . Appendectomy  1970  . Colonoscopy  12-10-12       The following portions of the patient's history were reviewed and updated as appropriate: Allergies, current medications, and problem list.    Review of Systems:   Patient denies headache, fevers, malaise, unintentional weight loss, skin rash, eye pain, sinus congestion and sinus pain, sore throat, dysphagia,  hemoptysis , cough, dyspnea, wheezing, chest pain, palpitations, orthopnea, edema, abdominal pain, nausea, melena, diarrhea, constipation, flank pain, dysuria, hematuria, urinary  Frequency, nocturia, numbness, tingling, seizures,  Focal weakness, Loss of consciousness,  Tremor, insomnia, depression, anxiety, and suicidal ideation.     History   Social History  . Marital Status: Married    Spouse Name: N/A    Number of Children: N/A  . Years of Education: N/A   Occupational History  . Not on file.   Social History Main Topics  . Smoking status: Former  Smoker -- 2.00 packs/day for 50 years    Types: Cigarettes    Quit date: 06/26/1999  . Smokeless tobacco: Never Used  . Alcohol Use: 15.0 oz/week    25 Glasses of wine per week  . Drug Use: No  . Sexual Activity: Yes   Other Topics Concern  . Not on file   Social History Narrative  . No narrative on file    Objective:  Filed Vitals:   11/12/13 0910  BP: 128/68  Pulse: 65  Temp: 97.7 F (36.5 C)  Resp: 16     General appearance: alert, cooperative and appears stated age Ears: normal TM's and external ear canals both ears Throat: lips, mucosa, and tongue normal; teeth and gums normal Neck: no adenopathy, no carotid bruit, supple, symmetrical, trachea midline and thyroid not enlarged,  symmetric, no tenderness/mass/nodules Back: symmetric, no curvature. ROM normal. No CVA tenderness. Lungs: clear to auscultation bilaterally Heart: regular rate and rhythm, S1, S2 normal, no murmur, click, rub or gallop Abdomen: soft, non-tender; bowel sounds normal; no masses,  no organomegaly Pulses: 2+ and symmetric Skin: Skin color, texture, turgor normal. No rashes or lesions Lymph nodes: Cervical, supraclavicular, and axillary nodes normal.  Assessment and Plan:  Diabetes mellitus type 2, noninsulin dependent Slight loss of control by repeat A1c  Managed by Dr. [name]. Foot exam was normal today.He is in an ACE inhibitor.   Lab Results  Component Value Date   HGBA1C 7.4* 11/12/2013    Lab Results  Component Value Date   MICROALBUR 1.5 11/12/2013       Hyperthyroidism Managed by Dr. [name].  Given hin inunentional wt loss,  tsh was checked today and normal.  Lab Results  Component Value Date   TSH 1.590 11/12/2013     Enlarged prostate with urinary retention His PSA is normal.  Will start Flomax  Lab Results  Component Value Date   PSA 0.75 11/12/2013      Abdominal pain Lioase and H Pylori are normal.  Hemoccult today was normal.  Treating for gastritis,  But given his concurrent back pain and histoty of tobacco abuse and hypertension need to rule out AA and mass.  CT abd and pelvis ordered.    Lab Results  Component Value Date   LIPASE 27.0 11/12/2013    History of alcohol abuse He quit several months ago after realizing htat is daily intake was excessive .  No symptoms of withdrawal.   Lab Results  Component Value Date   VITAMINB12 479 10/10/2012   No results found for this basename: FOLATE       Updated Medication List Outpatient Encounter Prescriptions as of 11/12/2013  Medication Sig  . albuterol (PROVENTIL HFA;VENTOLIN HFA) 108 (90 BASE) MCG/ACT inhaler Inhale 2 puffs into the lungs every 6 (six) hours as needed for wheezing or shortness of  breath.  Marland Kitchen b complex vitamins tablet Take 1 tablet by mouth daily.  . benazepril (LOTENSIN) 40 MG tablet Take 1 tablet (40 mg total) by mouth daily.  . Cholecalciferol (VITAMIN D3) 1000 UNITS CAPS Take by mouth.  Marland Kitchen CINNAMON PO Take by mouth.  . cyanocobalamin 1000 MCG tablet Take 1,000 mcg by mouth daily.  . Fluticasone-Salmeterol (ADVAIR DISKUS) 250-50 MCG/DOSE AEPB Inhale 1 puff into the lungs 2 (two) times daily.  Marland Kitchen glipiZIDE (GLUCOTROL XL) 2.5 MG 24 hr tablet TAKE 1 TABLET BY MOUTH EVERY DAY  . Ipratropium-Albuterol (COMBIVENT) 20-100 MCG/ACT AERS respimat Inhale 1 puff into the lungs every 6 (six)  hours as needed for wheezing.  . methimazole (TAPAZOLE) 5 MG tablet Take 2.5 mg by mouth daily.  . metoprolol succinate (TOPROL-XL) 50 MG 24 hr tablet TAKE 1 TABLET (50 MG TOTAL) BY MOUTH DAILY. TAKE WITH OR IMMEDIATELY FOLLOWING A MEAL.  . metoprolol succinate (TOPROL-XL) 50 MG 24 hr tablet TAKE 1 TABLET (50 MG TOTAL) BY MOUTH DAILY. TAKE WITH OR IMMEDIATELY FOLLOWING A MEAL.  . mometasone-formoterol (DULERA) 200-5 MCG/ACT AERO Inhale 2 puffs into the lungs 2 (two) times daily.  . sildenafil (REVATIO) 20 MG tablet Take 1 tablet (20 mg total) by mouth 3 (three) times daily.  . simvastatin (ZOCOR) 40 MG tablet TAKE 1 TABLET BY MOUTH AT BEDTIME  . Tiotropium Bromide Monohydrate (SPIRIVA RESPIMAT) 2.5 MCG/ACT AERS Inhale 2 puffs into the lungs daily.  Marland Kitchen omeprazole (PRILOSEC) 40 MG capsule Take 1 capsule (40 mg total) by mouth daily.  . predniSONE (STERAPRED UNI-PAK) 10 MG tablet 6 tablets on Day 1 , then reduce by 1 tablet daily until gone  . tamsulosin (FLOMAX) 0.4 MG CAPS capsule Take 1 capsule (0.4 mg total) by mouth daily.  . [DISCONTINUED] amoxicillin-clavulanate (AUGMENTIN) 875-125 MG per tablet Take 1 tablet by mouth 2 (two) times daily.  . [DISCONTINUED] doxycycline (VIBRA-TABS) 100 MG tablet Take 1 tablet (100 mg total) by mouth 2 (two) times daily.     Orders Placed This Encounter   Procedures  . CT Abdomen Pelvis W Contrast  . Pneumococcal conjugate vaccine 13-valent  . H. pylori antibody, IgG  . Lipase  . PSA, Medicare  . Urinalysis, Routine w reflex microscopic  . Hemoglobin A1c  . Lipid panel  . Microalbumin / creatinine urine ratio  . T4 AND TSH  . POC Hemoccult Bld/Stl (1-Cd Office Dx)  . HM COLONOSCOPY    No Follow-up on file.

## 2013-11-13 ENCOUNTER — Encounter: Payer: Self-pay | Admitting: Internal Medicine

## 2013-11-13 LAB — T4 AND TSH
T4, Total: 8 ug/dL (ref 4.5–12.0)
TSH: 1.59 u[IU]/mL (ref 0.450–4.500)

## 2013-11-13 MED ORDER — TAMSULOSIN HCL 0.4 MG PO CAPS: 0.4000 mg | ORAL_CAPSULE | Freq: Every day | ORAL | Status: DC

## 2013-11-13 NOTE — Assessment & Plan Note (Addendum)
Lioase and H Pylori are normal.  Hemoccult today was normal.  Treating for gastritis,  But given his concurrent back pain and histoty of tobacco abuse and hypertension need to rule out AA and mass.  CT abd and pelvis ordered.    Lab Results  Component Value Date   LIPASE 27.0 11/12/2013

## 2013-11-13 NOTE — Assessment & Plan Note (Signed)
Slight loss of control by repeat A1c  Managed by Dr. [name]. Foot exam was normal today.He is in an ACE inhibitor.   Lab Results  Component Value Date   HGBA1C 7.4* 11/12/2013    Lab Results  Component Value Date   MICROALBUR 1.5 11/12/2013

## 2013-11-13 NOTE — Assessment & Plan Note (Signed)
His PSA is normal.  Will start Flomax  Lab Results  Component Value Date   PSA 0.75 11/12/2013

## 2013-11-13 NOTE — Assessment & Plan Note (Addendum)
He quit several months ago after realizing htat is daily intake was excessive .  No symptoms of withdrawal.   Lab Results  Component Value Date   VITAMINB12 479 10/10/2012   No results found for this basename: FOLATE

## 2013-11-13 NOTE — Assessment & Plan Note (Signed)
Managed by Dr. [name].  Given hin inunentional wt loss,  tsh was checked today and normal.  Lab Results  Component Value Date   TSH 1.590 11/12/2013

## 2013-11-19 ENCOUNTER — Ambulatory Visit: Payer: Self-pay | Admitting: Internal Medicine

## 2013-11-19 DIAGNOSIS — N401 Enlarged prostate with lower urinary tract symptoms: Secondary | ICD-10-CM | POA: Diagnosis not present

## 2013-11-19 DIAGNOSIS — N323 Diverticulum of bladder: Secondary | ICD-10-CM | POA: Diagnosis not present

## 2013-11-19 DIAGNOSIS — R1030 Lower abdominal pain, unspecified: Secondary | ICD-10-CM | POA: Diagnosis not present

## 2013-11-19 DIAGNOSIS — R338 Other retention of urine: Secondary | ICD-10-CM | POA: Diagnosis not present

## 2013-11-19 DIAGNOSIS — R634 Abnormal weight loss: Secondary | ICD-10-CM | POA: Diagnosis not present

## 2013-11-22 ENCOUNTER — Telehealth: Payer: Self-pay | Admitting: Internal Medicine

## 2013-11-22 DIAGNOSIS — N323 Diverticulum of bladder: Secondary | ICD-10-CM | POA: Insufficient documentation

## 2013-11-22 DIAGNOSIS — Z9049 Acquired absence of other specified parts of digestive tract: Secondary | ICD-10-CM | POA: Insufficient documentation

## 2013-11-22 DIAGNOSIS — N401 Enlarged prostate with lower urinary tract symptoms: Secondary | ICD-10-CM

## 2013-11-22 DIAGNOSIS — R338 Other retention of urine: Principal | ICD-10-CM

## 2013-11-22 NOTE — Assessment & Plan Note (Signed)
Confirmed with CT scan .  flomax prescribed.

## 2013-11-25 ENCOUNTER — Encounter: Payer: Self-pay | Admitting: Internal Medicine

## 2013-11-27 ENCOUNTER — Encounter: Payer: Self-pay | Admitting: Internal Medicine

## 2013-11-27 DIAGNOSIS — N401 Enlarged prostate with lower urinary tract symptoms: Principal | ICD-10-CM

## 2013-11-27 DIAGNOSIS — N138 Other obstructive and reflux uropathy: Secondary | ICD-10-CM

## 2013-11-29 DIAGNOSIS — C44629 Squamous cell carcinoma of skin of left upper limb, including shoulder: Secondary | ICD-10-CM

## 2013-11-29 HISTORY — DX: Squamous cell carcinoma of skin of left upper limb, including shoulder: C44.629

## 2013-12-01 ENCOUNTER — Encounter: Payer: Self-pay | Admitting: Internal Medicine

## 2013-12-02 ENCOUNTER — Telehealth: Payer: Self-pay | Admitting: Internal Medicine

## 2013-12-02 ENCOUNTER — Other Ambulatory Visit: Payer: Self-pay | Admitting: Internal Medicine

## 2013-12-02 MED ORDER — CEPHALEXIN 500 MG PO CAPS: 500.0000 mg | ORAL_CAPSULE | Freq: Four times a day (QID) | ORAL | Status: DC

## 2013-12-02 NOTE — Telephone Encounter (Signed)
i WILL SEND AN antibiotic to his pharmacy to start taking .  keflex 4 times daily for one week,  Will cover staph infection.   Please eat a cup of yogurt daily to prevent diarrhea

## 2013-12-02 NOTE — Telephone Encounter (Addendum)
Patient received Flu shot and Prevnar on 11/12/13  and called to day to stated he thought he is having either infection or a reaction to the shot Patient will be at work until 2 PM , Patient stated there is a scab with pus around site.

## 2013-12-02 NOTE — Telephone Encounter (Signed)
Pt notified and verbalized understanding.

## 2013-12-03 MED ORDER — TRAMADOL HCL 50 MG PO TABS: 50.0000 mg | ORAL_TABLET | Freq: Four times a day (QID) | ORAL | Status: DC | PRN

## 2013-12-03 MED ORDER — TRAMADOL HCL 50 MG PO TABS: 50.0000 mg | ORAL_TABLET | Freq: Three times a day (TID) | ORAL | Status: DC | PRN

## 2013-12-03 NOTE — Telephone Encounter (Signed)
It will be faxed over this afternoon

## 2013-12-03 NOTE — Telephone Encounter (Signed)
Faxed to pharmacy

## 2013-12-11 ENCOUNTER — Telehealth: Payer: Self-pay | Admitting: Internal Medicine

## 2013-12-11 NOTE — Telephone Encounter (Signed)
Just overbook me sometime on Monday afternoon to look at the arm.

## 2013-12-11 NOTE — Telephone Encounter (Signed)
Pt aware of appt 12/14/13 @ 3:30. Can you please schedule?

## 2013-12-11 NOTE — Telephone Encounter (Signed)
Mrs. Mcgillicuddy called on behalf on the pt. She said the location of the infection in his arm after having received the flu shot has not fully healed. He's finished taking the medication for this and they're concerned about the fact that it's not fully healed. Mr. Stranahan would like to be seen and he needs an appt after 2pm due to his work schedule. I didn't see anything available in the time they're requesting. Please call the pt if he needs to come in or if he needs a refill of his medication. Pt's ph# [phone] Thank you.

## 2013-12-14 ENCOUNTER — Encounter: Payer: Self-pay | Admitting: Internal Medicine

## 2013-12-14 ENCOUNTER — Ambulatory Visit (INDEPENDENT_AMBULATORY_CARE_PROVIDER_SITE_OTHER): Payer: Medicare Other | Admitting: Internal Medicine

## 2013-12-14 VITALS — BP 130/66 | HR 73 | Temp 98.3°F | Resp 16 | Ht 73.0 in | Wt 181.0 lb

## 2013-12-14 DIAGNOSIS — N323 Diverticulum of bladder: Secondary | ICD-10-CM | POA: Diagnosis not present

## 2013-12-14 DIAGNOSIS — L02429 Furuncle of limb, unspecified: Secondary | ICD-10-CM | POA: Diagnosis not present

## 2013-12-14 DIAGNOSIS — N400 Enlarged prostate without lower urinary tract symptoms: Secondary | ICD-10-CM

## 2013-12-14 DIAGNOSIS — K635 Polyp of colon: Secondary | ICD-10-CM

## 2013-12-14 DIAGNOSIS — D126 Benign neoplasm of colon, unspecified: Secondary | ICD-10-CM

## 2013-12-14 DIAGNOSIS — N401 Enlarged prostate with lower urinary tract symptoms: Secondary | ICD-10-CM

## 2013-12-14 DIAGNOSIS — R1030 Lower abdominal pain, unspecified: Secondary | ICD-10-CM

## 2013-12-14 DIAGNOSIS — R338 Other retention of urine: Secondary | ICD-10-CM

## 2013-12-14 LAB — POCT URINALYSIS DIPSTICK
Bilirubin, UA: NEGATIVE
Blood, UA: NEGATIVE
Glucose, UA: NEGATIVE
KETONES UA: NEGATIVE
LEUKOCYTES UA: NEGATIVE
Nitrite, UA: NEGATIVE
Protein, UA: NEGATIVE
SPEC GRAV UA: 1.015
Urobilinogen, UA: 0.2
pH, UA: 5

## 2013-12-14 MED ORDER — SULFAMETHOXAZOLE-TRIMETHOPRIM 800-160 MG PO TABS: 1.0000 | ORAL_TABLET | Freq: Two times a day (BID) | ORAL | Status: DC

## 2013-12-14 NOTE — Progress Notes (Addendum)
Patient ID: Terry [name], male   DOB: March 05, 1933, 80 y.o.   MRN: [id number]  Patient Active Problem List   Diagnosis Date Noted  . Tubulovillous adenoma of colon 06/28/2011    Priority: High  . Boil of upper extremity 12/15/2013  . Acquired bladder diverticulum 11/22/2013  . S/P appendectomy 11/22/2013  . Loss of weight 11/12/2013  . Abdominal pain 11/12/2013  . Enlarged prostate with urinary retention 11/12/2013  . Lower GI bleeding 12/17/2012  . Health care maintenance 11/12/2012  . Other malaise and fatigue 10/02/2012  . Hypoxemia 10/02/2012  . Ear lesion 06/12/2012  . COPD, moderate 12/22/2011  . Hyperlipidemia with target LDL less than 70 06/28/2011  . Hypertension 06/28/2011  . Hyperthyroidism 06/28/2011  . History of esophagitis 06/28/2011  . Hip pain, acute, right 06/28/2011  . History of alcohol abuse 06/28/2011  . Diabetes mellitus type 2, noninsulin dependent   . Anemia     Subjective:  CC:   Chief Complaint  Patient presents with  . Acute Visit    Nodule to left arm on neare deltoid muscle patient stated appeared after flu shot.    HPI:   Terry [name] is a 80 y.o. male who presents for   Worked in urgently to evaluate an abnormal skin finding,  Patient received annual influenza vaccine to left upper deltoid region on OCT 15 TH and developed a  Boil several days later which was red and drained spontaneously.  He was treated empirically with keflex for a week and the swelling improved but did not resolve , and once the antibiotics were finished the boil enlarged again .  He denies manipulation of boil,  And denies fevers .  The boil is painless,  Bu his wife became concerned about it,  2) persistent pelvic pain .  He was noted to have a 4 cm posterior bladder diverticulum on the left with enlarged prostate making an indentation on bladder by recent CT of and and pelvis done Oct 22 at [facility].  No adenopathy noted.  Some diverticulosis noted as well.  Patient was  started on Flomax in  late October and is scheduled to see Urology  In late November. Symptoms improved transiently while on keflex,  And he denies diarrhea or change in bowel habits.  UA today is normal.   Past Medical History  Diagnosis Date  . Diabetes mellitus   . hyperthyroidism   . Hyperlipidemia   . Hypertension   . Anemia     Past Surgical History  Procedure Laterality Date  . Cataract extraction, bilateral  2012    Porfilio  . Colonoscopy w/ polypectomy  2011    Dr. Raina Mina  . Appendectomy  1970  . Colonoscopy  12-10-12       The following portions of the patient's history were reviewed and updated as appropriate: Allergies, current medications, and problem list.    Review of Systems:   Patient denies headache, fevers, malaise, unintentional weight loss, skin rash, eye pain, sinus congestion and sinus pain, sore throat, dysphagia,  hemoptysis , cough, dyspnea, wheezing, chest pain, palpitations, orthopnea, edema, abdominal pain, nausea, melena, diarrhea, constipation, flank pain, dysuria, hematuria, urinary  Frequency, nocturia, numbness, tingling, seizures,  Focal weakness, Loss of consciousness,  Tremor, insomnia, depression, anxiety, and suicidal ideation.     History   Social History  . Marital Status: Married    Spouse Name: N/A    Number of Children: N/A  . Years of Education: N/A  Occupational History  . Not on file.   Social History Main Topics  . Smoking status: Former Smoker -- 2.00 packs/day for 50 years    Types: Cigarettes    Quit date: 06/26/1999  . Smokeless tobacco: Never Used  . Alcohol Use: 15.0 oz/week    25 Glasses of wine per week  . Drug Use: No  . Sexual Activity: Yes   Other Topics Concern  . Not on file   Social History Narrative    Objective:  Filed Vitals:   12/14/13 1536  BP: 130/66  Pulse: 73  Temp: 98.3 F (36.8 C)  Resp: 16     General appearance: alert, cooperative and appears stated age Ears: normal  TM's and external ear canals both ears Throat: lips, mucosa, and tongue normal; teeth and gums normal Neck: no adenopathy, no carotid bruit, supple, symmetrical, trachea midline and thyroid not enlarged, symmetric, no tenderness/mass/nodules Back: symmetric, no curvature. ROM normal. No CVA tenderness. Lungs: clear to auscultation bilaterally Heart: regular rate and rhythm, S1, S2 normal, no murmur, click, rub or gallop Abdomen: soft, non-tender; bowel sounds normal; no masses,  no organomegaly Pulses: 2+ and symmetric Skin:  1.6 cm red boil with clogged punctum,  Left deltoid region  .  No cellulitis or lymphangitis  Lymph nodes: Cervical, supraclavicular, and axillary nodes normal.  Assessment and Plan:  Boil of upper extremity Given transient with keflex,  Suspect persistetn infection with CA  MRSA.  Changing abx to Septra Ds, for empiric coverage of  MRSA and empiric treatment of possible UTI.  Continue warm compresses  And refer to Gen Surg for I & D  Use of probiotics advised.   Enlarged prostate with urinary retention contineu flomax.  Post void residual/bladder scan ordered  Urology referral made,  appt is late November.   Acquired bladder diverticulum 4cm, posteriori on left , Likely secondary to persistent partial obstruction from BPH.  Bladder scan ordered. continue flomax,  Await Urology eval.  UA is normal today  Tubulovillous adenoma of colon Recurrent , with multiple polyps including 2 cm resected during colonoscopy Sept 2836 complicated by persistentt bleeding requiring hospitalization.  Follow up with Gen Surg  Advised in 2016.     Updated Medication List Outpatient Encounter Prescriptions as of 12/14/2013  Medication Sig  . albuterol (PROVENTIL HFA;VENTOLIN HFA) 108 (90 BASE) MCG/ACT inhaler Inhale 2 puffs into the lungs every 6 (six) hours as needed for wheezing or shortness of breath.  Marland Kitchen b complex vitamins tablet Take 1 tablet by mouth daily.  . benazepril  (LOTENSIN) 40 MG tablet Take 1 tablet (40 mg total) by mouth daily.  . cephALEXin (KEFLEX) 500 MG capsule Take 1 capsule (500 mg total) by mouth 4 (four) times daily.  . Cholecalciferol (VITAMIN D3) 1000 UNITS CAPS Take by mouth.  Marland Kitchen CINNAMON PO Take by mouth.  . cyanocobalamin 1000 MCG tablet Take 1,000 mcg by mouth daily.  . Fluticasone-Salmeterol (ADVAIR DISKUS) 250-50 MCG/DOSE AEPB Inhale 1 puff into the lungs 2 (two) times daily.  Marland Kitchen glipiZIDE (GLUCOTROL XL) 2.5 MG 24 hr tablet TAKE 1 TABLET BY MOUTH EVERY DAY  . Ipratropium-Albuterol (COMBIVENT) 20-100 MCG/ACT AERS respimat Inhale 1 puff into the lungs every 6 (six) hours as needed for wheezing.  . methimazole (TAPAZOLE) 5 MG tablet Take 2.5 mg by mouth daily.  . metoprolol succinate (TOPROL-XL) 50 MG 24 hr tablet TAKE 1 TABLET (50 MG TOTAL) BY MOUTH DAILY. TAKE WITH OR IMMEDIATELY FOLLOWING A MEAL.  . metoprolol  succinate (TOPROL-XL) 50 MG 24 hr tablet TAKE 1 TABLET (50 MG TOTAL) BY MOUTH DAILY. TAKE WITH OR IMMEDIATELY FOLLOWING A MEAL.  . mometasone-formoterol (DULERA) 200-5 MCG/ACT AERO Inhale 2 puffs into the lungs 2 (two) times daily.  Marland Kitchen omeprazole (PRILOSEC) 40 MG capsule Take 1 capsule (40 mg total) by mouth daily.  . sildenafil (REVATIO) 20 MG tablet Take 1 tablet (20 mg total) by mouth 3 (three) times daily.  . simvastatin (ZOCOR) 40 MG tablet TAKE 1 TABLET BY MOUTH AT BEDTIME  . tamsulosin (FLOMAX) 0.4 MG CAPS capsule Take 1 capsule (0.4 mg total) by mouth daily.  . Tiotropium Bromide Monohydrate (SPIRIVA RESPIMAT) 2.5 MCG/ACT AERS Inhale 2 puffs into the lungs daily.  . traMADol (ULTRAM) 50 MG tablet Take 1 tablet (50 mg total) by mouth every 6 (six) hours as needed.  . [DISCONTINUED] predniSONE (STERAPRED UNI-PAK) 10 MG tablet 6 tablets on Day 1 , then reduce by 1 tablet daily until gone  . sulfamethoxazole-trimethoprim (BACTRIM DS,SEPTRA DS) 800-160 MG per tablet Take 1 tablet by mouth 2 (two) times daily.     Orders Placed  This Encounter  Procedures  . Bladder Scan ([facility])  . Ambulatory referral to General Surgery  . POCT urinalysis dipstick    No Follow-up on file.

## 2013-12-14 NOTE — Patient Instructions (Addendum)
I am prescribing a second round of antibiotics for  your skin  infection .  It is called Septra DS  Take it twice daily with food,  Please take a probiotic ( Align, Floraque or Culturelle) for 2 weeks if you start the antibiotic to prevent a serious antibiotic associated diarrhea  Called" clostridium dificile colitis" ( should also help prevent   vaginal yeast infection)   You may use warm compresses on the cyst, but do not squeeze it  Referral to Dr Bary Castilla.Dr Jamal Collin to have it incised and drained  So it does not keep recurring

## 2013-12-14 NOTE — Progress Notes (Signed)
Pre-visit discussion using our clinic review tool. No additional management support is needed unless otherwise documented below in the visit note.  

## 2013-12-15 ENCOUNTER — Encounter: Payer: Self-pay | Admitting: Internal Medicine

## 2013-12-15 DIAGNOSIS — L02429 Furuncle of limb, unspecified: Secondary | ICD-10-CM | POA: Insufficient documentation

## 2013-12-15 NOTE — Assessment & Plan Note (Signed)
4cm, posteriori on left , Likely secondary to persistent partial obstruction from BPH.  Bladder scan ordered. continue flomax,  Await Urology eval.  UA is normal today

## 2013-12-15 NOTE — Assessment & Plan Note (Addendum)
Recurrent , with multiple polyps including 2 cm resected during colonoscopy Sept 3496 complicated by persistentt bleeding requiring hospitalization.  Follow up with Gen Surg  Advised in 2016.

## 2013-12-15 NOTE — Assessment & Plan Note (Signed)
contineu flomax.  Post void residual/bladder scan ordered  Urology referral made,  appt is late November.

## 2013-12-15 NOTE — Assessment & Plan Note (Signed)
Given transient with keflex,  Suspect persistetn infection with CA  MRSA.  Changing abx to Septra Ds, for empiric coverage of  MRSA and empiric treatment of possible UTI.  Continue warm compresses  And refer to Gen Surg for I & D  Use of probiotics advised.

## 2013-12-16 ENCOUNTER — Ambulatory Visit (INDEPENDENT_AMBULATORY_CARE_PROVIDER_SITE_OTHER): Payer: Medicare Other | Admitting: General Surgery

## 2013-12-16 ENCOUNTER — Encounter: Payer: Self-pay | Admitting: General Surgery

## 2013-12-16 VITALS — BP 120/62 | HR 76 | Resp 12 | Ht 73.0 in | Wt 184.0 lb

## 2013-12-16 DIAGNOSIS — D0462 Carcinoma in situ of skin of left upper limb, including shoulder: Secondary | ICD-10-CM | POA: Diagnosis not present

## 2013-12-16 DIAGNOSIS — C44621 Squamous cell carcinoma of skin of unspecified upper limb, including shoulder: Secondary | ICD-10-CM | POA: Diagnosis not present

## 2013-12-16 DIAGNOSIS — R2230 Localized swelling, mass and lump, unspecified upper limb: Secondary | ICD-10-CM | POA: Insufficient documentation

## 2013-12-16 DIAGNOSIS — R2232 Localized swelling, mass and lump, left upper limb: Secondary | ICD-10-CM | POA: Diagnosis not present

## 2013-12-16 NOTE — Patient Instructions (Signed)
Keep area clean 

## 2013-12-16 NOTE — Progress Notes (Signed)
Patient ID: Terry [name], male   DOB: 08-Sep-1933, 80 y.o.   MRN: [id number]  Chief Complaint  Patient presents with  . Other    inflammed cyst on left upper arm     HPI Terry [name] is a 80 y.o. male who presents for an evaluation of an inflammed left upper arm cyst. The patient states he noticed this area approximately the middle of October after receiving his flu shot. The area has stayed the same in size. He notices pain in this area with touch. No drainage noted. He is currently on his second course of antibiotics.   HPI  Past Medical History  Diagnosis Date  . Diabetes mellitus   . hyperthyroidism   . Hyperlipidemia   . Hypertension   . Anemia     Past Surgical History  Procedure Laterality Date  . Cataract extraction, bilateral  2012    Porfilio  . Colonoscopy w/ polypectomy  2011    Dr. Raina Mina  . Appendectomy  1970  . Colonoscopy  12-10-12    Family History  Problem Relation Age of Onset  . Diabetes Maternal Aunt   . Hyperlipidemia Maternal Uncle   . Birth defects Neg Hx     Social History History  Substance Use Topics  . Smoking status: Former Smoker -- 2.00 packs/day for 50 years    Types: Cigarettes    Quit date: 06/26/1999  . Smokeless tobacco: Never Used  . Alcohol Use: 15.0 oz/week    25 Glasses of wine per week    No Known Allergies  Current Outpatient Prescriptions  Medication Sig Dispense Refill  . b complex vitamins tablet Take 1 tablet by mouth daily.    . benazepril (LOTENSIN) 40 MG tablet Take 1 tablet (40 mg total) by mouth daily. 90 tablet 3  . cephALEXin (KEFLEX) 500 MG capsule Take 1 capsule (500 mg total) by mouth 4 (four) times daily. 28 capsule 0  . Cholecalciferol (VITAMIN D3) 1000 UNITS CAPS Take by mouth.    Marland Kitchen CINNAMON PO Take by mouth.    . cyanocobalamin 1000 MCG tablet Take 1,000 mcg by mouth daily.    . enalapril (VASOTEC) 20 MG tablet Take 20 mg by mouth daily.     Marland Kitchen glipiZIDE (GLUCOTROL XL) 2.5 MG 24 hr tablet TAKE 1  TABLET BY MOUTH EVERY DAY 90 tablet 2  . methimazole (TAPAZOLE) 5 MG tablet Take 2.5 mg by mouth daily.    . metoprolol succinate (TOPROL-XL) 50 MG 24 hr tablet TAKE 1 TABLET (50 MG TOTAL) BY MOUTH DAILY. TAKE WITH OR IMMEDIATELY FOLLOWING A MEAL. 90 tablet 1  . simvastatin (ZOCOR) 40 MG tablet TAKE 1 TABLET BY MOUTH AT BEDTIME 30 tablet 5  . tamsulosin (FLOMAX) 0.4 MG CAPS capsule Take 1 capsule (0.4 mg total) by mouth daily. 30 capsule 3   No current facility-administered medications for this visit.    Review of Systems Review of Systems  Constitutional: Negative.   Respiratory: Negative.   Cardiovascular: Negative.     Blood pressure 120/62, pulse 76, resp. rate 12, height 6\' 1"  (1.854 m), weight 184 lb (83.462 kg).  Physical Exam Physical Exam  Constitutional: He is oriented to person, place, and time. He appears well-developed and well-nourished.  Musculoskeletal:       Arms: Lymphadenopathy:    He has no axillary adenopathy.  Neurological: He is alert and oriented to person, place, and time.  Skin: Skin is warm and dry.    Data Reviewed  PCP notes.  Assessment    Keratoacanthoma of the left upper extremity.    Plan    Excision was recommended to resolve the process. 10 mL of 0.5% Xylocaine with 0.25% Marcaine with 1-200,000 units of epinephrine was utilized well tolerated. ChloraPrep was applied to the skin. Through a transverse incision the area was excised with a 1 mm border. A suture was placed at the 12:00 position for orientation. The deep tissue was approximated with 3-0 Vicryl suture after hemostasis was achieved with 3-0 Vicryl suture ligatures and ties. The skin was closed with a running 4-0 nylon suture. Telfa and Tegaderm dressing applied.  Appearance the patient does not need to continue his antibiotic therapy at this time.  He'll return in one week for suture removal.     PCP/Ref MD:  Mattie Marlin 12/16/2013, 8:01 PM

## 2013-12-21 ENCOUNTER — Encounter: Payer: Self-pay | Admitting: Internal Medicine

## 2013-12-21 LAB — PATHOLOGY

## 2013-12-23 ENCOUNTER — Ambulatory Visit (INDEPENDENT_AMBULATORY_CARE_PROVIDER_SITE_OTHER): Payer: Self-pay | Admitting: *Deleted

## 2013-12-23 DIAGNOSIS — R2232 Localized swelling, mass and lump, left upper limb: Secondary | ICD-10-CM

## 2013-12-23 NOTE — Progress Notes (Signed)
The sutures were removed and steri strips applied. 

## 2013-12-28 DIAGNOSIS — R339 Retention of urine, unspecified: Secondary | ICD-10-CM | POA: Diagnosis not present

## 2013-12-28 DIAGNOSIS — N323 Diverticulum of bladder: Secondary | ICD-10-CM | POA: Diagnosis not present

## 2014-01-19 DIAGNOSIS — E119 Type 2 diabetes mellitus without complications: Secondary | ICD-10-CM | POA: Diagnosis not present

## 2014-01-19 DIAGNOSIS — E0590 Thyrotoxicosis, unspecified without thyrotoxic crisis or storm: Secondary | ICD-10-CM | POA: Diagnosis not present

## 2014-01-19 DIAGNOSIS — E538 Deficiency of other specified B group vitamins: Secondary | ICD-10-CM | POA: Diagnosis not present

## 2014-01-29 HISTORY — PX: ARM SKIN LESION BIOPSY / EXCISION: SUR471

## 2014-02-27 ENCOUNTER — Other Ambulatory Visit: Payer: Self-pay | Admitting: Internal Medicine

## 2014-03-03 ENCOUNTER — Other Ambulatory Visit: Payer: Self-pay | Admitting: Internal Medicine

## 2014-03-21 ENCOUNTER — Other Ambulatory Visit: Payer: Self-pay | Admitting: Internal Medicine

## 2014-03-23 ENCOUNTER — Ambulatory Visit (INDEPENDENT_AMBULATORY_CARE_PROVIDER_SITE_OTHER): Payer: Medicare Other | Admitting: General Surgery

## 2014-03-23 ENCOUNTER — Encounter: Payer: Self-pay | Admitting: General Surgery

## 2014-03-23 VITALS — BP 120/72 | HR 74 | Resp 14 | Ht 73.0 in | Wt 180.0 lb

## 2014-03-23 DIAGNOSIS — R2232 Localized swelling, mass and lump, left upper limb: Secondary | ICD-10-CM | POA: Diagnosis not present

## 2014-03-23 NOTE — Progress Notes (Signed)
Patient ID: Terry [name], male   DOB: 1933-12-24, 81 y.o.   MRN: [id number]  Chief Complaint  Patient presents with  . Follow-up    3 month left arm excision    HPI Terry [name] is a 80 y.o. male. here today for his 3 month follow up left arm excision that was performed on 12/16/13. Patient states he is doing well with no complaints.  HPI  Past Medical History  Diagnosis Date  . Diabetes mellitus   . hyperthyroidism   . Hyperlipidemia   . Hypertension   . Anemia   . 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    Past Surgical History  Procedure Laterality Date  . Cataract extraction, bilateral  2012    Porfilio  . Colonoscopy w/ polypectomy  2011    Dr. Raina Mina  . Appendectomy  1970  . Colonoscopy  12-10-12    Family History  Problem Relation Age of Onset  . Diabetes Maternal Aunt   . Hyperlipidemia Maternal Uncle   . Birth defects Neg Hx     Social History History  Substance Use Topics  . Smoking status: Former Smoker -- 2.00 packs/day for 50 years    Types: Cigarettes    Quit date: 06/26/1999  . Smokeless tobacco: Never Used  . Alcohol Use: 15.0 oz/week    25 Glasses of wine per week    No Known Allergies  Current Outpatient Prescriptions  Medication Sig Dispense Refill  . b complex vitamins tablet Take 1 tablet by mouth daily.    . benazepril (LOTENSIN) 40 MG tablet TAKE 1 TABLET (40 MG TOTAL) BY MOUTH DAILY. 90 tablet 1  . Cholecalciferol (VITAMIN D3) 1000 UNITS CAPS Take by mouth.    Marland Kitchen CINNAMON PO Take by mouth.    . cyanocobalamin 1000 MCG tablet Take 1,000 mcg by mouth daily.    . enalapril (VASOTEC) 20 MG tablet Take 20 mg by mouth daily.     Marland Kitchen glipiZIDE (GLUCOTROL XL) 2.5 MG 24 hr tablet TAKE 1 TABLET BY MOUTH EVERY DAY 90 tablet 2  . methimazole (TAPAZOLE) 5 MG tablet Take 2.5 mg by mouth daily.    . metoprolol succinate (TOPROL-XL) 50 MG 24 hr tablet Take 1  tablet (50 mg total) by mouth daily. PLEASE CALL OFFICE FOR APPT BEFORE THIS RUNS OUT 90 tablet 0  . omeprazole (PRILOSEC) 40 MG capsule TAKE ONE CAPSULE BY MOUTH EVERY DAY 30 capsule 3  . simvastatin (ZOCOR) 40 MG tablet TAKE 1 TABLET BY MOUTH AT BEDTIME 30 tablet 5  . tamsulosin (FLOMAX) 0.4 MG CAPS capsule TAKE 1 CAPSULE (0.4 MG TOTAL) BY MOUTH DAILY. 30 capsule 3   No current facility-administered medications for this visit.    Review of Systems Review of Systems  Constitutional: Negative.   Respiratory: Negative.   Cardiovascular: Negative.     Blood pressure 120/72, pulse 74, resp. rate 14, height 6\' 1"  (1.854 m), weight 180 lb (81.647 kg).  Physical Exam Physical Exam  Constitutional: He is oriented to person, place, and time. He appears well-developed and well-nourished.  Lymphadenopathy:    He has no cervical adenopathy.    He has no axillary adenopathy.       Left: No supraclavicular adenopathy present.  Neurological: He is alert and oriented to person, place, and time.  Skin: Skin is warm.     Left arm incision looks clean and well healed.  Data Reviewed MASS, UPPER ARM, LEFT:  - WELL-DIFFERENTIATED SQUAMOUS CELL CARCINOMA, KERATOACANTHOMA  TYPE.  - THE MARGINS OF EXCISION ARE NEGATIVE.  XDB/12/21/2013   Assessment    No evidence of recurrent squamous cell carcinoma or metastatic disease.    Plan     Patient to return in November 2016 for recheck.   PCP:  Mattie Marlin 03/24/2014, 8:54 PM

## 2014-03-23 NOTE — Patient Instructions (Signed)
Patient to return in November 2016 for recheck.

## 2014-03-24 ENCOUNTER — Encounter: Payer: Self-pay | Admitting: General Surgery

## 2014-04-28 DIAGNOSIS — E0590 Thyrotoxicosis, unspecified without thyrotoxic crisis or storm: Secondary | ICD-10-CM | POA: Diagnosis not present

## 2014-04-28 DIAGNOSIS — E119 Type 2 diabetes mellitus without complications: Secondary | ICD-10-CM | POA: Diagnosis not present

## 2014-04-28 LAB — BASIC METABOLIC PANEL
BUN: 19 mg/dL (ref 4–21)
Creatinine: 1.2 mg/dL (ref 0.6–1.3)
Potassium: 5 mmol/L (ref 3.4–5.3)
Sodium: 139 mmol/L (ref 137–147)

## 2014-04-28 LAB — TSH: TSH: 1.45 u[IU]/mL (ref 0.41–5.90)

## 2014-04-28 LAB — HEMOGLOBIN A1C: Hgb A1c MFr Bld: 6.9 % — AB (ref 4.0–6.0)

## 2014-05-04 DIAGNOSIS — E119 Type 2 diabetes mellitus without complications: Secondary | ICD-10-CM | POA: Diagnosis not present

## 2014-05-04 DIAGNOSIS — E0590 Thyrotoxicosis, unspecified without thyrotoxic crisis or storm: Secondary | ICD-10-CM | POA: Diagnosis not present

## 2014-05-04 DIAGNOSIS — Z87898 Personal history of other specified conditions: Secondary | ICD-10-CM | POA: Diagnosis not present

## 2014-05-04 DIAGNOSIS — E538 Deficiency of other specified B group vitamins: Secondary | ICD-10-CM | POA: Diagnosis not present

## 2014-05-21 NOTE — H&P (Signed)
   Alric Seton MD ELECTRONICALLY SIGNED 12/12/2012 16:59

## 2014-05-21 NOTE — H&P (Signed)
PATIENT NAME:  Terry [name], Terry [name] MR#:  [id number] DATE OF BIRTH:  Nov 19, 1933  DATE OF ADMISSION:  12/12/2012  PRIMARY CARE PROVIDER: Dr. [name]  ED REFERRING PHYSICIAN: Dr. Mariea Clonts.   CHIEF COMPLAINT: Dark-colored blood per rectum.   HISTORY OF PRESENT ILLNESS: The patient is a 81 year old Caucasian male with history of hypertension, diabetes and daily wine drinker who drinks about 1/2 bottle of wine on a daily basis. Also has history of hypertension who had a colonoscopy a few hours ago and they were not able to resect a polyp that was there so he underwent a colonoscopy again by Dr. Hervey Ard on 12/10/2012. The patient had two 5 mL polyps in the rectum resected, who states that he was doing okay up until this morning when he started having dark, maroon-colored stools. He has had 2 episodes of large bowel movements of these. He has not had any abdominal pain, nausea, vomiting or diarrhea. He denies any other symptoms of chest pain, shortness of breath, dizziness or syncope. He denies any previous history of GI bleeding. He does have a history of anemia, but his baseline hemoglobin is not available.   PAST MEDICAL HISTORY: 1.  Diabetes type 2.  2.  History of hypothyroidism. 3.  Hypertension.  4.  History of anemia.   PAST SURGICAL HISTORY: Appendectomy left ankle surgery.   ALLERGIES: None.   CURRENT MEDICATIONS:  Enalapril 20 daily, glipizide 2.5 p.o. daily, methimazole 5 mg daily, metoprolol 50 one 1 tab p.o. daily, Tylenol 650 q. 4 p.r.n.   SOCIAL HISTORY: Does not smoke. Drinks about 1/2 bottle of wine on a daily basis. Denies any drug use.   FAMILY HISTORY: Positive for hypertension.   REVIEW OF SYSTEMS: CONSTITUTIONAL: Denies any fevers, fatigue, weakness. No pain. No weight loss. No weight gain.  EYES: No blurred or double vision. No pain. No redness or inflammation. No glaucoma. No cataracts.  ENT: No tinnitus. No ear pain. No hearing loss. No seasonal or year-round  allergies. No difficulty swallowing.  RESPIRATORY: Denies any cough, wheezing, hemoptysis. No COPD. CARDIOVASCULAR: Denies any chest pain, orthopnea, edema or arrhythmia.  GASTROINTESTINAL: No nausea, vomiting, diarrhea. No abdominal pain. Complains of dark-colored blood via rectum.  GENITOURINARY: Denies any dysuria, hematuria, renal calculus or frequency.  ENDOCRINE: Denies any polyuria, nocturia. Does have hypothyroidism.  HEMATOLOGIC AND LYMPHATIC: Has a history of anemia. No easy bruisability or bleeding.  SKIN: No acne. No rash. No changes in mole, hair or skin.  MUSCULOSKELETAL: Denies any pain in the neck, back or shoulder.  NEUROLOGIC: No numbness. No CVA. No TIA.  PSYCHIATRIC: No anxiety. No insomnia. No ADD.   PHYSICAL EXAMINATION: VITAL SIGNS: Temperature 97.5, pulse 78, respirations 20, blood pressure 138/70, O2 95%.  GENERAL: The patient is a well-developed, well-nourished male in no acute distress.  HEENT: Head atraumatic, normocephalic. Pupils equally round and reactive to light and accommodation. There is no conjunctival pallor. No scleral icterus. Nasal exam shows no drainage or ulceration. Oropharynx is clear without any exudate.  NECK: Supple without any JVD.  CARDIOVASCULAR: Regular rate and rhythm. No murmurs, rubs, clicks or gallops. PMI is not displaced.  ABDOMEN: Soft, nontender, nondistended. Positive bowel sounds x4. No hepatosplenomegaly.  EXTREMITIES: No clubbing, cyanosis or edema.  SKIN: No rash.  LYMPHATICS: No lymph nodes palpable.  VASCULAR: Good DP and PT pulses.  PSYCHIATRIC: Not anxious or depressed.  NEUROLOGIC: Awake, alert and oriented x3. No focal deficits.   LABORATORY DATA: Glucose 175, BUN 18, creatinine 1.24,  sodium 139, potassium 4.1, chloride 109. CO2 is 23. Calcium 8.5. LFTs: Total protein 6.4, albumin 3.2, bili total 0.4, alk phos 62, AST 24, ALT 19. Troponin less than 0.02. WBC 5.9, hemoglobin 10.7 and platelet count is 243.   ASSESSMENT  AND PLAN: The patient is a 81 year old white male with history of hypertension and diabetes who underwent a colonoscopy 2 days prior with Dr. Bary Castilla presents with dark-colored stools since this morning.  1.  Hematochezia. Possibly related to recent polypectomy. Will be consulting Dr. Bary Castilla. Also have gastroenterology evaluate the patient as well due to dark-colored bleed, suggesting possible upper source. However, in light of relationship to the polypectomy, likely cause. We will follow his hemoglobin. Currently hemoglobin is stable. I have consented and explained the risks and benefits of the transfusion to the patient. He is agreeable, if his hemoglobin drops further. We will monitor his hemoglobin every 6 hours. Transfuse if it drops below 8. We will place him on IV Protonix twice daily.  2.  Alcohol abuse. Drinks 1/2 bottle of wine on a daily basis. Will place him on CIWA protocol. 3.  Hypertension. I will continue his enalapril. Use p.r.n. medication as needed. 4.  Diabetes. I will hold his glipizide in light of the patient will be on a clear liquid diet. I will place him on sliding scale insulin, monitor his blood sugars.  5.  Miscellaneous. The patient will have SCDs for deep vein thrombosis prophylaxis.  Of note, I tried to contact Dr. Bary Castilla. He is not on call. Dr. Tamala Julian is covering so I am waiting for a call back from him.    TIME SPENT: 50 minutes. ____________________________ Lafonda Mosses Posey Pronto, MD shp:sb D: 12/12/2012 14:33:02 ET T: 12/12/2012 14:50:44 ET JOB#: [id number]  cc: Ashleyanne Hemmingway H. Posey Pronto, MD, <Dictator> Alric Seton MD ELECTRONICALLY SIGNED 12/12/2012 17:00

## 2014-05-21 NOTE — Consult Note (Signed)
PATIENT NAME:  Terry [name], Terry [name] MR#:  [id number] DATE OF BIRTH:  11/08/1933  DATE OF CONSULTATION:  12/12/2012  CONSULTING PHYSICIAN:  Loreli Dollar, MD  HISTORY OF PRESENT ILLNESS: This 81 year old male came into the Emergency Room with chief complaint of passing a black bloody bowel movement I reviewed that he had colonoscopy done by Dr. Bary Castilla 2 days ago. and the patient reports he tolerated satisfactorily. Since the colonoscopy, he has been taking a regular diet and this morning felt the need to move his bowels and had a large black bowel movement, which covered the commode water. Did have some brief dizziness, and he lay down on his floor and improved and about 15 minutes later had another bloody bowel movement. The patient subsequently drove himself to the Emergency Room, where he was seen and evaluated and was admitted to the hospital by Dr. Posey Pronto and internal medicine. Was found to be anemic. The patient reports no abdominal pain. He reports that he has subsequently taken some liquids since admission, is having no nausea or vomiting. He has had no subsequent bowel movement and has been voiding satisfactorily.   I reviewed the colonoscopy note, which was recorded by Dr. Bary Castilla. There was a finding of a sessile polyp of the ascending colon, which was removed with lift technique with snare and cautery. There were also 2 small 5 mm polyps removed from the rectum, removed with snare and cautery. Also had findings of diverticulosis. The pathology of the ascending colon polyp demonstrated villous adenoma and the 2 polyps removed from the rectum demonstrated hyperplastic polyps.   PAST MEDICAL HISTORY:  1.  Had had polyps removed by colonoscopy in the past.  2.  Does report a history of hyperparathyroidism and on treatment and doing well.  3.  History of non-insulin-dependent type 2 diabetes mellitus.  4.  Hypertension.   PAST SURGICAL HISTORY: Includes appendectomy and left ankle surgery.    MEDICATIONS: Include enalapril 20 mg daily, glipizide 2.5 mg daily, methimazole 5 mg daily, metoprolol 50 mg daily, Tylenol as needed.   SOCIAL HISTORY: Does not smoke. Drinks a half a bottle of wine daily.   FAMILY HISTORY: Negative for colon cancer. Positive for hypertension.   REVIEW OF SYSTEMS: He reports no other recent acute illness such as cough, cold, sore throat. He reports no difficulty breathing. No chest pain. No abdominal pain. No urinary symptoms. No ankle edema.   PHYSICAL EXAMINATION: GENERAL: He is awake, alert and oriented, ambulating in his hospital room.  VITAL SIGNS: Temperature is 97.6, pulse 101, respirations 20, blood pressure 150/77, oxygen saturation 90% to 93%.  SKIN: Slightly pale, warm, dry. LUNGS: Lung sounds are clear. No respiratory distress.  HEART: Regular rhythm, S1, S2.  ABDOMEN: Soft, flat, nontender.  RECTAL: Exam not done, as patient did just have colonoscopy 2 days ago.  NEUROLOGIC: Awake, alert and oriented, moving all extremities.   LABORATORY DATA: Was reviewed. Glucose 175, creatinine 1.24, hemoglobin 10.7, platelet count 243,000. Pro time was 12.8 seconds.   IMPRESSION:  1.  Colonic bleeding.  2.  Anemia.  I discussed this with Dr. Posey Pronto earlier and also discussed with the patient and his wife. Would agree with a period of observation, IV fluids to maintain hydration, start out with clear liquid diet, do serial hemoglobins. I discussed with the patient the remote possibility of right colon surgery, but I suspect this will most likely resolve with time. In fact, the bleeding may have already resolved.   Would avoid  blood transfusions if possible.  Will see him tomorrow as well.   ____________________________ Lenna Sciara. Rochel Brome, MD jws:jcm D: 12/12/2012 18:11:01 ET T: 12/12/2012 21:08:24 ET JOB#: [id number]  cc: Loreli Dollar, MD, <Dictator> Loreli Dollar MD ELECTRONICALLY SIGNED 12/15/2012 17:14

## 2014-05-21 NOTE — Discharge Summary (Signed)
PATIENT NAME:  Terry [name], Terry [name] MR#:  [id number] DATE OF BIRTH:  December 01, 1933  DATE OF ADMISSION:  12/12/2012 DATE OF DISCHARGE:  12/14/2012  PRIMARY CARE PHYSICIAN: Deborra Medina, MD   HOSPITAL COURSE:  1.  This is a 81 year old male patient admitted for rectal bleed. The patient had a colonoscopy on November 12th by Dr. Bary Castilla. The patient had polyps in the rectum which were resected. Those were two 5 mm polyps and also one 20 mm polyp in the proximal ascending colon was resected, and then the patient comes in with dark blood from rectum. Look at the history and physical by Dr. Dustin Flock for full details. The patient had no abdominal pain, no nausea, no vomiting. The patient has no previous history of GI bleeds. Regarding his GI bleed, the patient was seen by a surgeon on call, Dr. Rochel Brome. He did not feel that the patient needs any GI evaluation and he was comfortable in managing. The patient remained hemodynamically stable. Hemoglobin initially was 10.7. The patient's hemoglobin eventually, at the time of discharge, it was 8.9. Did not have any further episodes of blood in stools, and the patient was seen by GI. Dr. Rochel Brome was comfortable in handling GI bleeding so the patient was not see by Dr. Rayann Heman. Dr. Tamala Julian said the patient will have some bleeding expected because of polypectomy, and the patient remained hemodynamically stable. He is aware that he needs to follow up with Dr. Bary Castilla and Dr. Tamala Julian agreed that the patient can go home as his hemoglobin was stable and the patient did not require any blood transfusions. The patient also tolerated full liquids with no abdominal pain. 2.  Colonic bleeding, likely thought to be due to recent polypectomy. The patient is asked to follow up with Dr. Bary Castilla tomorrow.  3.  Hypertension. The patient is on enalapril 20 mg daily, and he can continue that, and also he is on metoprolol 50 mg daily that he can continue.  4.  Diabetes mellitus type 2. He  is on glipizide 2.5 mg. That can be continued at discharge.  5.  Hypothyroidism. He is on methimazole 5 mg daily. Continue that.  6.  History of COPD. He was started on Combivent inhalers recently. Advised to continue on that.  7.  History of alcohol abuse. The patient drinks heavily every day. Advised to quit. The patient's wife says he has been drinking for years and he is not really willing to quit. Advised him to quit drinking heavily and advised him to continue PPI, omeprazole 20 mg daily. The patient also was started on CIWA protocol, but he did not go into withdrawal while he was here.   DISCHARGE DIAGNOSES: 1.  Gastrointestinal bleed secondary to residual bleeding from polypectomy.  2.  Hypertension.  3.  Diabetes mellitus, type 2.  4.  Alcohol abuse.  5.  Hypothyroidism.   DISCHARGE MEDICATIONS: 1.  Metoprolol 50 mg p.o. daily. 2.  Glipizide XL 2.5 mg p.o. daily. 3.  Enalapril 20 mg p.o. daily. 4.  Methimazole 5 mg p.o. daily. 5.   Simvastatin 40 mg p.o. daily. 6.  Vitamin B12 1000 mcg p.o. daily. 7.  Vitamin B 100 mg p.o. daily. 8.  Vitamin D3 400 international units 1 capsule daily. 9.  (cinnamon 500 mg p.o. 2 capsules p.o. b.i.d.  10.  Combivent 100/20 mcg 1 puff b.i.d.  11.  Omeprazole 20 mg p.o. daily.   DIET: Low-sodium, ADA.  DISCHARGE INSTRUCTIONS: The patient needs to follow  up with his primary doctor, Dr. [name], and also follow up with surgeon, Dr. Bary Castilla.  TIME SPENT: More than 30 minutes.  ____________________________ Epifanio Lesches, MD sk:sb D: 12/15/2012 08:59:47 ET T: 12/15/2012 10:03:12 ET JOB#: [id number]  cc: Epifanio Lesches, MD, <Dictator> Epifanio Lesches MD ELECTRONICALLY SIGNED 12/28/2012 21:24

## 2014-05-21 NOTE — Consult Note (Signed)
I have spoken with Dr Rochel Brome regarding this case.  He is comfortable handling the GI bleed at this time and does not feel GI involvement is necessary.  He will let me know if assistance of gastroenterology becomes needed.   For now, I will not consult on Mr Wendt.    Electronic Signatures: Arther Dames (MD)  (Signed on 15-Nov-14 12:00)  Authored  Last Updated: 15-Nov-14 12:00 by Arther Dames (MD)

## 2014-06-21 ENCOUNTER — Other Ambulatory Visit: Payer: Self-pay | Admitting: Internal Medicine

## 2014-06-24 ENCOUNTER — Other Ambulatory Visit: Payer: Self-pay | Admitting: Internal Medicine

## 2014-06-24 NOTE — Telephone Encounter (Signed)
Sent mychart on need for appt 

## 2014-07-05 ENCOUNTER — Ambulatory Visit (INDEPENDENT_AMBULATORY_CARE_PROVIDER_SITE_OTHER): Payer: Medicare Other | Admitting: Internal Medicine

## 2014-07-05 VITALS — BP 110/62 | HR 74 | Temp 97.9°F | Resp 18 | Ht 73.0 in | Wt 181.8 lb

## 2014-07-05 DIAGNOSIS — E119 Type 2 diabetes mellitus without complications: Secondary | ICD-10-CM | POA: Diagnosis not present

## 2014-07-05 DIAGNOSIS — J449 Chronic obstructive pulmonary disease, unspecified: Secondary | ICD-10-CM | POA: Diagnosis not present

## 2014-07-05 DIAGNOSIS — I10 Essential (primary) hypertension: Secondary | ICD-10-CM

## 2014-07-05 DIAGNOSIS — E785 Hyperlipidemia, unspecified: Secondary | ICD-10-CM

## 2014-07-05 DIAGNOSIS — R0902 Hypoxemia: Secondary | ICD-10-CM

## 2014-07-05 LAB — LIPID PANEL
Cholesterol: 161 mg/dL (ref 0–200)
HDL: 64.2 mg/dL (ref >39.00)
LDL Cholesterol: 64 mg/dL (ref 0–99)
NONHDL: 96.8
TRIGLYCERIDES: 164 mg/dL — AB (ref 0.0–149.0)
Total CHOL/HDL Ratio: 3
VLDL: 32.8 mg/dL (ref 0.0–40.0)

## 2014-07-05 LAB — COMPREHENSIVE METABOLIC PANEL
ALK PHOS: 62 U/L (ref 39–117)
ALT: 11 U/L (ref 0–53)
AST: 16 U/L (ref 0–37)
Albumin: 4.3 g/dL (ref 3.5–5.2)
BILIRUBIN TOTAL: 0.7 mg/dL (ref 0.2–1.2)
BUN: 27 mg/dL — ABNORMAL HIGH (ref 6–23)
CO2: 26 mEq/L (ref 19–32)
Calcium: 9.7 mg/dL (ref 8.4–10.5)
Chloride: 105 mEq/L (ref 96–112)
Creatinine, Ser: 1.52 mg/dL — ABNORMAL HIGH (ref 0.40–1.50)
GFR: 47.21 mL/min — AB (ref >60.00)
GLUCOSE: 96 mg/dL (ref 70–99)
Potassium: 4.8 mEq/L (ref 3.5–5.1)
Sodium: 137 mEq/L (ref 135–145)
TOTAL PROTEIN: 6.8 g/dL (ref 6.0–8.3)

## 2014-07-05 NOTE — Patient Instructions (Addendum)
The Advair and the Spiriva are meant to be used daily (Advair is twice daily) to keep you out of the ER . Check to see if you have these at home  Your fatigue at work is due to the cumulative effects of low oxygen   We can qualify you again for supplemental oxygen

## 2014-07-05 NOTE — Progress Notes (Signed)
Subjective:  Patient ID: Terry [name], male    DOB: Mar 24, 1933  Age: 81 y.o. MRN: [id number]  CC: The primary encounter diagnosis was Hyperlipidemia. Diagnoses of Essential hypertension, COPD, moderate, Diabetes mellitus type 2, noninsulin dependent, Hyperlipidemia with target LDL less than 70, and Hypoxemia were also pertinent to this visit.  HPI Terry [name] presents for follow up on hypertension, COPD with hypoxia, and hyperlipidemia associated with  type 2 DM.  Last seen 8 months ago.  Most recent A1c was done by Dr [name] in April and was < 7.0.  Urine microal/cr ratio was normal Dec 2015 per Dr [name].   CC: fatigue; patient feels fine, but notes that by thre end of his 4 hour shift at [employer], he is worn out ,  Denies chest pain, headache but does not some mild dyspnea.  No longer wears supplemental oxygen despite medical advice to continue daytime use of 02. Ha sreturned the 02.  Not using any inhalers on a regular basis despite advanced COPD.  States that he was told to stop them by me.     Outpatient Prescriptions Prior to Visit  Medication Sig Dispense Refill  . b complex vitamins tablet Take 1 tablet by mouth daily.    . benazepril (LOTENSIN) 40 MG tablet TAKE 1 TABLET (40 MG TOTAL) BY MOUTH DAILY. 90 tablet 1  . Cholecalciferol (VITAMIN D3) 1000 UNITS CAPS Take by mouth.    Marland Kitchen CINNAMON PO Take by mouth.    . cyanocobalamin 1000 MCG tablet Take 1,000 mcg by mouth daily.    . enalapril (VASOTEC) 20 MG tablet Take 20 mg by mouth daily.     Marland Kitchen glipiZIDE (GLUCOTROL XL) 2.5 MG 24 hr tablet TAKE 1 TABLET BY MOUTH EVERY DAY 90 tablet 2  . methimazole (TAPAZOLE) 5 MG tablet Take 2.5 mg by mouth daily.    . metoprolol succinate (TOPROL-XL) 50 MG 24 hr tablet Take 1 tablet (50 mg total) by mouth daily. PLEASE CALL OFFICE FOR APPT BEFORE THIS RUNS OUT 90 tablet 0  . simvastatin (ZOCOR) 40 MG tablet TAKE 1 TABLET BY MOUTH AT BEDTIME 30 tablet 0  . tamsulosin (FLOMAX) 0.4 MG CAPS  capsule TAKE 1 CAPSULE (0.4 MG TOTAL) BY MOUTH DAILY. 30 capsule 3  . omeprazole (PRILOSEC) 40 MG capsule TAKE ONE CAPSULE BY MOUTH EVERY DAY (Patient not taking: Reported on 07/05/2014) 30 capsule 3  . metoprolol succinate (TOPROL-XL) 50 MG 24 hr tablet TAKE 1 TABLET BY MOUTH EVERY DAY -- PLEASE MAKE APPT BEFORE THIS RUNS OUT (Patient not taking: Reported on 07/05/2014) 30 tablet 0  . omeprazole (PRILOSEC) 40 MG capsule TAKE ONE CAPSULE BY MOUTH EVERY DAY (Patient not taking: Reported on 07/05/2014) 30 capsule 3  . tamsulosin (FLOMAX) 0.4 MG CAPS capsule TAKE 1 CAPSULE (0.4 MG TOTAL) BY MOUTH DAILY. (Patient not taking: Reported on 07/05/2014) 30 capsule 3   No facility-administered medications prior to visit.    Review of Systems;  Patient denies headache, fevers, malaise, unintentional weight loss, skin rash, eye pain, sinus congestion and sinus pain, sore throat, dysphagia,  hemoptysis , cough, dyspnea, wheezing, chest pain, palpitations, orthopnea, edema, abdominal pain, nausea, melena, diarrhea, constipation, flank pain, dysuria, hematuria, urinary  Frequency, nocturia, numbness, tingling, seizures,  Focal weakness, Loss of consciousness,  Tremor, insomnia, depression, anxiety, and suicidal ideation.      Objective:  BP 110/62 mmHg  Pulse 74  Temp(Src) 97.9 F (36.6 C)  Resp 18  Ht 6\' 1"  (  1.854 m)  Wt 181 lb 12.8 oz (82.464 kg)  BMI 23.99 kg/m2  SpO2 91%  BP Readings from Last 3 Encounters:  07/05/14 110/62  03/23/14 120/72  12/16/13 120/62    Wt Readings from Last 3 Encounters:  07/05/14 181 lb 12.8 oz (82.464 kg)  03/23/14 180 lb (81.647 kg)  12/16/13 184 lb (83.462 kg)    General appearance: alert, cooperative and appears stated age Ears: normal TM's and external ear canals both ears Throat: lips, mucosa, and tongue normal; teeth and gums normal Neck: no adenopathy, no carotid bruit, supple, symmetrical, trachea midline and thyroid not enlarged, symmetric, no  tenderness/mass/nodules Back: symmetric, no curvature. ROM normal. No CVA tenderness. Lungs: limited air movement  But clear to auscultation bilaterally Heart: regular rate and rhythm, S1, S2 normal, no murmur, click, rub or gallop Abdomen: soft, non-tender; bowel sounds normal; no masses,  no organomegaly Pulses: 2+ and symmetric Skin: Skin color, texture, turgor normal. No rashes or lesions Lymph nodes: Cervical, supraclavicular, and axillary nodes normal.   No results found.  Assessment & Plan:   Problem List Items Addressed This Visit    Hyperlipidemia with target LDL less than 29    Well controlled on current statin therapy.   Liver enzymes are normal , no changes today.  Lab Results  Component Value Date   CHOL 161 07/05/2014   HDL 64.20 07/05/2014   LDLCALC 64 07/05/2014   LDLDIRECT 81.5 10/02/2012   TRIG 164.0* 07/05/2014   CHOLHDL 3 07/05/2014   Lab Results  Component Value Date   ALT 11 07/05/2014   AST 16 07/05/2014   ALKPHOS 62 07/05/2014   BILITOT 0.7 07/05/2014           Diabetes mellitus type 2, noninsulin dependent    Well controlled, managed by Dr. [name]. Foot exam was normal today.He is on an ACE inhibitor.  Potassium is normal.   Lab Results  Component Value Date   HGBA1C 6.9* 04/28/2014    Lab Results  Component Value Date   MICROALBUR 1.5 11/12/2013     Lab Results  Component Value Date   NA 137 07/05/2014   K 4.8 07/05/2014   CL 105 07/05/2014   CO2 26 07/05/2014            COPD, moderate    Patient advised to resunme use of Advair and Spiriva daily  As well as resume use of supplemental 02,  Which he is not interested in doing at his time. He has a  Science writer to [place] in the near future for a vacation cruise, but does not want to bring 02 on board  He flew to [place] last year without supplemental 02 .       Hypoxemia    He has chosen to remain hypoxic rather than wear supplemental 02.  I continue to advise him to resume 02 for  health reasons which he continues to defer        Hypertension   Relevant Orders   Comprehensive metabolic panel (Completed)    Other Visit Diagnoses    Hyperlipidemia    -  Primary    Relevant Orders    Lipid panel (Completed)     A total of 40 minutes was spent with patient more than half of which was spent in counseling patient on the above mentioned issues , reviewing and explaining recent labs and imaging studies done, and coordination of care.  I am having Mr. Beulah Gandy maintain his methimazole, Vitamin  D3, CINNAMON PO, cyanocobalamin, b complex vitamins, glipiZIDE, enalapril, benazepril, tamsulosin, omeprazole, metoprolol succinate, and simvastatin.  No orders of the defined types were placed in this encounter.    Medications Discontinued During This Encounter  Medication Reason  . metoprolol succinate (TOPROL-XL) 50 MG 24 hr tablet   . omeprazole (PRILOSEC) 40 MG capsule   . tamsulosin (FLOMAX) 0.4 MG CAPS capsule     Follow-up: Return in about 6 months (around 01/04/2015).   [name], MD

## 2014-07-05 NOTE — Progress Notes (Signed)
Pre visit review using our clinic review tool, if applicable. No additional management support is needed unless otherwise documented below in the visit note. 

## 2014-07-06 ENCOUNTER — Encounter: Payer: Self-pay | Admitting: Internal Medicine

## 2014-07-06 NOTE — Assessment & Plan Note (Signed)
He has chosen to remain hypoxic rather than wear supplemental 02.  I continue to advise him to resume 02 for health reasons which he continues to defer

## 2014-07-06 NOTE — Assessment & Plan Note (Signed)
Well controlled on current statin therapy.   Liver enzymes are normal , no changes today.  Lab Results  Component Value Date   CHOL 161 07/05/2014   HDL 64.20 07/05/2014   LDLCALC 64 07/05/2014   LDLDIRECT 81.5 10/02/2012   TRIG 164.0* 07/05/2014   CHOLHDL 3 07/05/2014   Lab Results  Component Value Date   ALT 11 07/05/2014   AST 16 07/05/2014   ALKPHOS 62 07/05/2014   BILITOT 0.7 07/05/2014

## 2014-07-06 NOTE — Assessment & Plan Note (Signed)
Patient advised to resunme use of Advair and Spiriva daily  As well as resume use of supplemental 02,  Which he is not interested in doing at his time. He has a  Science writer to [place] in the near future for a vacation cruise, but does not want to bring 02 on board  He flew to [place] last year without supplemental 02 .

## 2014-07-06 NOTE — Assessment & Plan Note (Signed)
Well controlled, managed by Dr. [name]. Foot exam was normal today.He is on an ACE inhibitor.  Potassium is normal.   Lab Results  Component Value Date   HGBA1C 6.9* 04/28/2014    Lab Results  Component Value Date   MICROALBUR 1.5 11/12/2013     Lab Results  Component Value Date   NA 137 07/05/2014   K 4.8 07/05/2014   CL 105 07/05/2014   CO2 26 07/05/2014

## 2014-08-01 ENCOUNTER — Other Ambulatory Visit: Payer: Self-pay | Admitting: Internal Medicine

## 2014-08-17 ENCOUNTER — Telehealth: Payer: Self-pay | Admitting: Internal Medicine

## 2014-08-17 MED ORDER — METOPROLOL SUCCINATE ER 50 MG PO TB24: 50.0000 mg | ORAL_TABLET | Freq: Every day | ORAL | Status: DC

## 2014-08-17 NOTE — Telephone Encounter (Signed)
Patient wife called for refill on Metoprolol refill sent.

## 2014-10-31 ENCOUNTER — Other Ambulatory Visit: Payer: Self-pay | Admitting: Internal Medicine

## 2014-11-05 DIAGNOSIS — E0590 Thyrotoxicosis, unspecified without thyrotoxic crisis or storm: Secondary | ICD-10-CM | POA: Diagnosis not present

## 2014-11-05 DIAGNOSIS — E538 Deficiency of other specified B group vitamins: Secondary | ICD-10-CM | POA: Diagnosis not present

## 2014-11-05 DIAGNOSIS — E119 Type 2 diabetes mellitus without complications: Secondary | ICD-10-CM | POA: Diagnosis not present

## 2014-11-05 LAB — HEMOGLOBIN A1C: HEMOGLOBIN A1C: 6.6

## 2014-11-09 DIAGNOSIS — E119 Type 2 diabetes mellitus without complications: Secondary | ICD-10-CM | POA: Diagnosis not present

## 2014-11-09 DIAGNOSIS — E0590 Thyrotoxicosis, unspecified without thyrotoxic crisis or storm: Secondary | ICD-10-CM | POA: Diagnosis not present

## 2014-11-09 DIAGNOSIS — Z87898 Personal history of other specified conditions: Secondary | ICD-10-CM | POA: Diagnosis not present

## 2014-11-09 DIAGNOSIS — E538 Deficiency of other specified B group vitamins: Secondary | ICD-10-CM | POA: Diagnosis not present

## 2014-12-10 ENCOUNTER — Other Ambulatory Visit: Payer: Self-pay | Admitting: Internal Medicine

## 2014-12-14 ENCOUNTER — Other Ambulatory Visit: Payer: Self-pay | Admitting: Internal Medicine

## 2014-12-16 ENCOUNTER — Encounter: Payer: Self-pay | Admitting: Internal Medicine

## 2014-12-17 MED ORDER — IPRATROPIUM-ALBUTEROL 18-103 MCG/ACT IN AERO: 2.0000 | INHALATION_SPRAY | Freq: Four times a day (QID) | RESPIRATORY_TRACT | Status: DC | PRN

## 2014-12-20 ENCOUNTER — Ambulatory Visit: Payer: Medicare Other | Admitting: General Surgery

## 2015-01-05 ENCOUNTER — Telehealth: Payer: Self-pay | Admitting: *Deleted

## 2015-01-05 NOTE — Telephone Encounter (Signed)
Patient would like to schedule a follow up appt this month with Dr. [name]. Please advise a 30 time slot for this patient

## 2015-01-10 ENCOUNTER — Encounter: Payer: Self-pay | Admitting: Internal Medicine

## 2015-01-17 ENCOUNTER — Ambulatory Visit (INDEPENDENT_AMBULATORY_CARE_PROVIDER_SITE_OTHER): Payer: Medicare Other | Admitting: Internal Medicine

## 2015-01-17 ENCOUNTER — Encounter: Payer: Self-pay | Admitting: Internal Medicine

## 2015-01-17 VITALS — BP 122/60 | HR 96 | Temp 97.5°F | Resp 14 | Ht 73.0 in | Wt 182.8 lb

## 2015-01-17 DIAGNOSIS — I10 Essential (primary) hypertension: Secondary | ICD-10-CM

## 2015-01-17 DIAGNOSIS — E785 Hyperlipidemia, unspecified: Secondary | ICD-10-CM

## 2015-01-17 DIAGNOSIS — Z23 Encounter for immunization: Secondary | ICD-10-CM | POA: Diagnosis not present

## 2015-01-17 DIAGNOSIS — Z7189 Other specified counseling: Secondary | ICD-10-CM | POA: Diagnosis not present

## 2015-01-17 DIAGNOSIS — Z7184 Encounter for health counseling related to travel: Secondary | ICD-10-CM | POA: Insufficient documentation

## 2015-01-17 DIAGNOSIS — J449 Chronic obstructive pulmonary disease, unspecified: Secondary | ICD-10-CM

## 2015-01-17 MED ORDER — PROMETHAZINE HCL 12.5 MG PO TABS: 12.5000 mg | ORAL_TABLET | Freq: Three times a day (TID) | ORAL | Status: DC | PRN

## 2015-01-17 MED ORDER — DOXYCYCLINE HYCLATE 100 MG PO CAPS: 100.0000 mg | ORAL_CAPSULE | Freq: Two times a day (BID) | ORAL | Status: DC

## 2015-01-17 MED ORDER — LEVOFLOXACIN 500 MG PO TABS: 500.0000 mg | ORAL_TABLET | Freq: Every day | ORAL | Status: DC

## 2015-01-17 MED ORDER — PREDNISONE 10 MG PO TABS: ORAL_TABLET | ORAL | Status: DC

## 2015-01-17 NOTE — Patient Instructions (Addendum)
Take doxycycline  Twice daily  for 7 days long with the prednisone taper for your current mild COPD exaceration.  Please take a probiotic ( Align, Floraque or Culturelle) while you are on the antibiotic to prevent a serious antibiotic associated diarrhea  Called clostirudium dificile colitis and a vaginal yeast infection   For your trip:   Take the levaquin for 7 days if you develop sinusitis,  Diarrhea lasting > 1 day,  And for 14 days if you develop a  urinary tract infection    Take the promethazine for nausea.  Use Immodium  For diarrhea  Call us back with the names of the inhalers so we can make sure you are stocked for your trip

## 2015-01-17 NOTE — Progress Notes (Signed)
Subjective:  Patient ID: Terry [name], male    DOB: 12-Dec-1933  Age: 81 y.o. MRN: [id number]  CC: The primary encounter diagnosis was Travel advice encounter. Diagnoses of Encounter for immunization, Hyperlipidemia with target LDL less than 70, Essential hypertension, COPD, moderate ([place]), and Bronchitis with chronic airway obstruction ([place]) were also pertinent to this visit.  HPI Terry [name] presents for follow up on COPD secondary to history of tobacco abuse.  He states that his symptoms are stable .  He is using his 3 inhalers as directed. However, he has been having a productive cough for several weeks .  Sputum has been green at times.  Not having sinus congestion.  Short of breath with exertion but not wearing O2. At work .  Feels worn out when he returns home. He is going to [place]  On Dec 23 until Jan 19th For a cruise.  He has flown within the past year without supplemental oxygen and did not have any issues. .    Outpatient Prescriptions Prior to Visit  Medication Sig Dispense Refill  . albuterol-ipratropium (COMBIVENT) 18-103 MCG/ACT inhaler Inhale 2 puffs into the lungs every 6 (six) hours as needed for wheezing or shortness of breath. 14.7 g 2  . b complex vitamins tablet Take 1 tablet by mouth daily.    . benazepril (LOTENSIN) 40 MG tablet TAKE 1 TABLET (40 MG TOTAL) BY MOUTH DAILY. 90 tablet 1  . benazepril (LOTENSIN) 40 MG tablet TAKE 1 TABLET (40 MG TOTAL) BY MOUTH DAILY. 90 tablet 1  . Cholecalciferol (VITAMIN D3) 1000 UNITS CAPS Take by mouth.    Marland Kitchen CINNAMON PO Take by mouth.    . cyanocobalamin 1000 MCG tablet Take 1,000 mcg by mouth daily.    Marland Kitchen glipiZIDE (GLUCOTROL XL) 2.5 MG 24 hr tablet TAKE 1 TABLET BY MOUTH EVERY DAY 90 tablet 2  . methimazole (TAPAZOLE) 5 MG tablet Take 2.5 mg by mouth daily.    . metoprolol succinate (TOPROL-XL) 50 MG 24 hr tablet Take 1 tablet (50 mg total) by mouth daily. 90 tablet 1  . omeprazole (PRILOSEC) 40 MG capsule TAKE ONE  CAPSULE BY MOUTH EVERY DAY 30 capsule 3  . simvastatin (ZOCOR) 40 MG tablet TAKE 1 TABLET BY MOUTH AT BEDTIME 30 tablet 5  . tamsulosin (FLOMAX) 0.4 MG CAPS capsule TAKE 1 CAPSULE (0.4 MG TOTAL) BY MOUTH DAILY. 30 capsule 3  . enalapril (VASOTEC) 20 MG tablet Take 20 mg by mouth daily. Reported on 01/17/2015     No facility-administered medications prior to visit.    Review of Systems;  Patient denies headache, fevers, malaise, unintentional weight loss, skin rash, eye pain, sinus congestion and sinus pain, sore throat, dysphagia,  hemoptysis, chest pain, palpitations, orthopnea, edema, abdominal pain, nausea, melena, diarrhea, constipation, flank pain, dysuria, hematuria, urinary  Frequency, nocturia, numbness, tingling, seizures,  Focal weakness, Loss of consciousness,  Tremor, insomnia, depression, anxiety, and suicidal ideation.      Objective:  BP 122/60 mmHg  Pulse 96  Temp(Src) 97.5 F (36.4 C) (Oral)  Resp 14  Ht 6\' 1"  (1.854 m)  Wt 182 lb 12 oz (82.895 kg)  BMI 24.12 kg/m2  SpO2 90%  BP Readings from Last 3 Encounters:  01/17/15 122/60  07/05/14 110/62  03/23/14 120/72    Wt Readings from Last 3 Encounters:  01/17/15 182 lb 12 oz (82.895 kg)  07/05/14 181 lb 12.8 oz (82.464 kg)  03/23/14 180 lb (81.647 kg)  General appearance: alert, cooperative and appears stated age Ears: normal TM's and external ear canals both ears Throat: lips, mucosa, and tongue normal; teeth and gums normal Neck: no adenopathy, no carotid bruit, supple, symmetrical, trachea midline and thyroid not enlarged, symmetric, no tenderness/mass/nodules Back: symmetric, no curvature. ROM normal. No CVA tenderness. Lungs: bilateral ronchi, fair air movement,  Occasional wheezes,  bilaterally Heart: regular rate and rhythm, S1, S2 normal, no murmur, click, rub or gallop Abdomen: soft, non-tender; bowel sounds normal; no masses,  no organomegaly Pulses: 2+ and symmetric Skin: Skin color, texture,  turgor normal. No rashes or lesions Lymph nodes: Cervical, supraclavicular, and axillary nodes normal.  Lab Results  Component Value Date   HGBA1C 6.6 11/05/2014   HGBA1C 6.9* 04/28/2014   HGBA1C 7.4* 11/12/2013    Lab Results  Component Value Date   CREATININE 1.52* 07/05/2014   CREATININE 1.2 04/28/2014   CREATININE 1.1 05/08/2013    Lab Results  Component Value Date   WBC 8.1 12/17/2012   HGB 9.2* 12/17/2012   HCT 28.0* 12/17/2012   PLT 280 12/17/2012   GLUCOSE 96 07/05/2014   CHOL 161 07/05/2014   TRIG 164.0* 07/05/2014   HDL 64.20 07/05/2014   LDLDIRECT 81.5 10/02/2012   LDLCALC 64 07/05/2014   ALT 11 07/05/2014   AST 16 07/05/2014   NA 137 07/05/2014   K 4.8 07/05/2014   CL 105 07/05/2014   CREATININE 1.52* 07/05/2014   BUN 27* 07/05/2014   CO2 26 07/05/2014   TSH 1.45 04/28/2014   PSA 0.75 11/12/2013   INR 0.9 12/12/2012   HGBA1C 6.6 11/05/2014   MICROALBUR 1.5 11/12/2013    No results found.  Assessment & Plan:   Problem List Items Addressed This Visit    Hyperlipidemia with target LDL less than 76    Well controlled on current statin therapy.   Liver enzymes normal  Lab Results  Component Value Date   CHOL 161 07/05/2014   HDL 64.20 07/05/2014   LDLCALC 64 07/05/2014   LDLDIRECT 81.5 10/02/2012   TRIG 164.0* 07/05/2014   CHOLHDL 3 07/05/2014   Lab Results  Component Value Date   ALT 11 07/05/2014   AST 16 07/05/2014   ALKPHOS 62 07/05/2014   BILITOT 0.7 07/05/2014             Hypertension    Well controlled on current regimen. Renal function stable, no changes today.      COPD, moderate ([place])    Patient has resumed use of Advair and Spiriva daily but not use of supplemental 02,  Which he is not interested in doing at his time. He has a  Science writer to [place] in the near future for a vacation cruise, but does not want to bring 02 on board  He flew to [place] last year without supplemental 02 .         Relevant Medications    promethazine (PHENERGAN) 12.5 MG tablet   predniSONE (DELTASONE) 10 MG tablet   Travel advice encounter - Primary    Patient was counseled on use of antibiotics if needed during cruise, as well as how to manage and episodes of gastroenteritis      Bronchitis with chronic airway obstruction (H[facility])    Treating with doxycycline and prednisone taper.       Relevant Medications   promethazine (PHENERGAN) 12.5 MG tablet   predniSONE (DELTASONE) 10 MG tablet    Other Visit Diagnoses    Encounter for immunization  I am having Mr. Wooley start on levofloxacin, promethazine, and doxycycline. I am also having him maintain his methimazole, Vitamin D3, CINNAMON PO, cyanocobalamin, b complex vitamins, glipiZIDE, enalapril, benazepril, simvastatin, metoprolol succinate, omeprazole, tamsulosin, benazepril, albuterol-ipratropium, and predniSONE.  Meds ordered this encounter  Medications  . levofloxacin (LEVAQUIN) 500 MG tablet    Sig: Take 1 tablet (500 mg total) by mouth daily.    Dispense:  14 tablet    Refill:  0  . DISCONTD: predniSONE (DELTASONE) 10 MG tablet    Sig: 6 tablets on Day 1 , then reduce by 1 tablet daily until gone    Dispense:  21 tablet    Refill:  0  . promethazine (PHENERGAN) 12.5 MG tablet    Sig: Take 1 tablet (12.5 mg total) by mouth every 8 (eight) hours as needed for nausea or vomiting.    Dispense:  20 tablet    Refill:  0  . doxycycline (VIBRAMYCIN) 100 MG capsule    Sig: Take 1 capsule (100 mg total) by mouth 2 (two) times daily.    Dispense:  14 capsule    Refill:  0  . predniSONE (DELTASONE) 10 MG tablet    Sig: 6 tablets on Day 1 , then reduce by 1 tablet daily until gone    Dispense:  21 tablet    Refill:  0    Pharmacy:  This is not a duplication error;  Please fill twice , two separate bottles    Medications Discontinued During This Encounter  Medication Reason  . predniSONE (DELTASONE) 10 MG tablet Reorder    Follow-up: Return in about 6  weeks (around 02/28/2015).   [name], MD

## 2015-01-17 NOTE — Progress Notes (Signed)
Pre-visit discussion using our clinic review tool. No additional management support is needed unless otherwise documented below in the visit note.  

## 2015-01-19 ENCOUNTER — Encounter: Payer: Self-pay | Admitting: Internal Medicine

## 2015-01-19 DIAGNOSIS — J449 Chronic obstructive pulmonary disease, unspecified: Secondary | ICD-10-CM | POA: Insufficient documentation

## 2015-01-19 NOTE — Assessment & Plan Note (Signed)
Well controlled on current regimen. Renal function stable, no changes today. 

## 2015-01-19 NOTE — Assessment & Plan Note (Signed)
Patient has resumed use of Advair and Spiriva daily but not use of supplemental 02,  Which he is not interested in doing at his time. He has a  Science writer to [place] in the near future for a vacation cruise, but does not want to bring 02 on board  He flew to [place] last year without supplemental 02 .

## 2015-01-19 NOTE — Assessment & Plan Note (Signed)
Patient was counseled on use of antibiotics if needed during cruise, as well as how to manage and episodes of gastroenteritis

## 2015-01-19 NOTE — Assessment & Plan Note (Signed)
Treating with doxycycline and prednisone taper.

## 2015-01-19 NOTE — Assessment & Plan Note (Addendum)
Well controlled on current statin therapy.   Liver enzymes normal  Lab Results  Component Value Date   CHOL 161 07/05/2014   HDL 64.20 07/05/2014   LDLCALC 64 07/05/2014   LDLDIRECT 81.5 10/02/2012   TRIG 164.0* 07/05/2014   CHOLHDL 3 07/05/2014   Lab Results  Component Value Date   ALT 11 07/05/2014   AST 16 07/05/2014   ALKPHOS 62 07/05/2014   BILITOT 0.7 07/05/2014

## 2015-01-26 ENCOUNTER — Encounter: Payer: Self-pay | Admitting: *Deleted

## 2015-03-01 ENCOUNTER — Ambulatory Visit (INDEPENDENT_AMBULATORY_CARE_PROVIDER_SITE_OTHER): Payer: PPO | Admitting: Internal Medicine

## 2015-03-01 ENCOUNTER — Encounter: Payer: Self-pay | Admitting: Internal Medicine

## 2015-03-01 VITALS — BP 128/70 | HR 78 | Temp 97.6°F | Resp 12 | Ht 73.0 in | Wt 183.0 lb

## 2015-03-01 DIAGNOSIS — I208 Other forms of angina pectoris: Secondary | ICD-10-CM | POA: Insufficient documentation

## 2015-03-01 DIAGNOSIS — J449 Chronic obstructive pulmonary disease, unspecified: Secondary | ICD-10-CM | POA: Diagnosis not present

## 2015-03-01 NOTE — Patient Instructions (Addendum)
Cost effective Combinations for COPD:  combivent  (up to 4 times daily) plus advair (2 times daily)  OR  SPIRIVA ONCE DAILY' ADVAIR TWICE Daily  (has steroid and long acting albuterol in it) Albuterol  (ALSO CALLED ProAir or Ventolin)  as needed  Resuming Advair should reduce the coughing episodes,  Make sure you rinse mouth after using to avoid developing thrush  I am referring you to Dr Fletcher Anon for stress testing

## 2015-03-01 NOTE — Progress Notes (Signed)
, not using 0 Subjective:  Patient ID: Terry [name], male    DOB: [birth year]  Age: 82 y.o. MRN: [id number]  CC: The primary encounter diagnosis was Anginal equivalent ([place]). A diagnosis of COPD, moderate ([place]) was also pertinent to this visit.  HPI Terry [name] presents for follow up on hyperlipidemia, htn and DM . Last see in dec for travel advice,  treated for mild COPD exacerbation and went on a 3 week cruise to [place] which was apparently a debacle due to delays by customs followed by a return to port due to critically ill patient.   COPD: ambulatoy 02 sats 88%  94 with rest .  Still coughing but nonproductive. using Spiriva and combivent prn  No adviar  Working part time at [employer]. Not using 02.     DM Type 2   Managed by Dr [name]  hgba1c was  6.6   ED:  Using the medication has resulted in recurrent diziness.    Discussed nuc med study [place].  Outpatient Prescriptions Prior to Visit  Medication Sig Dispense Refill  . albuterol-ipratropium (COMBIVENT) 18-103 MCG/ACT inhaler Inhale 2 puffs into the lungs every 6 (six) hours as needed for wheezing or shortness of breath. 14.7 g 2  . b complex vitamins tablet Take 1 tablet by mouth daily.    . benazepril (LOTENSIN) 40 MG tablet TAKE 1 TABLET (40 MG TOTAL) BY MOUTH DAILY. 90 tablet 1  . benazepril (LOTENSIN) 40 MG tablet TAKE 1 TABLET (40 MG TOTAL) BY MOUTH DAILY. 90 tablet 1  . Cholecalciferol (VITAMIN D3) 1000 UNITS CAPS Take by mouth.    Marland Kitchen CINNAMON PO Take by mouth.    . cyanocobalamin 1000 MCG tablet Take 1,000 mcg by mouth daily.    . enalapril (VASOTEC) 20 MG tablet Take 20 mg by mouth daily. Reported on 01/17/2015    . glipiZIDE (GLUCOTROL XL) 2.5 MG 24 hr tablet TAKE 1 TABLET BY MOUTH EVERY DAY 90 tablet 2  . methimazole (TAPAZOLE) 5 MG tablet Take 2.5 mg by mouth daily.    . metoprolol succinate (TOPROL-XL) 50 MG 24 hr tablet Take 1 tablet (50 mg total) by mouth daily. 90 tablet 1  . omeprazole (PRILOSEC) 40 MG  capsule TAKE ONE CAPSULE BY MOUTH EVERY DAY 30 capsule 3  . simvastatin (ZOCOR) 40 MG tablet TAKE 1 TABLET BY MOUTH AT BEDTIME 30 tablet 5  . tamsulosin (FLOMAX) 0.4 MG CAPS capsule TAKE 1 CAPSULE (0.4 MG TOTAL) BY MOUTH DAILY. 30 capsule 3  . doxycycline (VIBRAMYCIN) 100 MG capsule Take 1 capsule (100 mg total) by mouth 2 (two) times daily. 14 capsule 0  . levofloxacin (LEVAQUIN) 500 MG tablet Take 1 tablet (500 mg total) by mouth daily. 14 tablet 0  . predniSONE (DELTASONE) 10 MG tablet 6 tablets on Day 1 , then reduce by 1 tablet daily until gone 21 tablet 0  . promethazine (PHENERGAN) 12.5 MG tablet Take 1 tablet (12.5 mg total) by mouth every 8 (eight) hours as needed for nausea or vomiting. 20 tablet 0   No facility-administered medications prior to visit.    Review of Systems;  Patient denies headache, fevers, malaise, unintentional weight loss, skin rash, eye pain, sinus congestion and sinus pain, sore throat, dysphagia,  hemoptysis , cough, dyspnea, wheezing, chest pain, palpitations, orthopnea, edema, abdominal pain, nausea, melena, diarrhea, constipation, flank pain, dysuria, hematuria, urinary  Frequency, nocturia, numbness, tingling, seizures,  Focal weakness, Loss of consciousness,  Tremor, insomnia, depression, anxiety,  and suicidal ideation.      Objective:  BP 128/70 mmHg  Pulse 78  Temp(Src) 97.6 F (36.4 C) (Oral)  Resp 12  Ht 6\' 1"  (1.854 m)  Wt 183 lb (83.008 kg)  BMI 24.15 kg/m2  SpO2 94%  BP Readings from Last 3 Encounters:  03/01/15 128/70  01/17/15 122/60  07/05/14 110/62    Wt Readings from Last 3 Encounters:  03/01/15 183 lb (83.008 kg)  01/17/15 182 lb 12 oz (82.895 kg)  07/05/14 181 lb 12.8 oz (82.464 kg)    General appearance: alert, cooperative and appears stated age Ears: normal TM's and external ear canals both ears Throat: lips, mucosa, and tongue normal; teeth and gums normal Neck: no adenopathy, no carotid bruit, supple, symmetrical,  trachea midline and thyroid not enlarged, symmetric, no tenderness/mass/nodules Back: symmetric, no curvature. ROM normal. No CVA tenderness. Lungs: clear to auscultation bilaterally Heart: regular rate and rhythm, S1, S2 normal, no murmur, click, rub or gallop Abdomen: soft, non-tender; bowel sounds normal; no masses,  no organomegaly Pulses: 2+ and symmetric Skin: Skin color, texture, turgor normal. No rashes or lesions Lymph nodes: Cervical, supraclavicular, and axillary nodes normal.  Lab Results  Component Value Date   HGBA1C 6.6 11/05/2014   HGBA1C 6.9* 04/28/2014   HGBA1C 7.4* 11/12/2013    Lab Results  Component Value Date   CREATININE 1.52* 07/05/2014   CREATININE 1.2 04/28/2014   CREATININE 1.1 05/08/2013    Lab Results  Component Value Date   WBC 8.1 12/17/2012   HGB 9.2* 12/17/2012   HCT 28.0* 12/17/2012   PLT 280 12/17/2012   GLUCOSE 96 07/05/2014   CHOL 161 07/05/2014   TRIG 164.0* 07/05/2014   HDL 64.20 07/05/2014   LDLDIRECT 81.5 10/02/2012   LDLCALC 64 07/05/2014   ALT 11 07/05/2014   AST 16 07/05/2014   NA 137 07/05/2014   K 4.8 07/05/2014   CL 105 07/05/2014   CREATININE 1.52* 07/05/2014   BUN 27* 07/05/2014   CO2 26 07/05/2014   TSH 1.45 04/28/2014   PSA 0.75 11/12/2013   INR 0.9 12/12/2012   HGBA1C 6.6 11/05/2014   MICROALBUR 1.5 11/12/2013    No results found.  Assessment & Plan:   Problem List Items Addressed This Visit    COPD, moderate ([place])    Not well controlled due to medication regimen.  Advised to resume Advair and combivent.       Relevant Medications   COMBIVENT RESPIMAT 20-100 MCG/ACT AERS respimat   Tiotropium Bromide Monohydrate (SPIRIVA RESPIMAT IN)   Anginal equivalent (HCC) - Primary    His episodes of dizziness during sex and during use of Viagra are concerning for ischemia given long history of tobacco abuse.  Referring to cardiology for stress testing      Relevant Orders   Ambulatory referral to Cardiology       I have discontinued Mr. Trompeter's levofloxacin, promethazine, doxycycline, and predniSONE. I am also having him maintain his methimazole, Vitamin D3, CINNAMON PO, cyanocobalamin, b complex vitamins, glipiZIDE, enalapril, benazepril, simvastatin, metoprolol succinate, omeprazole, tamsulosin, benazepril, albuterol-ipratropium, COMBIVENT RESPIMAT, and Tiotropium Bromide Monohydrate (SPIRIVA RESPIMAT IN).  Meds ordered this encounter  Medications  . COMBIVENT RESPIMAT 20-100 MCG/ACT AERS respimat    Sig: Use as directed 1 puff in the mouth or throat 4 (four) times daily as needed.    Refill:  2  . Tiotropium Bromide Monohydrate (SPIRIVA RESPIMAT IN)    Sig: Inhale 1 spray into the lungs daily.  Medications Discontinued During This Encounter  Medication Reason  . doxycycline (VIBRAMYCIN) 100 MG capsule Completed Course  . promethazine (PHENERGAN) 12.5 MG tablet Error  . predniSONE (DELTASONE) 10 MG tablet Completed Course  . levofloxacin (LEVAQUIN) 500 MG tablet Completed Course    Follow-up: Return in about 6 months (around 08/29/2015).   [name], MD

## 2015-03-02 NOTE — Assessment & Plan Note (Signed)
His episodes of dizziness during sex and during use of Viagra are concerning for ischemia given long history of tobacco abuse.  Referring to cardiology for stress testing

## 2015-03-02 NOTE — Assessment & Plan Note (Signed)
Not well controlled due to medication regimen.  Advised to resume Advair and combivent.

## 2015-03-12 ENCOUNTER — Other Ambulatory Visit: Payer: Self-pay | Admitting: Internal Medicine

## 2015-04-05 ENCOUNTER — Other Ambulatory Visit: Payer: Self-pay | Admitting: Internal Medicine

## 2015-04-28 ENCOUNTER — Other Ambulatory Visit: Payer: Self-pay | Admitting: Internal Medicine

## 2015-05-02 ENCOUNTER — Ambulatory Visit (INDEPENDENT_AMBULATORY_CARE_PROVIDER_SITE_OTHER): Payer: PPO | Admitting: Cardiovascular Disease

## 2015-05-02 ENCOUNTER — Encounter: Payer: Self-pay | Admitting: Cardiovascular Disease

## 2015-05-02 VITALS — BP 110/56 | HR 94 | Ht 73.0 in | Wt 185.5 lb

## 2015-05-02 DIAGNOSIS — R0989 Other specified symptoms and signs involving the circulatory and respiratory systems: Secondary | ICD-10-CM | POA: Diagnosis not present

## 2015-05-02 DIAGNOSIS — I208 Other forms of angina pectoris: Secondary | ICD-10-CM

## 2015-05-02 DIAGNOSIS — I10 Essential (primary) hypertension: Secondary | ICD-10-CM | POA: Diagnosis not present

## 2015-05-02 DIAGNOSIS — R0602 Shortness of breath: Secondary | ICD-10-CM | POA: Diagnosis not present

## 2015-05-02 MED ORDER — ASPIRIN EC 81 MG PO TBEC: 81.0000 mg | DELAYED_RELEASE_TABLET | Freq: Every day | ORAL | Status: AC

## 2015-05-02 NOTE — Progress Notes (Signed)
Cardiology Office Note   Date:  05/02/2015   ID:  Terry [name], DOB 1933-07-04, MRN [id number]  PCP:  [name], MD  Cardiologist:   Kathlyn Sacramento, MD   Chief Complaint  Patient presents with  . other    Ref by Dr. [name] for evaluation for a stress test. Meds reviewed by the patient verbally. Pt. c/o shortness of breath with over exertion.       History of Present Illness: Terry [name] is a 82 y.o. male who Was referred by Dr. [name] for evaluation of exertional dyspnea. He has no previous cardiac history. He has multiple chronic medical conditions that include COPD, previous tobacco use, hypertension, type 2 diabetes and hyperlipidemia. He quit smoking about 15 years ago but he did smoke heavily for at least 50 years. He has no family history of premature coronary artery disease. He has experienced progressive symptoms of exertional dyspnea with no chest discomfort, orthopnea, PND or leg edema. He also reports feeling restless and dizzy when he takes Viagra. He has not experienced substernal chest tightness. He has no prior history of stroke.    Past Medical History  Diagnosis Date  . Diabetes mellitus   . hyperthyroidism   . Hyperlipidemia   . Hypertension   . Anemia   . 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    Past Surgical History  Procedure Laterality Date  . Cataract extraction, bilateral  2012    [facility]  . Colonoscopy w/ polypectomy  2011    Dr. Raina Mina  . Appendectomy  1970  . Colonoscopy  12-10-12     Current Outpatient Prescriptions  Medication Sig Dispense Refill  . albuterol-ipratropium (COMBIVENT) 18-103 MCG/ACT inhaler Inhale 2 puffs into the lungs every 6 (six) hours as needed for wheezing or shortness of breath. 14.7 g 2  . b complex vitamins tablet Take 1 tablet by mouth daily.    . benazepril (LOTENSIN) 40 MG tablet TAKE 1 TABLET (40 MG TOTAL) BY  MOUTH DAILY. 90 tablet 1  . Cholecalciferol (VITAMIN D3) 1000 UNITS CAPS Take by mouth.    Marland Kitchen CINNAMON PO Take by mouth.    . COMBIVENT RESPIMAT 20-100 MCG/ACT AERS respimat Use as directed 1 puff in the mouth or throat 4 (four) times daily as needed.  2  . cyanocobalamin 1000 MCG tablet Take 1,000 mcg by mouth daily.    . enalapril (VASOTEC) 20 MG tablet Take 20 mg by mouth daily. Reported on 01/17/2015    . glipiZIDE (GLUCOTROL XL) 2.5 MG 24 hr tablet TAKE 1 TABLET BY MOUTH EVERY DAY 90 tablet 2  . methimazole (TAPAZOLE) 5 MG tablet Take 2.5 mg by mouth daily.    . metoprolol succinate (TOPROL-XL) 50 MG 24 hr tablet TAKE 1 TABLET (50 MG TOTAL) BY MOUTH DAILY. 90 tablet 2  . omeprazole (PRILOSEC) 40 MG capsule TAKE ONE CAPSULE BY MOUTH EVERY DAY 30 capsule 3  . simvastatin (ZOCOR) 40 MG tablet TAKE 1 TABLET BY MOUTH AT BEDTIME 30 tablet 5  . tamsulosin (FLOMAX) 0.4 MG CAPS capsule TAKE 1 CAPSULE (0.4 MG TOTAL) BY MOUTH DAILY. 30 capsule 3  . Tiotropium Bromide Monohydrate (SPIRIVA RESPIMAT IN) Inhale 1 spray into the lungs daily.    Marland Kitchen aspirin EC 81 MG tablet Take 1 tablet (81 mg total) by mouth daily. 90 tablet 3   No current facility-administered medications for this visit.  Allergies:   Review of patient's allergies indicates no known allergies.    Social History:  The patient  reports that he quit smoking about 15 years ago. His smoking use included Cigarettes. He has a 100 pack-year smoking history. He has never used smokeless tobacco. He reports that he drinks about 15.0 oz of alcohol per week. He reports that he does not use illicit drugs.   Family History:  The patient's family history includes Diabetes in his maternal aunt; Hyperlipidemia in his maternal uncle; Hypertension in his father. There is no history of Birth defects.    ROS:  Please see the history of present illness.   Otherwise, review of systems are positive for none.   All other systems are reviewed and negative.     PHYSICAL EXAM: VS:  BP 110/56 mmHg  Pulse 94  Ht 6\' 1"  (1.854 m)  Wt 185 lb 8 oz (84.142 kg)  BMI 24.48 kg/m2 , BMI Body mass index is 24.48 kg/(m^2). GEN: Well nourished, well developed, in no acute distress HEENT: normal Neck: no JVD,  or masses. Bilateral carotid bruits noted louder on the right side Cardiac: RRR; no  rubs, or gallops,no edema . There is one out of 6 systolic ejection murmur in the aortic area Respiratory:  clear to auscultation bilaterally, normal work of breathing GI: soft, nontender, nondistended, + BS MS: no deformity or atrophy Skin: warm and dry, no rash Neuro:  Strength and sensation are intact Psych: euthymic mood, full affect   EKG:  EKG is ordered today. The ekg ordered today demonstrates normal sinus rhythm with left axis deviation   Recent Labs: 07/05/2014: ALT 11; BUN 27*; Creatinine, Ser 1.52*; Potassium 4.8; Sodium 137    Lipid Panel    Component Value Date/Time   CHOL 161 07/05/2014 1504   TRIG 164.0* 07/05/2014 1504   HDL 64.20 07/05/2014 1504   CHOLHDL 3 07/05/2014 1504   VLDL 32.8 07/05/2014 1504   LDLCALC 64 07/05/2014 1504   LDLDIRECT 81.5 10/02/2012 1203      Wt Readings from Last 3 Encounters:  05/02/15 185 lb 8 oz (84.142 kg)  03/01/15 183 lb (83.008 kg)  01/17/15 182 lb 12 oz (82.895 kg)        ASSESSMENT AND PLAN:  1.  Exertional dyspnea and possible angina equivalent: The patient has multiple risk factors for coronary artery disease. His baseline ECG is slightly abnormal. I recommend further ischemic cardiac evaluation with a pharmacologic nuclear stress test. The patient is not able to exercise on a treadmill. Recommend continuing aggressive treatment of risk factors.  2. Bilateral carotid bruits: Louder on the right side. I requested carotid Doppler. I initiated aspirin 81 mg once daily.  3. Essential hypertension: The pressure is well controlled on current medications.  4. Hyperlipidemia: Continue treatment  with simvastatin 40 mg once daily. Most recent LDL was 64 which is optimal.    Disposition:   FU with me  as needed if testing is abnormal.  Signed,  Kathlyn Sacramento, MD  05/02/2015 6:50 PM    [place]

## 2015-05-02 NOTE — Patient Instructions (Addendum)
Medication Instructions:  Your physician has recommended you make the following change in your medication:  START taking aspirin 81mg  daily   Labwork: none  Testing/Procedures: Your physician has requested that you have a carotid duplex. This test is an ultrasound of the carotid arteries in your neck. It looks at blood flow through these arteries that supply the brain with blood. Allow one hour for this exam. There are no restrictions or special instructions.   Your physician has requested that you have a lexiscan myoview. For further information please visit [email or web address]. Please follow instruction sheet, as given.  [place]  Your caregiver has ordered a Stress Test with nuclear imaging. The purpose of this test is to evaluate the blood supply to your heart muscle. This procedure is referred to as a "Non-Invasive Stress Test." This is because other than having an IV started in your vein, nothing is inserted or "invades" your body. Cardiac stress tests are done to find areas of poor blood flow to the heart by determining the extent of coronary artery disease (CAD). Some patients exercise on a treadmill, which naturally increases the blood flow to your heart, while others who are  unable to walk on a treadmill due to physical limitations have a pharmacologic/chemical stress agent called Lexiscan . This medicine will mimic walking on a treadmill by temporarily increasing your coronary blood flow.   Please note: these test may take anywhere between 2-4 hours to complete  PLEASE REPORT TO [facility] AT THE FIRST DESK WILL DIRECT YOU WHERE TO GO  Date of Procedure: Monday,  April 17  Arrival Time for Procedure:    8:15am  Instructions regarding medication:   ___xx_ : Hold diabetes medication morning of procedure  _xx___:  Hold metoprolol the night before procedure and morning of procedure   PLEASE NOTIFY THE OFFICE AT LEAST 24 HOURS IN ADVANCE IF  YOU ARE UNABLE TO KEEP YOUR APPOINTMENT.  [phone] AND  PLEASE NOTIFY NUCLEAR MEDICINE AT [facility] AT LEAST 24 HOURS IN ADVANCE IF YOU ARE UNABLE TO KEEP YOUR APPOINTMENT. [phone]  How to prepare for your Myoview test:   Do not eat or drink after midnight  No caffeine for 24 hours prior to test  No smoking 24 hours prior to test.  Your medication may be taken with water.  If your doctor stopped a medication because of this test, do not take that medication.  Ladies, please do not wear dresses.  Skirts or pants are appropriate. Please wear a short sleeve shirt.  No perfume, cologne or lotion.  Wear comfortable walking shoes. No heels!            Follow-Up: Your physician recommends that you schedule a follow-up appointment as needed.    Any Other Special Instructions Will Be Listed Below (If Applicable).     If you need a refill on your cardiac medications before your next appointment, please call your pharmacy.  Cardiac Nuclear Scanning A cardiac nuclear scan is used to check your heart for problems, such as the following:  A portion of the heart is not getting enough blood.  Part of the heart muscle has died, which happens with a heart attack.  The heart wall is not working normally.  In this test, a radioactive dye (tracer) is injected into your bloodstream. After the tracer has traveled to your heart, a scanning device is used to measure how much of the tracer is absorbed by or distributed to various  areas of your heart. LET [facility] CARE PROVIDER KNOW ABOUT:  Any allergies you have.  All medicines you are taking, including vitamins, herbs, eye drops, creams, and over-the-counter medicines.  Previous problems you or members of your family have had with the use of anesthetics.  Any blood disorders you have.  Previous surgeries you have had.  Medical conditions you have.  RISKS AND COMPLICATIONS Generally, this is a safe procedure. However, as  with any procedure, problems can occur. Possible problems include:   Serious chest pain.  Rapid heartbeat.  Sensation of warmth in your chest. This usually passes quickly. BEFORE THE PROCEDURE Ask your health care provider about changing or stopping your regular medicines. PROCEDURE This procedure is usually done at a hospital and takes 2-4 hours.  An IV tube is inserted into one of your veins.  Your health care provider will inject a small amount of radioactive tracer through the tube.  You will then wait for 20-40 minutes while the tracer travels through your bloodstream.  You will lie down on an exam table so images of your heart can be taken. Images will be taken for about 15-20 minutes.  You will exercise on a treadmill or stationary bike. While you exercise, your heart activity will be monitored with an electrocardiogram (ECG), and your blood pressure will be checked.  If you are unable to exercise, you may be given a medicine to make your heart beat faster.  When blood flow to your heart has peaked, tracer will again be injected through the IV tube.  After 20-40 minutes, you will get back on the exam table and have more images taken of your heart.  When the procedure is over, your IV tube will be removed. AFTER THE PROCEDURE  You will likely be able to leave shortly after the test. Unless your health care provider tells you otherwise, you may return to your normal schedule, including diet, activities, and medicines.  Make sure you find out how and when you will get your test results.   This information is not intended to replace advice given to you by your health care provider. Make sure you discuss any questions you have with your health care provider.   Document Released: 02/10/2004 Document Revised: 01/20/2013 Document Reviewed: 12/24/2012 Elsevier Interactive Patient Education [employer].

## 2015-05-03 DIAGNOSIS — E119 Type 2 diabetes mellitus without complications: Secondary | ICD-10-CM | POA: Diagnosis not present

## 2015-05-03 DIAGNOSIS — E0590 Thyrotoxicosis, unspecified without thyrotoxic crisis or storm: Secondary | ICD-10-CM | POA: Diagnosis not present

## 2015-05-10 DIAGNOSIS — E0590 Thyrotoxicosis, unspecified without thyrotoxic crisis or storm: Secondary | ICD-10-CM | POA: Diagnosis not present

## 2015-05-10 DIAGNOSIS — E538 Deficiency of other specified B group vitamins: Secondary | ICD-10-CM | POA: Diagnosis not present

## 2015-05-10 DIAGNOSIS — E119 Type 2 diabetes mellitus without complications: Secondary | ICD-10-CM | POA: Diagnosis not present

## 2015-05-12 ENCOUNTER — Ambulatory Visit: Payer: Medicare Other | Admitting: General Surgery

## 2015-05-13 ENCOUNTER — Telehealth: Payer: Self-pay | Admitting: Cardiovascular Disease

## 2015-05-13 NOTE — Telephone Encounter (Signed)
S/w pt wife, [place], (on [place]). Pt is at work and she asks I review lexi myoview instructions with her. Reviewed information with wife who verbalized understanding. Wife had no further questions.

## 2015-05-16 ENCOUNTER — Encounter
Admission: RE | Admit: 2015-05-16 | Discharge: 2015-05-16 | Disposition: A | Discharge status: Home / Self Care | Payer: PPO | Source: Ambulatory Visit | Attending: Cardiovascular Disease | Admitting: Cardiovascular Disease

## 2015-05-16 DIAGNOSIS — R0602 Shortness of breath: Secondary | ICD-10-CM

## 2015-05-16 LAB — NM MYOCAR MULTI W/SPECT W/WALL MOTION / EF
CHL CUP RESTING HR STRESS: 67 {beats}/min
CHL CUP STRESS STAGE 1 GRADE: 0 %
CHL CUP STRESS STAGE 1 SPEED: 0 mph
CHL CUP STRESS STAGE 2 GRADE: 0 %
CHL CUP STRESS STAGE 2 SPEED: 0 mph
CHL CUP STRESS STAGE 3 GRADE: 0 %
CHL CUP STRESS STAGE 4 DBP: 72 mmHg
CHL CUP STRESS STAGE 4 GRADE: 0 %
CHL CUP STRESS STAGE 4 SPEED: 0 mph
CHL CUP STRESS STAGE 5 DBP: 72 mmHg
CHL CUP STRESS STAGE 5 GRADE: 0 %
CHL CUP STRESS STAGE 5 SPEED: 0 mph
CSEPEW: 1 METS
LV dias vol: 99 mL (ref 62–150)
LVSYSVOL: 58 mL
NUC STRESS TID: 1.06
Peak HR: 95 {beats}/min
Percent HR: 70 %
Percent of predicted max HR: 67 %
SDS: 2
SRS: 3
SSS: 6
Stage 1 HR: 74 {beats}/min
Stage 2 HR: 74 {beats}/min
Stage 3 HR: 95 {beats}/min
Stage 3 Speed: 0 mph
Stage 4 HR: 88 {beats}/min
Stage 4 SBP: 175 mmHg
Stage 5 HR: 79 {beats}/min
Stage 5 SBP: 175 mmHg

## 2015-05-16 MED ORDER — REGADENOSON 0.4 MG/5ML IV SOLN
0.4000 mg | Freq: Once | INTRAVENOUS | Status: AC
  Administered 2015-05-16: 0.4 mg via INTRAVENOUS

## 2015-05-16 MED ORDER — TECHNETIUM TC 99M SESTAMIBI - CARDIOLITE
12.1500 | Freq: Once | INTRAVENOUS | Status: AC | PRN
  Administered 2015-05-16: 09:00:00 12.15 via INTRAVENOUS

## 2015-05-16 MED ORDER — TECHNETIUM TC 99M SESTAMIBI - CARDIOLITE
30.6600 | Freq: Once | INTRAVENOUS | Status: AC | PRN
  Administered 2015-05-16: 30.66 via INTRAVENOUS

## 2015-05-19 ENCOUNTER — Ambulatory Visit: Payer: PPO

## 2015-05-19 DIAGNOSIS — R0989 Other specified symptoms and signs involving the circulatory and respiratory systems: Secondary | ICD-10-CM | POA: Diagnosis not present

## 2015-05-24 ENCOUNTER — Telehealth: Payer: Self-pay | Admitting: Cardiovascular Disease

## 2015-05-24 ENCOUNTER — Other Ambulatory Visit: Payer: Self-pay

## 2015-05-24 DIAGNOSIS — I6523 Occlusion and stenosis of bilateral carotid arteries: Secondary | ICD-10-CM

## 2015-05-24 NOTE — Telephone Encounter (Signed)
Stress test results did not come to my inbasket for some reasons. It was mildly abnormal. I recommend that he follows up with me to discuss results of stress test and carotid Doppler.   Reviewed results and recommendation w/pt who is agreeable w/plan. Confirmed May 2, 3:30pm appt.

## 2015-05-25 ENCOUNTER — Telehealth: Payer: Self-pay | Admitting: Cardiovascular Disease

## 2015-05-25 NOTE — Telephone Encounter (Signed)
Patient had carotid on 05/19/15 and there is an order in [facility].  Is this a duplicate order or does patient need a repeat?   Thanks!

## 2015-05-25 NOTE — Telephone Encounter (Signed)
Repeat carotids in one year from April 2017

## 2015-05-31 ENCOUNTER — Ambulatory Visit (INDEPENDENT_AMBULATORY_CARE_PROVIDER_SITE_OTHER): Payer: PPO | Admitting: Cardiovascular Disease

## 2015-05-31 ENCOUNTER — Encounter: Payer: Self-pay | Admitting: Cardiovascular Disease

## 2015-05-31 VITALS — BP 92/46 | HR 84 | Ht 73.0 in | Wt 182.4 lb

## 2015-05-31 DIAGNOSIS — I10 Essential (primary) hypertension: Secondary | ICD-10-CM | POA: Diagnosis not present

## 2015-05-31 DIAGNOSIS — I6523 Occlusion and stenosis of bilateral carotid arteries: Secondary | ICD-10-CM

## 2015-05-31 DIAGNOSIS — I208 Other forms of angina pectoris: Secondary | ICD-10-CM

## 2015-05-31 NOTE — Progress Notes (Signed)
Cardiology Office Note   Date:  05/31/2015   ID:  Terry [name], DOB 12-Feb-1933, MRN [id number]  PCP:  [name], MD  Cardiologist:   Kathlyn Sacramento, MD   Chief Complaint  Patient presents with  . Follow-up    NO CP, SOB OR SWELLING. NO OTHER COMPLAINTS      History of Present Illness: Terry [name] is a 82 y.o. male who Is here today for a follow-up visit regarding exertional dyspnea and mildly abnormal stress test. He has no previous cardiac history. He has multiple chronic medical conditions that include COPD, previous tobacco use, hypertension, type 2 diabetes and hyperlipidemia. He quit smoking about 15 years ago but he did smoke heavily for at least 50 years. He has no family history of premature coronary artery disease.  He was seen recently for mild exertional dyspnea with no chest pain. He underwent a pharmacologic nuclear stress test which showed mild distal anterior wall ischemia. The study overall was suboptimal due to intense GI uptake. He was found to have bilateral carotid bruits. Carotid Doppler showed 40-59% bilateral carotid stenosis. No previous history of stroke.   Past Medical History  Diagnosis Date  . Diabetes mellitus   . hyperthyroidism   . Hyperlipidemia   . Hypertension   . Anemia   . 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    Past Surgical History  Procedure Laterality Date  . Cataract extraction, bilateral  2012    Porfilio  . Colonoscopy w/ polypectomy  2011    Dr. Raina Mina  . Appendectomy  1970  . Colonoscopy  12-10-12     Current Outpatient Prescriptions  Medication Sig Dispense Refill  . albuterol-ipratropium (COMBIVENT) 18-103 MCG/ACT inhaler Inhale 2 puffs into the lungs every 6 (six) hours as needed for wheezing or shortness of breath. 14.7 g 2  . aspirin EC 81 MG tablet Take 1 tablet (81 mg total) by mouth daily. 90 tablet 3  . b complex  vitamins tablet Take 1 tablet by mouth daily.    . benazepril (LOTENSIN) 40 MG tablet TAKE 1 TABLET (40 MG TOTAL) BY MOUTH DAILY. 90 tablet 1  . Cholecalciferol (VITAMIN D3) 1000 UNITS CAPS Take by mouth.    Marland Kitchen CINNAMON PO Take by mouth.    . COMBIVENT RESPIMAT 20-100 MCG/ACT AERS respimat Use as directed 1 puff in the mouth or throat 4 (four) times daily as needed.  2  . cyanocobalamin 1000 MCG tablet Take 1,000 mcg by mouth daily.    . enalapril (VASOTEC) 20 MG tablet Take 20 mg by mouth daily. Reported on 01/17/2015    . glipiZIDE (GLUCOTROL XL) 2.5 MG 24 hr tablet TAKE 1 TABLET BY MOUTH EVERY DAY 90 tablet 2  . metFORMIN (GLUCOPHAGE-XR) 500 MG 24 hr tablet Take 500 mg by mouth daily.    . methimazole (TAPAZOLE) 5 MG tablet Take 2.5 mg by mouth daily.    . metoprolol succinate (TOPROL-XL) 50 MG 24 hr tablet TAKE 1 TABLET (50 MG TOTAL) BY MOUTH DAILY. 90 tablet 2  . omeprazole (PRILOSEC) 40 MG capsule TAKE ONE CAPSULE BY MOUTH EVERY DAY 30 capsule 3  . simvastatin (ZOCOR) 40 MG tablet TAKE 1 TABLET BY MOUTH AT BEDTIME 30 tablet 5  . tamsulosin (FLOMAX) 0.4 MG CAPS capsule TAKE 1 CAPSULE (0.4 MG TOTAL) BY MOUTH DAILY. 30 capsule 3  . Tiotropium Bromide Monohydrate (SPIRIVA RESPIMAT  IN) Inhale 1 spray into the lungs daily.     No current facility-administered medications for this visit.    Allergies:   Review of patient's allergies indicates no known allergies.    Social History:  The patient  reports that he quit smoking about 15 years ago. His smoking use included Cigarettes. He has a 100 pack-year smoking history. He has never used smokeless tobacco. He reports that he drinks about 15.0 oz of alcohol per week. He reports that he does not use illicit drugs.   Family History:  The patient's family history includes Diabetes in his maternal aunt; Hyperlipidemia in his maternal uncle; Hypertension in his father. There is no history of Birth defects.    ROS:  Please see the history of present  illness.   Otherwise, review of systems are positive for none.   All other systems are reviewed and negative.    PHYSICAL EXAM: VS:  BP 92/46 mmHg  Pulse 84  Ht 6\' 1"  (1.854 m)  Wt 182 lb 6.4 oz (82.736 kg)  BMI 24.07 kg/m2 , BMI Body mass index is 24.07 kg/(m^2). GEN: Well nourished, well developed, in no acute distress HEENT: normal Neck: no JVD,  or masses. Bilateral carotid bruits noted louder on the right side Cardiac: RRR; no  rubs, or gallops,no edema . There is one out of 6 systolic ejection murmur in the aortic area Respiratory:  clear to auscultation bilaterally, normal work of breathing GI: soft, nontender, nondistended, + BS MS: no deformity or atrophy Skin: warm and dry, no rash Neuro:  Strength and sensation are intact Psych: euthymic mood, full affect   EKG:  EKG is not ordered today.    Recent Labs: 07/05/2014: ALT 11; BUN 27*; Creatinine, Ser 1.52*; Potassium 4.8; Sodium 137    Lipid Panel    Component Value Date/Time   CHOL 161 07/05/2014 1504   TRIG 164.0* 07/05/2014 1504   HDL 64.20 07/05/2014 1504   CHOLHDL 3 07/05/2014 1504   VLDL 32.8 07/05/2014 1504   LDLCALC 64 07/05/2014 1504   LDLDIRECT 81.5 10/02/2012 1203      Wt Readings from Last 3 Encounters:  05/31/15 182 lb 6.4 oz (82.736 kg)  05/02/15 185 lb 8 oz (84.142 kg)  03/01/15 183 lb (83.008 kg)        ASSESSMENT AND PLAN:  1.  Exertional dyspnea and possible angina equivalent:  Stress test was mildly abnormal with possible mild distal anterior wall ischemia. Ejection fraction was normal. The study was still low risk and overall was suboptimal due to intense GI uptake. Given that the area affected was relatively small, I think we do have the option of treating him medically. He reports that his exertional dyspnea has been stable with no recent worsening and currently is not lifestyle limiting.   I did discuss with him the option of proceeding with cardiac catheterization but given that his  symptoms are mild, will continue medical therapy for now. He is already on optimal medical therapy.  2. Bilateral  carotid stenosis:  This was moderate on carotid Doppler. Continue low-dose aspirin and treatment of risk factors. I requested a follow-up study in one year.  3. Essential hypertension: The pressure is well controlled on current medications.  4. Hyperlipidemia: Continue treatment with simvastatin 40 mg once daily. Most recent LDL was 64 which is optimal.    Disposition:   FU with me  in 6 months.  Signed,  Kathlyn Sacramento, MD  05/31/2015 4:09 PM  [facility]

## 2015-05-31 NOTE — Patient Instructions (Signed)
Medication Instructions: Continue same medications.   Labwork: None.   Procedures/Testing: None.   Follow-Up: 6 months with Dr. Erynne Kealey.   Any Additional Special Instructions Will Be Listed Below (If Applicable).     If you need a refill on your cardiac medications before your next appointment, please call your pharmacy.   

## 2015-06-02 ENCOUNTER — Ambulatory Visit: Payer: PPO | Admitting: Cardiovascular Disease

## 2015-06-27 ENCOUNTER — Other Ambulatory Visit: Payer: Self-pay | Admitting: Internal Medicine

## 2015-07-06 ENCOUNTER — Encounter: Payer: Self-pay | Admitting: *Deleted

## 2015-08-11 ENCOUNTER — Encounter: Payer: Self-pay | Admitting: General Surgery

## 2015-08-11 ENCOUNTER — Ambulatory Visit (INDEPENDENT_AMBULATORY_CARE_PROVIDER_SITE_OTHER): Payer: PPO | Admitting: General Surgery

## 2015-08-11 VITALS — BP 142/62 | HR 70 | Resp 14 | Ht 72.0 in | Wt 181.0 lb

## 2015-08-11 DIAGNOSIS — L989 Disorder of the skin and subcutaneous tissue, unspecified: Secondary | ICD-10-CM

## 2015-08-11 NOTE — Patient Instructions (Signed)
Patient to see the desmologist.

## 2015-08-11 NOTE — Progress Notes (Signed)
Patient ID: Terry [name], male   DOB: Nov 06, 1933, 82 y.o.   MRN: [id number]  Chief Complaint  Patient presents with  . Other    right arm lesion    HPI Terry [name] is a 82 y.o. male here today for a evaluation of a right arm lesion. He states he noticed this area about six to eight months ago. He states no chance in size but a little red.   I personally reviewed the patient's history. HPI  Past Medical History  Diagnosis Date  . Diabetes mellitus   . hyperthyroidism   . Hyperlipidemia   . Hypertension   . Anemia   . 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    Past Surgical History  Procedure Laterality Date  . Cataract extraction, bilateral  2012    Porfilio  . Colonoscopy w/ polypectomy  2011    Dr. Raina Mina  . Appendectomy  1970  . Colonoscopy  12-10-12  . Arm skin lesion biopsy / excision Left 2016    Family History  Problem Relation Age of Onset  . Diabetes Maternal Aunt   . Hyperlipidemia Maternal Uncle   . Birth defects Neg Hx   . Hypertension Father     Social History Social History  Substance Use Topics  . Smoking status: Former Smoker -- 2.00 packs/day for 50 years    Types: Cigarettes    Quit date: 06/26/1999  . Smokeless tobacco: Never Used  . Alcohol Use: 15.0 oz/week    25 Glasses of wine per week    No Known Allergies  Current Outpatient Prescriptions  Medication Sig Dispense Refill  . albuterol-ipratropium (COMBIVENT) 18-103 MCG/ACT inhaler Inhale 2 puffs into the lungs every 6 (six) hours as needed for wheezing or shortness of breath. 14.7 g 2  . aspirin EC 81 MG tablet Take 1 tablet (81 mg total) by mouth daily. 90 tablet 3  . b complex vitamins tablet Take 1 tablet by mouth daily.    . benazepril (LOTENSIN) 40 MG tablet TAKE 1 TABLET (40 MG TOTAL) BY MOUTH DAILY. 90 tablet 1  . Cholecalciferol (VITAMIN D3) 1000 UNITS CAPS Take by mouth.    . COMBIVENT  RESPIMAT 20-100 MCG/ACT AERS respimat Use as directed 1 puff in the mouth or throat 4 (four) times daily as needed.  2  . cyanocobalamin 1000 MCG tablet Take 1,000 mcg by mouth daily.    . enalapril (VASOTEC) 20 MG tablet Take 20 mg by mouth daily. Reported on 01/17/2015    . glipiZIDE (GLUCOTROL XL) 2.5 MG 24 hr tablet TAKE 1 TABLET BY MOUTH EVERY DAY 90 tablet 2  . metFORMIN (GLUCOPHAGE-XR) 500 MG 24 hr tablet Take 500 mg by mouth daily.    . methimazole (TAPAZOLE) 5 MG tablet Take 2.5 mg by mouth daily.    . metoprolol succinate (TOPROL-XL) 50 MG 24 hr tablet TAKE 1 TABLET (50 MG TOTAL) BY MOUTH DAILY. 90 tablet 2  . omeprazole (PRILOSEC) 40 MG capsule TAKE ONE CAPSULE BY MOUTH EVERY DAY 30 capsule 3  . simvastatin (ZOCOR) 40 MG tablet TAKE 1 TABLET BY MOUTH AT BEDTIME 30 tablet 5  . tamsulosin (FLOMAX) 0.4 MG CAPS capsule TAKE 1 CAPSULE (0.4 MG TOTAL) BY MOUTH DAILY. 30 capsule 3  . Tiotropium Bromide Monohydrate (SPIRIVA RESPIMAT IN) Inhale 1 spray into the lungs daily.     No current facility-administered medications for this visit.  Review of Systems Review of Systems  Blood pressure 142/62, pulse 70, resp. rate 14, height 6' (1.829 m), weight 181 lb (82.101 kg).  Physical Exam Physical Exam  Musculoskeletal:       Arms:  Laboratory review  12/16/2013 pathology from left upper arm:  PATH REPORT.FINAL DX SPEC Comment   Comments   Diagnosis:  MASS, UPPER ARM, LEFT:  - WELL-DIFFERENTIATED SQUAMOUS CELL CARCINOMA, KERATOACANTHOMA  TYPE.  - THE MARGINS OF EXCISION ARE NEGATIVE.           Assessment    Multiple cutaneous lesions suspicious for seborrheic keratosis.    Plan    Considering the multitude of lesions, I think he would be best managed by dermatology evaluation and likely liquid nitrogen application.  The patient's wife is a patient of Dr. Kellie Moor, and he is encourage to contact her office for assessment.      Patient to return  as needed.  PCP:   Deborra Medina  This information has been scribed by Gaspar Cola CMA.    Robert Bellow 08/11/2015, 9:02 PM

## 2015-08-24 ENCOUNTER — Encounter: Payer: Self-pay | Admitting: Internal Medicine

## 2015-08-24 ENCOUNTER — Other Ambulatory Visit: Payer: Self-pay | Admitting: Internal Medicine

## 2015-08-30 ENCOUNTER — Ambulatory Visit: Payer: PPO | Admitting: Internal Medicine

## 2015-11-01 ENCOUNTER — Other Ambulatory Visit: Payer: Self-pay | Admitting: Internal Medicine

## 2015-11-11 LAB — HEMOGLOBIN A1C: HEMOGLOBIN A1C: 6.7

## 2015-11-23 DIAGNOSIS — E119 Type 2 diabetes mellitus without complications: Secondary | ICD-10-CM | POA: Diagnosis not present

## 2015-11-23 DIAGNOSIS — E538 Deficiency of other specified B group vitamins: Secondary | ICD-10-CM | POA: Diagnosis not present

## 2015-11-23 DIAGNOSIS — E0590 Thyrotoxicosis, unspecified without thyrotoxic crisis or storm: Secondary | ICD-10-CM | POA: Diagnosis not present

## 2015-11-24 DIAGNOSIS — E119 Type 2 diabetes mellitus without complications: Secondary | ICD-10-CM | POA: Diagnosis not present

## 2015-11-24 DIAGNOSIS — E0590 Thyrotoxicosis, unspecified without thyrotoxic crisis or storm: Secondary | ICD-10-CM | POA: Diagnosis not present

## 2015-11-24 DIAGNOSIS — Z87898 Personal history of other specified conditions: Secondary | ICD-10-CM | POA: Diagnosis not present

## 2015-11-24 DIAGNOSIS — E538 Deficiency of other specified B group vitamins: Secondary | ICD-10-CM | POA: Diagnosis not present

## 2015-11-25 ENCOUNTER — Ambulatory Visit (INDEPENDENT_AMBULATORY_CARE_PROVIDER_SITE_OTHER): Payer: PPO | Admitting: Internal Medicine

## 2015-11-25 ENCOUNTER — Encounter: Payer: Self-pay | Admitting: Internal Medicine

## 2015-11-25 VITALS — BP 156/86 | HR 78 | Temp 97.4°F | Ht 72.0 in | Wt 179.2 lb

## 2015-11-25 DIAGNOSIS — J449 Chronic obstructive pulmonary disease, unspecified: Secondary | ICD-10-CM | POA: Diagnosis not present

## 2015-11-25 DIAGNOSIS — E559 Vitamin D deficiency, unspecified: Secondary | ICD-10-CM

## 2015-11-25 DIAGNOSIS — E875 Hyperkalemia: Secondary | ICD-10-CM | POA: Diagnosis not present

## 2015-11-25 DIAGNOSIS — I208 Other forms of angina pectoris: Secondary | ICD-10-CM

## 2015-11-25 DIAGNOSIS — E782 Mixed hyperlipidemia: Secondary | ICD-10-CM | POA: Diagnosis not present

## 2015-11-25 DIAGNOSIS — I10 Essential (primary) hypertension: Secondary | ICD-10-CM

## 2015-11-25 DIAGNOSIS — R338 Other retention of urine: Secondary | ICD-10-CM

## 2015-11-25 DIAGNOSIS — E119 Type 2 diabetes mellitus without complications: Secondary | ICD-10-CM

## 2015-11-25 DIAGNOSIS — E0590 Thyrotoxicosis, unspecified without thyrotoxic crisis or storm: Secondary | ICD-10-CM

## 2015-11-25 DIAGNOSIS — N401 Enlarged prostate with lower urinary tract symptoms: Secondary | ICD-10-CM

## 2015-11-25 DIAGNOSIS — R634 Abnormal weight loss: Secondary | ICD-10-CM

## 2015-11-25 DIAGNOSIS — I6523 Occlusion and stenosis of bilateral carotid arteries: Secondary | ICD-10-CM

## 2015-11-25 LAB — BASIC METABOLIC PANEL
BUN: 16 mg/dL (ref 7–25)
CHLORIDE: 103 mmol/L (ref 98–110)
CO2: 25 mmol/L (ref 20–31)
Calcium: 9.7 mg/dL (ref 8.6–10.3)
Creat: 1.28 mg/dL — ABNORMAL HIGH (ref 0.70–1.11)
Glucose, Bld: 99 mg/dL (ref 65–99)
POTASSIUM: 4.8 mmol/L (ref 3.5–5.3)
Sodium: 138 mmol/L (ref 135–146)

## 2015-11-25 LAB — LIPID PANEL
CHOL/HDL RATIO: 1.9 ratio (ref <=5.0)
CHOLESTEROL: 165 mg/dL (ref 125–200)
HDL: 87 mg/dL (ref >=40)
LDL Cholesterol: 49 mg/dL (ref <130)
Triglycerides: 143 mg/dL (ref <150)
VLDL: 29 mg/dL (ref <30)

## 2015-11-25 LAB — LDL CHOLESTEROL, DIRECT: Direct LDL: 66 mg/dL (ref <130)

## 2015-11-25 MED ORDER — BENAZEPRIL HCL 40 MG PO TABS: ORAL_TABLET | ORAL | 1 refills | Status: DC

## 2015-11-25 NOTE — Progress Notes (Signed)
Pre visit review using our clinic review tool, if applicable. No additional management support is needed unless otherwise documented below in the visit note. 

## 2015-11-25 NOTE — Progress Notes (Signed)
Subjective:  Patient ID: Terry [name], male    DOB: [birth year]  Age: 82 y.o. MRN: [id number]  CC: The primary encounter diagnosis was Mixed hyperlipidemia. Diagnoses of Hyperkalemia, Vitamin D deficiency, COPD, moderate ([place]), Bilateral carotid artery stenosis, Anginal equivalent ([place]), Enlarged prostate with urinary retention, Hyperthyroidism, Diabetes mellitus type 2, noninsulin dependent ([place]), Essential hypertension, and Loss of weight were also pertinent to this visit.  HPI Terry [name] presents for follow up on chronic conditions incluing COPD, CAD and hypertension  Patient is taking isdications as prescribed and notes no adverse effects.  Home BP readings have been done about once per week and are  generally < 130/80 .  he is avoiding added salt in her diet and walks the dog briefly at night for exercise.  In the interim  Since his last visit . he was evaluated by Dr. Fletcher Anon for exertional dyspnea.  Workup was noninvasive and suggested non critical  CAD and  PAD. Medical management advised.Marland Kitchen    He continues to work part time but is tired from Working 36 hours weekly at [employer] . He is not using oxygen.   Fasting today     Outpatient Medications Prior to Visit  Medication Sig Dispense Refill  . aspirin EC 81 MG tablet Take 1 tablet (81 mg total) by mouth daily. 90 tablet 3  . b complex vitamins tablet Take 1 tablet by mouth daily.    . Cholecalciferol (VITAMIN D3) 1000 UNITS CAPS Take by mouth.    . COMBIVENT RESPIMAT 20-100 MCG/ACT AERS respimat Use as directed 1 puff in the mouth or throat 4 (four) times daily as needed.  2  . cyanocobalamin 1000 MCG tablet Take 1,000 mcg by mouth daily.    Marland Kitchen glipiZIDE (GLUCOTROL XL) 2.5 MG 24 hr tablet TAKE 1 TABLET BY MOUTH EVERY DAY 90 tablet 2  . metFORMIN (GLUCOPHAGE-XR) 500 MG 24 hr tablet Take 500 mg by mouth daily.    . methimazole (TAPAZOLE) 5 MG tablet Take 2.5 mg by mouth daily.    . metoprolol succinate (TOPROL-XL) 50 MG 24 hr  tablet TAKE 1 TABLET (50 MG TOTAL) BY MOUTH DAILY. 90 tablet 2  . omeprazole (PRILOSEC) 40 MG capsule TAKE ONE CAPSULE BY MOUTH EVERY DAY 30 capsule 0  . simvastatin (ZOCOR) 40 MG tablet TAKE 1 TABLET BY MOUTH AT BEDTIME 30 tablet 0  . tamsulosin (FLOMAX) 0.4 MG CAPS capsule TAKE 1 CAPSULE (0.4 MG TOTAL) BY MOUTH DAILY. 30 capsule 0  . Tiotropium Bromide Monohydrate (SPIRIVA RESPIMAT IN) Inhale 1 spray into the lungs daily.    . benazepril (LOTENSIN) 40 MG tablet TAKE 1 TABLET (40 MG TOTAL) BY MOUTH DAILY. 90 tablet 1  . enalapril (VASOTEC) 20 MG tablet Take 20 mg by mouth daily. Reported on 01/17/2015    . albuterol-ipratropium (COMBIVENT) 18-103 MCG/ACT inhaler Inhale 2 puffs into the lungs every 6 (six) hours as needed for wheezing or shortness of breath. 14.7 g 2   No facility-administered medications prior to visit.     Review of Systems;  Patient denies headache, fevers, malaise, unintentional weight loss, skin rash, eye pain, sinus congestion and sinus pain, sore throat, dysphagia,  hemoptysis , cough, dyspnea, wheezing, chest pain, palpitations, orthopnea, edema, abdominal pain, nausea, melena, diarrhea, constipation, flank pain, dysuria, hematuria, urinary  Frequency, nocturia, numbness, tingling, seizures,  Focal weakness, Loss of consciousness,  Tremor, insomnia, depression, anxiety, and suicidal ideation.      Objective:  BP (!) 156/86  Pulse 78   Temp 97.4 F (36.3 C) (Oral)   Ht 6' (1.829 m)   Wt 179 lb 3.2 oz (81.3 kg)   SpO2 93%   BMI 24.30 kg/m   BP Readings from Last 3 Encounters:  11/25/15 (!) 156/86  08/11/15 (!) 142/62  05/31/15 (!) 92/46    Wt Readings from Last 3 Encounters:  11/25/15 179 lb 3.2 oz (81.3 kg)  08/11/15 181 lb (82.1 kg)  05/31/15 182 lb 6.4 oz (82.7 kg)    General appearance: alert, cooperative and appears stated age Ears: normal TM's and external ear canals both ears Throat: lips, mucosa, and tongue normal; teeth and gums  normal Neck: no adenopathy, no carotid bruit, supple, symmetrical, trachea midline and thyroid not enlarged, symmetric, no tenderness/mass/nodules Back: symmetric, no curvature. ROM normal. No CVA tenderness. Lungs: clear to auscultation bilaterally Heart: regular rate and rhythm, S1, S2 normal, no murmur, click, rub or gallop Abdomen: soft, non-tender; bowel sounds normal; no masses,  no organomegaly Pulses: 2+ and symmetric Skin: Skin color, texture, turgor normal. No rashes or lesions Lymph nodes: Cervical, supraclavicular, and axillary nodes normal.  Lab Results  Component Value Date   HGBA1C 6.6 11/05/2014   HGBA1C 6.9 (A) 04/28/2014   HGBA1C 7.4 (H) 11/12/2013    Lab Results  Component Value Date   CREATININE 1.28 (H) 11/25/2015   CREATININE 1.52 (H) 07/05/2014   CREATININE 1.2 04/28/2014    Lab Results  Component Value Date   WBC 8.1 12/17/2012   HGB 9.2 (L) 12/17/2012   HCT 28.0 (L) 12/17/2012   PLT 280 12/17/2012   GLUCOSE 99 11/25/2015   CHOL 165 11/25/2015   TRIG 143 11/25/2015   HDL 87 11/25/2015   LDLDIRECT 66 11/25/2015   LDLCALC 49 11/25/2015   ALT 11 07/05/2014   AST 16 07/05/2014   NA 138 11/25/2015   K 4.8 11/25/2015   CL 103 11/25/2015   CREATININE 1.28 (H) 11/25/2015   BUN 16 11/25/2015   CO2 25 11/25/2015   TSH 1.45 04/28/2014   PSA 0.75 11/12/2013   INR 0.9 12/12/2012   HGBA1C 6.6 11/05/2014   MICROALBUR 1.5 11/12/2013    Nm Myocar Multi W/spect W/wall Motion / Ef  Result Date: 05/16/2015  There was no ST segment deviation noted during stress.  Defect 1: There is a small defect of mild severity present in the apical anterior and apex location.  No T wave inversion was noted during stress.  Findings consistent with mild ischemia in distal LAD distribution. .  This is a low risk study.  The left ventricular ejection fraction is normal (55-65%).  Poor study due to intense GI uptake. Correlate clinically.     Assessment & Plan:    Problem List Items Addressed This Visit    Hypertension    Managed with ACE inhibitor and beta blocker .  His BP is elevated today bc he has not taken his benazepril.  BMET was repeated due to elevated potassium on Dr Sammuel Hines labs  (patient was apparently not called about this). Repeat potassium is normal.   Lab Results  Component Value Date   NA 138 11/25/2015   K 4.8 11/25/2015   CL 103 11/25/2015   CO2 25 11/25/2015         Relevant Medications   benazepril (LOTENSIN) 40 MG tablet   Hyperthyroidism    Managed with methimazole by Endicrinology.      Diabetes mellitus type 2, noninsulin dependent ([place])  Well controlled currently,  Managed by Endocrinology.       Relevant Medications   benazepril (LOTENSIN) 40 MG tablet   COPD, moderate ([facility])    Managed with LAMA (spiriva)  And short acting beta agonist. He is no longer using Adviar or Dulera due to cost.        Loss of weight    Persistent, mild.  Given his history of tobacco abuse,  Will address and recommend low dose CT lungs.       Enlarged prostate with urinary retention    Managed with Glomax.       Anginal equivalent ([facility])    His episodes of dizziness during sex and during use of Viagra are concerning for ischemia given long history of tobacco abuse.  Has has had a noninvasive cardiac evaluation and has deferred invasive testing.  Medical Management advised.  Continue metoprolol atorvastatin, asa, and ACE inhibitor      Relevant Medications   benazepril (LOTENSIN) 40 MG tablet   Bilateral carotid artery stenosis    Non critical.  Taking asa and lipitor.  LDL at goal   Lab Results  Component Value Date   CHOL 165 11/25/2015   HDL 87 11/25/2015   LDLCALC 49 11/25/2015   LDLDIRECT 66 11/25/2015   TRIG 143 11/25/2015   CHOLHDL 1.9 11/25/2015         Relevant Medications   benazepril (LOTENSIN) 40 MG tablet    Other Visit Diagnoses    Mixed hyperlipidemia    -  Primary   Relevant Medications    benazepril (LOTENSIN) 40 MG tablet   Other Relevant Orders   LDL cholesterol, direct (Completed)   Lipid panel (Completed)   Hyperkalemia       Relevant Orders   Basic metabolic panel (Completed)   Vitamin D deficiency       Relevant Orders   VITAMIN D 25 Hydroxy (Vit-D Deficiency, Fractures) (Completed)     A total of 25 minutes of face to face time was spent with patient more than half of which was spent in counselling about the above mentioned conditions  and coordination of care  I have discontinued Mr. Aeschliman's enalapril and albuterol-ipratropium. I am also having him maintain his methimazole, Vitamin D3, cyanocobalamin, b complex vitamins, glipiZIDE, COMBIVENT RESPIMAT, Tiotropium Bromide Monohydrate (SPIRIVA RESPIMAT IN), metoprolol succinate, aspirin EC, metFORMIN, omeprazole, tamsulosin, simvastatin, and benazepril.  Meds ordered this encounter  Medications  . benazepril (LOTENSIN) 40 MG tablet    Sig: TAKE 1 TABLET (40 MG TOTAL) BY MOUTH DAILY.    Dispense:  90 tablet    Refill:  1    Medications Discontinued During This Encounter  Medication Reason  . albuterol-ipratropium (COMBIVENT) 18-103 MCG/ACT inhaler Duplicate  . benazepril (LOTENSIN) 40 MG tablet Reorder  . enalapril (VASOTEC) 20 MG tablet     Follow-up: No Follow-up on file.   [name], MD

## 2015-11-25 NOTE — Patient Instructions (Addendum)
You should be taking benazepril  40 mg daily for blood pressure ,  But your potassium was high when Dr [name] checked it   Do not pick up the benazepril until you hear from me about your repeat potassium level.

## 2015-11-26 LAB — VITAMIN D 25 HYDROXY (VIT D DEFICIENCY, FRACTURES): Vit D, 25-Hydroxy: 25 ng/mL — ABNORMAL LOW (ref 30–100)

## 2015-11-27 ENCOUNTER — Encounter: Payer: Self-pay | Admitting: Internal Medicine

## 2015-11-27 NOTE — Assessment & Plan Note (Signed)
Well controlled currently,  Managed by Endocrinology.

## 2015-11-27 NOTE — Assessment & Plan Note (Signed)
Persistent, mild.  Given his history of tobacco abuse,  Will address and recommend low dose CT lungs.

## 2015-11-27 NOTE — Assessment & Plan Note (Addendum)
Managed with ACE inhibitor and beta blocker .  His BP is elevated today bc he has not taken his benazepril.  BMET was repeated due to elevated potassium on Dr Sammuel Hines labs  (patient was apparently not called about this). Repeat potassium is normal.   Lab Results  Component Value Date   NA 138 11/25/2015   K 4.8 11/25/2015   CL 103 11/25/2015   CO2 25 11/25/2015

## 2015-11-27 NOTE — Assessment & Plan Note (Signed)
Managed with LAMA (spiriva)  And short acting beta agonist. He is no longer using Adviar or Dulera due to cost.   

## 2015-11-27 NOTE — Assessment & Plan Note (Signed)
Non critical.  Taking asa and lipitor.  LDL at goal   Lab Results  Component Value Date   CHOL 165 11/25/2015   HDL 87 11/25/2015   LDLCALC 49 11/25/2015   LDLDIRECT 66 11/25/2015   TRIG 143 11/25/2015   CHOLHDL 1.9 11/25/2015

## 2015-11-27 NOTE — Assessment & Plan Note (Signed)
Managed with Glomax.

## 2015-11-27 NOTE — Assessment & Plan Note (Signed)
Managed with methimazole by Endicrinology.

## 2015-11-27 NOTE — Assessment & Plan Note (Signed)
His episodes of dizziness during sex and during use of Viagra are concerning for ischemia given long history of tobacco abuse.  Has has had a noninvasive cardiac evaluation and has deferred invasive testing.  Medical Management advised.  Continue metoprolol atorvastatin, asa, and ACE inhibitor

## 2015-11-30 ENCOUNTER — Other Ambulatory Visit: Payer: Self-pay | Admitting: Internal Medicine

## 2015-12-24 ENCOUNTER — Other Ambulatory Visit: Payer: Self-pay | Admitting: Internal Medicine

## 2015-12-28 ENCOUNTER — Other Ambulatory Visit: Payer: Self-pay | Admitting: Pharmacist

## 2015-12-28 NOTE — Patient Outreach (Signed)
Outreach call to Terry [name] regarding his request for follow up from the [facility] Medication Adherence Campaign. Left a HIPAA compliant message on the patient's voicemail.   Harlow Asa, PharmD Clinical Pharmacist [place] [facility] [phone]

## 2016-01-02 ENCOUNTER — Ambulatory Visit (INDEPENDENT_AMBULATORY_CARE_PROVIDER_SITE_OTHER): Payer: PPO | Admitting: Family

## 2016-01-02 ENCOUNTER — Encounter: Payer: Self-pay | Admitting: Family

## 2016-01-02 VITALS — BP 146/68 | HR 92 | Temp 97.6°F | Ht 72.0 in | Wt 183.0 lb

## 2016-01-02 DIAGNOSIS — J209 Acute bronchitis, unspecified: Secondary | ICD-10-CM

## 2016-01-02 MED ORDER — IPRATROPIUM-ALBUTEROL 0.5-2.5 (3) MG/3ML IN SOLN: 3.0000 mL | Freq: Four times a day (QID) | RESPIRATORY_TRACT | Status: DC

## 2016-01-02 MED ORDER — DOXYCYCLINE HYCLATE 100 MG PO TABS: 100.0000 mg | ORAL_TABLET | Freq: Two times a day (BID) | ORAL | 0 refills | Status: DC

## 2016-01-02 NOTE — Progress Notes (Signed)
Pre visit review using our clinic review tool, if applicable. No additional management support is needed unless otherwise documented below in the visit note. 

## 2016-01-02 NOTE — Patient Instructions (Signed)
Use albuterol every 6 hours for first 24 hours to get good medication into the lungs and loosen congestion; after, you may use as needed and eventually stop all together when cough resolves.  Increase intake of clear fluids. Congestion is best treated by hydration, when mucus is wetter, it is thinner, less sticky, and easier to expel from the body, either through coughing up drainage, or by blowing your nose.   Get plenty of rest.   Use saline nasal drops and blow your nose frequently. Run a humidifier at night and elevate the head of the bed. Vicks Vapor rub will help with congestion and cough. Steam showers and sinus massage for congestion.   Use Acetaminophen or Ibuprofen as needed for fever or pain. Avoid second hand smoke. Even the smallest exposure will worsen symptoms.   Over the counter medications you can try include Delsym for cough, a decongestant for congestion, and Mucinex or Robitussin as an expectorant. Be sure to just get the plain Mucinex or Robitussin that just has one medication (Guaifenesen). We don't recommend the combination products. Note, be sure to drink two glasses of water with each dose of Mucinex as the medication will not work well without adequate hydration.   You can also try a teaspoon of honey to see if this will help reduce cough. Throat lozenges can sometimes be beneficial as well.    This illness will typically last 7 - 10 days.   Please follow up with our clinic if you develop a fever greater than 101 F, symptoms worsen, or do not resolve in the next week.

## 2016-01-02 NOTE — Progress Notes (Signed)
Subjective:    Patient ID: Terry [name], male    DOB: March 10, 1933, 82 y.o.   MRN: [id number]  CC: [name] is a 82 y.o. male who presents today for an acute visit.    HPI: CC: productive cough 3 weeks, unchanged. Endorses SOB, 'little' wheezing. Has been using inhalers, ibuprofen with little relief.    H/o COPD; doesn't wear o2        HISTORY:  Past Medical History:  Diagnosis Date  . Anemia   . Diabetes mellitus   . Hyperlipidemia   . Hypertension   . hyperthyroidism   . Squamous cell carcinoma of skin of left upper arm November 2015   Excised to negative margins, associated keratoacanthoma.   Past Surgical History:  Procedure Laterality Date  . APPENDECTOMY  1970  . ARM SKIN LESION BIOPSY / EXCISION Left 2016  . CATARACT EXTRACTION, BILATERAL  2012   [facility]  . COLONOSCOPY  12-10-12  . COLONOSCOPY W/ POLYPECTOMY  2011   Dr. Raina Mina   Family History  Problem Relation Age of Onset  . Diabetes Maternal Aunt   . Hyperlipidemia Maternal Uncle   . Birth defects Neg Hx   . Hypertension Father     Allergies: Patient has no known allergies. Current Outpatient Prescriptions on File Prior to Visit  Medication Sig Dispense Refill  . aspirin EC 81 MG tablet Take 1 tablet (81 mg total) by mouth daily. 90 tablet 3  . b complex vitamins tablet Take 1 tablet by mouth daily.    . benazepril (LOTENSIN) 40 MG tablet TAKE 1 TABLET (40 MG TOTAL) BY MOUTH DAILY. 90 tablet 1  . Cholecalciferol (VITAMIN D3) 1000 UNITS CAPS Take by mouth.    . COMBIVENT RESPIMAT 20-100 MCG/ACT AERS respimat Use as directed 1 puff in the mouth or throat 4 (four) times daily as needed.  2  . cyanocobalamin 1000 MCG tablet Take 1,000 mcg by mouth daily.    Marland Kitchen glipiZIDE (GLUCOTROL XL) 2.5 MG 24 hr tablet TAKE 1 TABLET BY MOUTH EVERY DAY 90 tablet 2  . metFORMIN (GLUCOPHAGE-XR) 500 MG 24 hr tablet Take 500 mg by mouth daily.    . methimazole (TAPAZOLE) 5 MG tablet Take 2.5 mg by mouth daily.      . metoprolol succinate (TOPROL-XL) 50 MG 24 hr tablet TAKE 1 TABLET (50 MG TOTAL) BY MOUTH DAILY. 90 tablet 1  . omeprazole (PRILOSEC) 40 MG capsule TAKE ONE CAPSULE BY MOUTH EVERY DAY 30 capsule 3  . simvastatin (ZOCOR) 40 MG tablet TAKE 1 TABLET BY MOUTH AT BEDTIME 30 tablet 3  . tamsulosin (FLOMAX) 0.4 MG CAPS capsule TAKE 1 CAPSULE (0.4 MG TOTAL) BY MOUTH DAILY. 30 capsule 3  . Tiotropium Bromide Monohydrate (SPIRIVA RESPIMAT IN) Inhale 1 spray into the lungs daily.     No current facility-administered medications on file prior to visit.     Social History  Substance Use Topics  . Smoking status: Former Smoker    Packs/day: 2.00    Years: 50.00    Types: Cigarettes    Quit date: 06/26/1999  . Smokeless tobacco: Never Used  . Alcohol use 15.0 oz/week    25 Glasses of wine per week    Review of Systems  Constitutional: Negative for chills and fever.  HENT: Positive for congestion. Negative for ear pain, facial swelling, sore throat and trouble swallowing.   Respiratory: Positive for cough, shortness of breath and wheezing.   Cardiovascular: Negative for chest pain  and palpitations.  Gastrointestinal: Negative for nausea and vomiting.      Objective:    BP (!) 152/68   Pulse 92   Temp 97.6 F (36.4 C) (Oral)   Ht 6' (1.829 m)   Wt 183 lb (83 kg)   SpO2 93%   BMI 24.82 kg/m    Physical Exam  Constitutional: Vital signs are normal. He appears well-developed and well-nourished.  HENT:  Head: Normocephalic and atraumatic.  Right Ear: Hearing, tympanic membrane, external ear and ear canal normal. No drainage, swelling or tenderness. Tympanic membrane is not injected, not erythematous and not bulging. No middle ear effusion. No decreased hearing is noted.  Left Ear: Hearing, tympanic membrane, external ear and ear canal normal. No drainage, swelling or tenderness. Tympanic membrane is not injected, not erythematous and not bulging.  No middle ear effusion. No decreased  hearing is noted.  Nose: Nose normal. Right sinus exhibits no maxillary sinus tenderness and no frontal sinus tenderness. Left sinus exhibits no maxillary sinus tenderness and no frontal sinus tenderness.  Mouth/Throat: Uvula is midline, oropharynx is clear and moist and mucous membranes are normal. No oropharyngeal exudate, posterior oropharyngeal edema, posterior oropharyngeal erythema or tonsillar abscesses.  Eyes: Conjunctivae are normal.  Cardiovascular: Regular rhythm and normal heart sounds.   Pulmonary/Chest: Effort normal. No respiratory distress. He has decreased breath sounds in the right lower field and the left lower field. He has no wheezes. He has no rhonchi. He has no rales.  Lymphadenopathy:       Head (right side): No submental, no submandibular, no tonsillar, no preauricular, no posterior auricular and no occipital adenopathy present.       Head (left side): No submental, no submandibular, no tonsillar, no preauricular, no posterior auricular and no occipital adenopathy present.    He has no cervical adenopathy.  Neurological: He is alert.  Skin: Skin is warm and dry.  Psychiatric: He has a normal mood and affect. His speech is normal and behavior is normal.  Vitals reviewed.  Patient felt significantly better after albuterol treatment. Lung sounds clear and increased     Assessment & Plan:  1. Acute bronchitis, unspecified organism sao2 93 at baseline, 93. No acute respiratory distress. Due to duration of symptoms and history of COPD, patient and I jointly agreed to start antibiotic. Return precautions given.   - ipratropium-albuterol (DUONEB) 0.5-2.5 (3) MG/3ML nebulizer solution 3 mL; Take 3 mLs by nebulization every 6 (six) hours. - doxycycline (VIBRA-TABS) 100 MG tablet; Take 1 tablet (100 mg total) by mouth 2 (two) times daily.  Dispense: 10 tablet; Refill: 0     I am having Mr. Beaudoin start on doxycycline. I am also having him maintain his methimazole, Vitamin D3,  cyanocobalamin, b complex vitamins, glipiZIDE, COMBIVENT RESPIMAT, Tiotropium Bromide Monohydrate (SPIRIVA RESPIMAT IN), aspirin EC, metFORMIN, benazepril, simvastatin, omeprazole, tamsulosin, and metoprolol succinate. We will continue to administer ipratropium-albuterol.   Meds ordered this encounter  Medications  . ipratropium-albuterol (DUONEB) 0.5-2.5 (3) MG/3ML nebulizer solution 3 mL  . doxycycline (VIBRA-TABS) 100 MG tablet    Sig: Take 1 tablet (100 mg total) by mouth 2 (two) times daily.    Dispense:  10 tablet    Refill:  0    Order Specific Question:   Supervising Provider    Answer:   [name] [[id number]]    Return precautions given.   Risks, benefits, and alternatives of the medications and treatment plan prescribed today were discussed, and patient expressed understanding.  Education regarding symptom management and diagnosis given to patient on AVS.  Continue to follow with TULLO, [name], MD for routine health maintenance.   Terry [name] and I agreed with plan.   Mable Paris, FNP

## 2016-01-11 ENCOUNTER — Other Ambulatory Visit: Payer: Self-pay | Admitting: Family

## 2016-01-11 DIAGNOSIS — J209 Acute bronchitis, unspecified: Secondary | ICD-10-CM

## 2016-01-13 ENCOUNTER — Telehealth: Payer: Self-pay | Admitting: Family

## 2016-01-13 DIAGNOSIS — J209 Acute bronchitis, unspecified: Secondary | ICD-10-CM

## 2016-01-13 MED ORDER — DOXYCYCLINE HYCLATE 100 MG PO TABS: 100.0000 mg | ORAL_TABLET | Freq: Two times a day (BID) | ORAL | 0 refills | Status: DC

## 2016-01-13 NOTE — Telephone Encounter (Signed)
Patient is feeling better still coughing and having a runny nose but patient has not had any issues with O2 levels or temperature.  Patient was informed of placment of medication.

## 2016-01-13 NOTE — Telephone Encounter (Signed)
Call pt-  One more round called in  Please get more information on how he is feeling- any temp? How is sAO2? If sxs worsening, he needs to be seen at urgent care due to h/o COPD. Otherwise, ensure he has a f/u with me and/or PCP to ensure completely resolves

## 2016-02-14 ENCOUNTER — Telehealth: Payer: Self-pay | Admitting: Cardiovascular Disease

## 2016-02-14 NOTE — Telephone Encounter (Signed)
Attempted to schedule fu from recall list.  Patient says she has a cardiologist and doesn't need appt with Arida. Deleting recall.

## 2016-03-12 ENCOUNTER — Encounter: Payer: Self-pay | Admitting: Internal Medicine

## 2016-03-12 ENCOUNTER — Ambulatory Visit (INDEPENDENT_AMBULATORY_CARE_PROVIDER_SITE_OTHER): Payer: PPO | Admitting: Internal Medicine

## 2016-03-12 VITALS — BP 118/68 | HR 88 | Temp 98.2°F | Resp 16 | Wt 181.0 lb

## 2016-03-12 DIAGNOSIS — R1032 Left lower quadrant pain: Secondary | ICD-10-CM

## 2016-03-12 DIAGNOSIS — D649 Anemia, unspecified: Secondary | ICD-10-CM | POA: Diagnosis not present

## 2016-03-12 DIAGNOSIS — E119 Type 2 diabetes mellitus without complications: Secondary | ICD-10-CM | POA: Diagnosis not present

## 2016-03-12 DIAGNOSIS — E785 Hyperlipidemia, unspecified: Secondary | ICD-10-CM

## 2016-03-12 DIAGNOSIS — I6523 Occlusion and stenosis of bilateral carotid arteries: Secondary | ICD-10-CM

## 2016-03-12 DIAGNOSIS — I10 Essential (primary) hypertension: Secondary | ICD-10-CM

## 2016-03-12 DIAGNOSIS — E559 Vitamin D deficiency, unspecified: Secondary | ICD-10-CM | POA: Diagnosis not present

## 2016-03-12 DIAGNOSIS — E0590 Thyrotoxicosis, unspecified without thyrotoxic crisis or storm: Secondary | ICD-10-CM

## 2016-03-12 DIAGNOSIS — R1031 Right lower quadrant pain: Secondary | ICD-10-CM

## 2016-03-12 LAB — MICROALBUMIN / CREATININE URINE RATIO
Creatinine,U: 192.5 mg/dL
Microalb Creat Ratio: 6.3 mg/g (ref 0.0–30.0)
Microalb, Ur: 12.2 mg/dL — ABNORMAL HIGH (ref 0.0–1.9)

## 2016-03-12 NOTE — Progress Notes (Signed)
Pre visit review using our clinic review tool, if applicable. No additional management support is needed unless otherwise documented below in the visit note. 

## 2016-03-12 NOTE — Progress Notes (Signed)
Subjective:  Patient ID: Terry [name], male    DOB: 1933/07/31  Age: 83 y.o. MRN: [id number]  CC: The primary encounter diagnosis was Essential hypertension. Diagnoses of Hyperthyroidism, Hyperlipidemia with target LDL less than 70, Diabetes mellitus type 2, noninsulin dependent ([place]), Anemia, unspecified type, Vitamin D deficiency, Abdominal pain, bilateral lower quadrant, and Bilateral carotid artery stenosis were also pertinent to this visit.  HPI Terry [name] presents for follow up on chronic conditions including hyperlipidemia, hypertension.  COPD and type 2 DM Last a1c was 6.7 [facility] Oct 2017,  June 2016.  Does no check sugars bu states that he is taking his meds as directed. ast eye exam was 2 years ago  And last microalb/cr ratio April 2017 at [place],  Negative.  Last eye exam 2015  By [facility].  Had a several weeks of recurrent lower abdominal pain and nausea  brought on by eating that would last for several hours and would resolve spontaneously . No fvers or hematochezia,  No vomiting .   Did not seek medical attention despite wife's growing concern.  Recalls nothing in particularly different about stools,  But states that his movements often  alternate between hard stools followed by loose stools. Bowels  move nearly daily   The abdominal pain has resolved spontaneously,but he is now eating s eating smaller amounts . Wt is stable  Has diverticulosis by 2015 CT   Foot exam done. Sensation to microfilament is  Spotty. Pulses very weak on right foot,  Palpable on left .  Has bunions  And recent  healed ulcer right great toe medial  side . Denies claudication symptoms .  Still working part time at [employer].  Not wearing oxygen .   Lab Results  Component Value Date   HGBA1C 6.7 11/11/2015   Lab Results  Component Value Date   ALT 11 07/05/2014   AST 16 07/05/2014   ALKPHOS 62 07/05/2014   BILITOT 0.7 07/05/2014      Outpatient Medications Prior to  Visit  Medication Sig Dispense Refill  . aspirin EC 81 MG tablet Take 1 tablet (81 mg total) by mouth daily. 90 tablet 3  . b complex vitamins tablet Take 1 tablet by mouth daily.    . benazepril (LOTENSIN) 40 MG tablet TAKE 1 TABLET (40 MG TOTAL) BY MOUTH DAILY. 90 tablet 1  . Cholecalciferol (VITAMIN D3) 1000 UNITS CAPS Take by mouth.    . COMBIVENT RESPIMAT 20-100 MCG/ACT AERS respimat Use as directed 1 puff in the mouth or throat 4 (four) times daily as needed.  2  . cyanocobalamin 1000 MCG tablet Take 1,000 mcg by mouth daily.    Marland Kitchen glipiZIDE (GLUCOTROL XL) 2.5 MG 24 hr tablet TAKE 1 TABLET BY MOUTH EVERY DAY 90 tablet 2  . metFORMIN (GLUCOPHAGE-XR) 500 MG 24 hr tablet Take 500 mg by mouth daily.    . methimazole (TAPAZOLE) 5 MG tablet Take 2.5 mg by mouth daily.    . metoprolol succinate (TOPROL-XL) 50 MG 24 hr tablet TAKE 1 TABLET (50 MG TOTAL) BY MOUTH DAILY. 90 tablet 1  . omeprazole (PRILOSEC) 40 MG capsule TAKE ONE CAPSULE BY MOUTH EVERY DAY 30 capsule 3  . simvastatin (ZOCOR) 40 MG tablet TAKE 1 TABLET BY MOUTH AT BEDTIME 30 tablet 3  . tamsulosin (FLOMAX) 0.4 MG CAPS capsule TAKE 1 CAPSULE (0.4 MG TOTAL) BY MOUTH DAILY. 30 capsule 3  . Tiotropium Bromide Monohydrate (SPIRIVA RESPIMAT IN) Inhale 1 spray  into the lungs daily.    Marland Kitchen doxycycline (VIBRA-TABS) 100 MG tablet Take 1 tablet (100 mg total) by mouth 2 (two) times daily. 10 tablet 0   Facility-Administered Medications Prior to Visit  Medication Dose Route Frequency Provider Last Rate Last Dose  . ipratropium-albuterol (DUONEB) 0.5-2.5 (3) MG/3ML nebulizer solution 3 mL  3 mL Nebulization Q6H Burnard Hawthorne, FNP        Review of Systems;  Patient denies headache, fevers, malaise, unintentional weight loss, skin rash, eye pain, sinus congestion and sinus pain, sore throat, dysphagia,  hemoptysis , cough, dyspnea, wheezing, chest pain, palpitations, orthopnea, edema, abdominal pain, nausea, melena, diarrhea, constipation,  flank pain, dysuria, hematuria, urinary  Frequency, nocturia, numbness, tingling, seizures,  Focal weakness, Loss of consciousness,  Tremor, insomnia, depression, anxiety, and suicidal ideation.      Objective:  BP 118/68   Pulse 88   Temp 98.2 F (36.8 C) (Oral)   Resp 16   Wt 181 lb (82.1 kg)   SpO2 90%   BMI 24.55 kg/m   BP Readings from Last 3 Encounters:  03/12/16 118/68  01/02/16 (!) 146/68  11/25/15 (!) 156/86    Wt Readings from Last 3 Encounters:  03/12/16 181 lb (82.1 kg)  01/02/16 183 lb (83 kg)  11/25/15 179 lb 3.2 oz (81.3 kg)    General appearance: alert, cooperative and appears stated age Ears: normal TM's and external ear canals both ears Throat: lips, mucosa, and tongue normal; teeth and gums normal Neck: no adenopathy, no carotid bruit, supple, symmetrical, trachea midline and thyroid not enlarged, symmetric, no tenderness/mass/nodules Back: symmetric, no curvature. ROM normal. No CVA tenderness. Lungs: clear to auscultation bilaterally Heart: regular rate and rhythm, S1, S2 normal, no murmur, click, rub or gallop Abdomen: soft, non-tender; bowel sounds normal; no masses,  no organomegaly Pulses: 2+ and symmetric Skin: Skin color, texture, turgor normal. No rashes or lesions Lymph nodes: Cervical, supraclavicular, and axillary nodes normal.  Lab Results  Component Value Date   HGBA1C 6.7 11/11/2015   HGBA1C 6.6 11/05/2014   HGBA1C 6.9 (A) 04/28/2014    Lab Results  Component Value Date   CREATININE 1.28 (H) 11/25/2015   CREATININE 1.52 (H) 07/05/2014   CREATININE 1.2 04/28/2014    Lab Results  Component Value Date   WBC 8.1 12/17/2012   HGB 9.2 (L) 12/17/2012   HCT 28.0 (L) 12/17/2012   PLT 280 12/17/2012   GLUCOSE 99 11/25/2015   CHOL 165 11/25/2015   TRIG 143 11/25/2015   HDL 87 11/25/2015   LDLDIRECT 66 11/25/2015   LDLCALC 49 11/25/2015   ALT 11 07/05/2014   AST 16 07/05/2014   NA 138 11/25/2015   K 4.8 11/25/2015   CL 103  11/25/2015   CREATININE 1.28 (H) 11/25/2015   BUN 16 11/25/2015   CO2 25 11/25/2015   TSH 1.45 04/28/2014   PSA 0.75 11/12/2013   INR 0.9 12/12/2012   HGBA1C 6.7 11/11/2015   MICROALBUR 12.2 (H) 03/12/2016    Nm Myocar Multi W/spect W/wall Motion / Ef  Result Date: 05/16/2015  There was no ST segment deviation noted during stress.  Defect 1: There is a small defect of mild severity present in the apical anterior and apex location.  No T wave inversion was noted during stress.  Findings consistent with mild ischemia in distal LAD distribution. .  This is a low risk study.  The left ventricular ejection fraction is normal (55-65%).  Poor study due to intense  GI uptake. Correlate clinically.     Assessment & Plan:   Problem List Items Addressed This Visit    Abdominal pain, bilateral lower quadrant    Occurring post prandially for 1-2 weeks, Now resolved.  ddx discussed with patient including diverticulitis , chronic mesenteric ischemia. Workup deferred today by patient. Advised to call office for next recurrence so imaging can be obtained.  .       Anemia   Relevant Orders   CBC with Differential/Platelet   Bilateral carotid artery stenosis    Non critical.  Taking asa and lipitor.  LDL at goal   Lab Results  Component Value Date   CHOL 165 11/25/2015   HDL 87 11/25/2015   LDLCALC 49 11/25/2015   LDLDIRECT 66 11/25/2015   TRIG 143 11/25/2015   CHOLHDL 1.9 11/25/2015         Diabetes mellitus type 2, noninsulin dependent ([place])    Well controlled currently,  Managed by Endocrinology. . Last a1c was 6.7 in  october and was ordered today,  He has no proteinuria.  Foot exam notable for decreased pulses right foot.  And spotty sensation       Relevant Orders   Hemoglobin A1c   Microalbumin / creatinine urine ratio (Completed)   Ambulatory referral to Ophthalmology   Ambulatory referral to Podiatry   Hyperlipidemia with target LDL less than 70    Well controlled on  current statin therapy.   Liver enzymes are over due Lab Results  Component Value Date   CHOL 165 11/25/2015   HDL 87 11/25/2015   LDLCALC 49 11/25/2015   LDLDIRECT 66 11/25/2015   TRIG 143 11/25/2015   CHOLHDL 1.9 11/25/2015   Lab Results  Component Value Date   ALT 11 07/05/2014   AST 16 07/05/2014   ALKPHOS 62 07/05/2014   BILITOT 0.7 07/05/2014         Relevant Orders   LDL cholesterol, direct   Lipid panel   Hypertension - Primary    Well controlled on current regimen. Renal function stable, no changes today.  Lab Results  Component Value Date   CREATININE 1.28 (H) 11/25/2015   Lab Results  Component Value Date   NA 138 11/25/2015   K 4.8 11/25/2015   CL 103 11/25/2015   CO2 25 11/25/2015         Relevant Orders   Comprehensive metabolic panel   Hyperthyroidism   Relevant Orders   TSH    Other Visit Diagnoses    Vitamin D deficiency       Relevant Orders   VITAMIN D 25 Hydroxy (Vit-D Deficiency, Fractures)     A total of 25 minutes was spent with patient more than half of which was spent in counseling patient on the above mentioned issues , reviewing and explaining recent labs and imaging studies done, and coordination of care. I have discontinued Mr. Kayes's doxycycline. I am also having him maintain his methimazole, Vitamin D3, cyanocobalamin, b complex vitamins, glipiZIDE, COMBIVENT RESPIMAT, Tiotropium Bromide Monohydrate (SPIRIVA RESPIMAT IN), aspirin EC, metFORMIN, benazepril, simvastatin, omeprazole, tamsulosin, and metoprolol succinate. We will continue to administer ipratropium-albuterol.  No orders of the defined types were placed in this encounter.   Medications Discontinued During This Encounter  Medication Reason  . doxycycline (VIBRA-TABS) 100 MG tablet Completed Course    Follow-up: No Follow-up on file.   [name], MD

## 2016-03-12 NOTE — Patient Instructions (Addendum)
You should be taking omeprazole for your stomach and NOT ibuprofen   Your recent abdominal pain episode may have been diverticulitis.  Please call the next time it occurs so I can obtain  a CT scan and treat you   Referrals to Dr George Ina and Dr Milinda Pointer are under way

## 2016-03-13 LAB — LIPID PANEL
Cholesterol: 141 mg/dL (ref 0–200)
HDL: 80.9 mg/dL (ref >39.00)
LDL Cholesterol: 40 mg/dL (ref 0–99)
NonHDL: 60.39
Total CHOL/HDL Ratio: 2
Triglycerides: 102 mg/dL (ref 0.0–149.0)
VLDL: 20.4 mg/dL (ref 0.0–40.0)

## 2016-03-13 LAB — CBC WITH DIFFERENTIAL/PLATELET
BASOS ABS: 0.1 10*3/uL (ref 0.0–0.1)
BASOS PCT: 1 % (ref 0.0–3.0)
EOS ABS: 0.4 10*3/uL (ref 0.0–0.7)
Eosinophils Relative: 4.3 % (ref 0.0–5.0)
HEMATOCRIT: 34 % — AB (ref 39.0–52.0)
HEMOGLOBIN: 11.3 g/dL — AB (ref 13.0–17.0)
LYMPHS PCT: 24.2 % (ref 12.0–46.0)
Lymphs Abs: 2 10*3/uL (ref 0.7–4.0)
MCHC: 33.2 g/dL (ref 30.0–36.0)
MCV: 95.5 fl (ref 78.0–100.0)
MONO ABS: 1 10*3/uL (ref 0.1–1.0)
Monocytes Relative: 11.6 % (ref 3.0–12.0)
Neutro Abs: 4.9 10*3/uL (ref 1.4–7.7)
Neutrophils Relative %: 58.9 % (ref 43.0–77.0)
Platelets: 292 10*3/uL (ref 150.0–400.0)
RBC: 3.56 Mil/uL — AB (ref 4.22–5.81)
RDW: 13.3 % (ref 11.5–15.5)
WBC: 8.3 10*3/uL (ref 4.0–10.5)

## 2016-03-13 LAB — LDL CHOLESTEROL, DIRECT: LDL DIRECT: 49 mg/dL

## 2016-03-13 LAB — COMPREHENSIVE METABOLIC PANEL
ALBUMIN: 4.1 g/dL (ref 3.5–5.2)
ALK PHOS: 52 U/L (ref 39–117)
ALT: 12 U/L (ref 0–53)
AST: 18 U/L (ref 0–37)
BILIRUBIN TOTAL: 0.6 mg/dL (ref 0.2–1.2)
BUN: 15 mg/dL (ref 6–23)
CO2: 28 mEq/L (ref 19–32)
CREATININE: 1.28 mg/dL (ref 0.40–1.50)
Calcium: 9.5 mg/dL (ref 8.4–10.5)
Chloride: 102 mEq/L (ref 96–112)
GFR: 57.31 mL/min — ABNORMAL LOW (ref >60.00)
Glucose, Bld: 109 mg/dL — ABNORMAL HIGH (ref 70–99)
Potassium: 4.4 mEq/L (ref 3.5–5.1)
Sodium: 137 mEq/L (ref 135–145)
Total Protein: 6.3 g/dL (ref 6.0–8.3)

## 2016-03-13 LAB — HEMOGLOBIN A1C: HEMOGLOBIN A1C: 6.8 % — AB (ref 4.6–6.5)

## 2016-03-13 LAB — VITAMIN D 25 HYDROXY (VIT D DEFICIENCY, FRACTURES): VITD: 37.05 ng/mL (ref 30.00–100.00)

## 2016-03-13 LAB — TSH: TSH: 1.54 u[IU]/mL (ref 0.35–4.50)

## 2016-03-13 NOTE — Assessment & Plan Note (Signed)
Well controlled currently,  Managed by Endocrinology. . Last a1c was 6.7 in  october and was ordered today,  He has no proteinuria.  Foot exam notable for decreased pulses right foot.  And spotty sensation

## 2016-03-13 NOTE — Assessment & Plan Note (Signed)
Occurring post prandially for 1-2 weeks, Now resolved.  ddx discussed with patient including diverticulitis , chronic mesenteric ischemia. Workup deferred today by patient. Advised to call office for next recurrence so imaging can be obtained.  Marland Kitchen

## 2016-03-13 NOTE — Assessment & Plan Note (Signed)
Well controlled on current regimen. Renal function stable, no changes today.  Lab Results  Component Value Date   CREATININE 1.28 (H) 11/25/2015   Lab Results  Component Value Date   NA 138 11/25/2015   K 4.8 11/25/2015   CL 103 11/25/2015   CO2 25 11/25/2015

## 2016-03-13 NOTE — Assessment & Plan Note (Signed)
Well controlled on current statin therapy.   Liver enzymes are over due Lab Results  Component Value Date   CHOL 165 11/25/2015   HDL 87 11/25/2015   LDLCALC 49 11/25/2015   LDLDIRECT 66 11/25/2015   TRIG 143 11/25/2015   CHOLHDL 1.9 11/25/2015   Lab Results  Component Value Date   ALT 11 07/05/2014   AST 16 07/05/2014   ALKPHOS 62 07/05/2014   BILITOT 0.7 07/05/2014

## 2016-03-13 NOTE — Assessment & Plan Note (Signed)
Non critical.  Taking asa and lipitor.  LDL at goal   Lab Results  Component Value Date   CHOL 165 11/25/2015   HDL 87 11/25/2015   LDLCALC 49 11/25/2015   LDLDIRECT 66 11/25/2015   TRIG 143 11/25/2015   CHOLHDL 1.9 11/25/2015

## 2016-03-14 ENCOUNTER — Encounter: Payer: Self-pay | Admitting: Internal Medicine

## 2016-03-15 ENCOUNTER — Encounter: Payer: Self-pay | Admitting: Internal Medicine

## 2016-04-04 ENCOUNTER — Telehealth: Payer: Self-pay | Admitting: Internal Medicine

## 2016-04-04 ENCOUNTER — Ambulatory Visit (INDEPENDENT_AMBULATORY_CARE_PROVIDER_SITE_OTHER): Payer: PPO

## 2016-04-04 ENCOUNTER — Ambulatory Visit (INDEPENDENT_AMBULATORY_CARE_PROVIDER_SITE_OTHER): Payer: PPO | Admitting: Podiatry

## 2016-04-04 ENCOUNTER — Encounter: Payer: Self-pay | Admitting: *Deleted

## 2016-04-04 ENCOUNTER — Encounter: Payer: Self-pay | Admitting: Podiatry

## 2016-04-04 VITALS — BP 125/65 | HR 92 | Resp 16

## 2016-04-04 DIAGNOSIS — E1142 Type 2 diabetes mellitus with diabetic polyneuropathy: Secondary | ICD-10-CM | POA: Diagnosis not present

## 2016-04-04 DIAGNOSIS — E119 Type 2 diabetes mellitus without complications: Secondary | ICD-10-CM | POA: Diagnosis not present

## 2016-04-04 DIAGNOSIS — B351 Tinea unguium: Secondary | ICD-10-CM | POA: Diagnosis not present

## 2016-04-04 DIAGNOSIS — M79676 Pain in unspecified toe(s): Secondary | ICD-10-CM

## 2016-04-04 DIAGNOSIS — Q828 Other specified congenital malformations of skin: Secondary | ICD-10-CM | POA: Diagnosis not present

## 2016-04-04 DIAGNOSIS — Z0189 Encounter for other specified special examinations: Secondary | ICD-10-CM

## 2016-04-04 NOTE — Progress Notes (Signed)
   Subjective:    Patient ID: Terry [name], male    DOB: 1933/11/28, 83 y.o.   MRN: [id number]  HPI: He presents today with a chief complaint of a 4-6 week period where he has developed pain on the plantar aspect of the bilateral foot stating that he feels like he is walking on rocks. He also has a painful callus to the medial aspect of the hallux right he states that he really doesn't hurt when he is walking and less his toes touching each other.    Review of Systems  All other systems reviewed and are negative.      Objective:   Physical Exam: Vital signs are stable he is alert and oriented 3. Pulses are palpable. Neurologic sensorium is intact. Deep tendon reflexes are intact. Muscle strength is 5 over 5 dorsiflexion plantar flexors and inverters everters onto the musculatures intact. Orthopedic evaluation x-rays all joints distal to the ankle for range of motion without crepitation. He has a reactive hyperkeratotic medial aspect of the IP joint of the hallux right. To be a porokeratosis there's no signs of infection once debrided. Symptoms have been alleviated with debridement. His toenails are long thick yellow dystrophic and clinically mycotic.          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 Porokeratosis hallux right. Pain limbs sigmoid onychomycosis  Plan: Debridement of the lesion today and application of padding. Debrided toenails 1 through 5. Follow up with him for a diabetic check in 6 months.

## 2016-04-04 NOTE — Telephone Encounter (Signed)
Left pt message asking to call Allison back directly at [phone] to schedule AWV. Thanks! °

## 2016-04-08 ENCOUNTER — Other Ambulatory Visit: Payer: Self-pay | Admitting: Internal Medicine

## 2016-04-09 ENCOUNTER — Other Ambulatory Visit: Payer: Self-pay | Admitting: Internal Medicine

## 2016-05-22 NOTE — Telephone Encounter (Signed)
Scheduled 05/28/16

## 2016-05-24 DIAGNOSIS — E119 Type 2 diabetes mellitus without complications: Secondary | ICD-10-CM | POA: Diagnosis not present

## 2016-05-25 ENCOUNTER — Ambulatory Visit: Payer: PPO | Admitting: Internal Medicine

## 2016-05-25 LAB — HM DIABETES EYE EXAM

## 2016-05-28 ENCOUNTER — Ambulatory Visit: Payer: PPO

## 2016-06-01 ENCOUNTER — Ambulatory Visit: Payer: PPO | Admitting: Internal Medicine

## 2016-06-13 NOTE — Telephone Encounter (Signed)
2nd attempt to schedule carotid. Patient wants to talk to Fletcher Anon first before doing test he will call back some other time to schedule an appointment.

## 2016-06-27 ENCOUNTER — Telehealth: Payer: Self-pay

## 2016-06-27 NOTE — Telephone Encounter (Signed)
3 attempts to schedule 1 year fu carotid u/s  appt from recall list.    05/22/16 no ans no vm  06/15/16  Not available will call us when ready to schedule   Mailed letter to communicate reminder to have 1 yr fu test.   Patient has another cardiologist and is no longer seeing Dr. Fletcher Anon per previous phone not Jan. 2018

## 2016-07-13 ENCOUNTER — Other Ambulatory Visit: Payer: Self-pay | Admitting: Internal Medicine

## 2016-07-18 ENCOUNTER — Encounter: Payer: Self-pay | Admitting: Family

## 2016-07-18 ENCOUNTER — Ambulatory Visit (INDEPENDENT_AMBULATORY_CARE_PROVIDER_SITE_OTHER): Payer: PPO | Admitting: Family

## 2016-07-18 VITALS — BP 106/56 | HR 89 | Temp 98.2°F | Ht 72.0 in | Wt 168.2 lb

## 2016-07-18 DIAGNOSIS — W57XXXA Bitten or stung by nonvenomous insect and other nonvenomous arthropods, initial encounter: Secondary | ICD-10-CM | POA: Diagnosis not present

## 2016-07-18 DIAGNOSIS — S80862A Insect bite (nonvenomous), left lower leg, initial encounter: Secondary | ICD-10-CM

## 2016-07-18 MED ORDER — DOXYCYCLINE HYCLATE 100 MG PO TABS: 100.0000 mg | ORAL_TABLET | Freq: Two times a day (BID) | ORAL | 0 refills | Status: DC

## 2016-07-18 NOTE — Progress Notes (Signed)
Subjective:    Patient ID: Terry [name], male    DOB: Nov 06, 1933, 83 y.o.   MRN: [id number]  CC: Terry [name] is a 83 y.o. male who presents today for an acute visit.    HPI: Tick bite x 2 weeks to left shin, unchanged. Pulled off its entirety. Attached for 3 days.   Red,  Tender. Hasn't tried any medication. Unsure what type of tick.   No F, n,v, arthralgias, bulls eye rash.  Overall feels well.       HISTORY:  Past Medical History:  Diagnosis Date  . Anemia   . Diabetes mellitus   . Hyperlipidemia   . Hypertension   . hyperthyroidism   . Squamous cell carcinoma of skin of left upper arm November 2015   Excised to negative margins, associated keratoacanthoma.   Past Surgical History:  Procedure Laterality Date  . APPENDECTOMY  1970  . ARM SKIN LESION BIOPSY / EXCISION Left 2016  . CATARACT EXTRACTION, BILATERAL  2012   [facility]  . COLONOSCOPY  12-10-12  . COLONOSCOPY W/ POLYPECTOMY  2011   Dr. Raina Mina   Family History  Problem Relation Age of Onset  . Hypertension Father   . Diabetes Maternal Aunt   . Hyperlipidemia Maternal Uncle   . Birth defects Neg Hx     Allergies: Patient has no known allergies. Current Outpatient Prescriptions on File Prior to Visit  Medication Sig Dispense Refill  . aspirin EC 81 MG tablet Take 1 tablet (81 mg total) by mouth daily. 90 tablet 3  . b complex vitamins tablet Take 1 tablet by mouth daily.    . benazepril (LOTENSIN) 40 MG tablet TAKE 1 TABLET (40 MG TOTAL) BY MOUTH DAILY. 90 tablet 1  . Cholecalciferol (VITAMIN D3) 1000 UNITS CAPS Take by mouth.    . COMBIVENT RESPIMAT 20-100 MCG/ACT AERS respimat Use as directed 1 puff in the mouth or throat 4 (four) times daily as needed.  2  . cyanocobalamin 1000 MCG tablet Take 1,000 mcg by mouth daily.    . metFORMIN (GLUCOPHAGE-XR) 500 MG 24 hr tablet Take 500 mg by mouth daily.    . methimazole (TAPAZOLE) 5 MG tablet Take 2.5 mg by mouth daily.    . metoprolol succinate  (TOPROL-XL) 50 MG 24 hr tablet TAKE 1 TABLET (50 MG TOTAL) BY MOUTH DAILY. 90 tablet 1  . omeprazole (PRILOSEC) 40 MG capsule TAKE ONE CAPSULE BY MOUTH EVERY DAY 30 capsule 3  . simvastatin (ZOCOR) 40 MG tablet TAKE 1 TABLET BY MOUTH AT BEDTIME 30 tablet 3  . tamsulosin (FLOMAX) 0.4 MG CAPS capsule TAKE 1 CAPSULE (0.4 MG TOTAL) BY MOUTH DAILY. 30 capsule 3  . Tiotropium Bromide Monohydrate (SPIRIVA RESPIMAT IN) Inhale 1 spray into the lungs daily.     Current Facility-Administered Medications on File Prior to Visit  Medication Dose Route Frequency Provider Last Rate Last Dose  . ipratropium-albuterol (DUONEB) 0.5-2.5 (3) MG/3ML nebulizer solution 3 mL  3 mL Nebulization Q6H Nayleah Gamel, Yvetta Coder, FNP        Social History  Substance Use Topics  . Smoking status: Former Smoker    Packs/day: 2.00    Years: 50.00    Types: Cigarettes    Quit date: 06/26/1999  . Smokeless tobacco: Never Used  . Alcohol use 15.0 oz/week    25 Glasses of wine per week    Review of Systems  Constitutional: Negative for chills and fever.  Respiratory: Negative for cough.  Cardiovascular: Negative for chest pain and palpitations.  Gastrointestinal: Negative for nausea and vomiting.  Musculoskeletal: Negative for arthralgias.  Skin: Positive for color change and wound.      Objective:    BP (!) 106/56   Pulse 89   Temp 98.2 F (36.8 C) (Oral)   Ht 6' (1.829 m)   Wt 168 lb 3.2 oz (76.3 kg)   SpO2 93%   BMI 22.81 kg/m    Physical Exam  Constitutional: He appears well-developed and well-nourished.  Cardiovascular: Regular rhythm and normal heart sounds.   Pulmonary/Chest: Effort normal and breath sounds normal. No respiratory distress. He has no wheezes. He has no rhonchi. He has no rales.  Neurological: He is alert.  Skin: Skin is warm and dry. There is erythema.     Erythematous area approximately 2-3 cm left lower shin as noted on diagram. Clearly demarcated. No streaking. No drainage. In the  center, scab noted. No foreign bodies or remains of tick appreciated. Slight increased warmth.  Psychiatric: He has a normal mood and affect. His speech is normal and behavior is normal.  Vitals reviewed.      Assessment & Plan:   1. Tick bite, initial encounter Afebrile and no systemic features. Based on duration which tick was attached and cellulitis appearing rash to lower leg, reasonable to go ahead and start doxycycline. Return precautions education provided on Lyme disease given to patient.  - doxycycline (VIBRA-TABS) 100 MG tablet; Take 1 tablet (100 mg total) by mouth 2 (two) times daily.  Dispense: 14 tablet; Refill: 0    I have discontinued Mr. Stemen's glipiZIDE. I am also having him start on doxycycline. Additionally, I am having him maintain his methimazole, Vitamin D3, cyanocobalamin, b complex vitamins, COMBIVENT RESPIMAT, Tiotropium Bromide Monohydrate (SPIRIVA RESPIMAT IN), aspirin EC, metFORMIN, benazepril, omeprazole, tamsulosin, simvastatin, and metoprolol succinate. We will continue to administer ipratropium-albuterol.   Meds ordered this encounter  Medications  . doxycycline (VIBRA-TABS) 100 MG tablet    Sig: Take 1 tablet (100 mg total) by mouth 2 (two) times daily.    Dispense:  14 tablet    Refill:  0    Order Specific Question:   Supervising Provider    Answer:   [name] [[id number]]    Return precautions given.   Risks, benefits, and alternatives of the medications and treatment plan prescribed today were discussed, and patient expressed understanding.   Education regarding symptom management and diagnosis given to patient on AVS.  Continue to follow with [name], MD for routine health maintenance.   Terry [name] and I agreed with plan.   Mable Paris, FNP

## 2016-07-18 NOTE — Progress Notes (Signed)
Pre visit review using our clinic review tool, if applicable. No additional management support is needed unless otherwise documented below in the visit note. 

## 2016-07-18 NOTE — Patient Instructions (Signed)
He may take Benadryl oral or topical for itching. Please start doxycycline  Ensure to take probiotics while on antibiotics and also for 2 weeks after completion. It is important to re-colonize the gut with good bacteria and also to prevent any diarrheal infections associated with antibiotic use.   As discussed, it is too early to test for Lyme titers. If any symptoms of Lyme disease, please let our office know and schedule an appointment for reevaluation   Tick Bite Information, Adult Ticks are insects that draw blood for food. Most ticks live in shrubs and grassy areas. They climb onto people and animals that brush against the leaves and grasses that they rest on. Then they bite, attaching themselves to the skin. Most ticks are harmless, but some ticks carry germs that can spread to a person through a bite and cause a disease. To reduce your risk of getting a disease from a tick bite, it is important to take steps to prevent tick bites. It is also important to check for ticks after being outdoors. If you find that a tick has attached to you, watch for symptoms of disease. How can I prevent tick bites? Take these steps to help prevent tick bites when you are outdoors in an area where ticks are found:  Use insect repellent that has DEET (20% or higher), picaridin, or IR3535 in it. Use it on: ? Skin that is showing. ? The top of your boots. ? Your pant legs. ? Your sleeve cuffs.  For repellent products that contain permethrin, follow product instructions. Use these products on: ? Clothing. ? Gear. ? Boots. ? Tents.  Wear protective clothing. Long sleeves and long pants offer the best protection from ticks.  Wear light-colored clothing so you can see ticks more easily.  Tuck your pant legs into your socks.  If you go walking on a trail, stay in the middle of the trail so your skin, hair, and clothing do not touch the bushes.  Avoid walking through areas with long grass.  Check for  ticks on your clothing, hair, and skin often while you are outside, and check again before you go inside. Make sure to check the places that ticks attach themselves most often. These places include the scalp, neck, armpits, waist, groin, and joint areas. Ticks that carry a disease called Lyme disease have to be attached to the skin for 24-48 hours. Checking for ticks every day will lessen your risk of this and other diseases.  When you come indoors, wash your clothes and take a shower or a bath right away. Dry your clothes in a dryer on high heat for at least 60 minutes. This will kill any ticks in your clothes.  What is the proper way to remove a tick? If you find a tick on your body, remove it as soon as possible. Removing a tick sooner rather than later can prevent germs from passing from the tick to your body. To remove a tick that is crawling on your skin but has not bitten:  Go outdoors and brush the tick off.  Remove the tick with tape or a lint roller.  To remove a tick that is attached to your skin:  Wash your hands.  If you have latex gloves, put them on.  Use tweezers, curved forceps, or a tick-removal tool to gently grasp the tick as close to your skin and the tick's head as possible.  Gently pull with steady, upward pressure until the tick lets go.  When removing the tick: ? Take care to keep the tick's head attached to its body. ? Do not twist or jerk the tick. This can make the tick's head or mouth break off. ? Do not squeeze or crush the tick's body. This could force disease-carrying fluids from the tick into your body.  Do not try to remove a tick with heat, alcohol, petroleum jelly, or fingernail polish. Using these methods can cause the tick to salivate and regurgitate into your bloodstream, increasing your risk of getting a disease. What should I do after removing a tick?  Clean the bite area with soap and water, rubbing alcohol, or an iodine scrub.  If an antiseptic  cream or ointment is available, apply a small amount to the bite site.  Wash and disinfect any instruments that you used to remove the tick. How should I dispose of a tick? To dispose of a live tick, use one of these methods:  Place it in rubbing alcohol.  Place it in a sealed bag or container.  Wrap it tightly in tape.  Flush it down the toilet.  Contact a health care provider if:  You have symptoms of a disease after a tick bite. Symptoms of a tick-borne disease can occur from moments after the tick bites to up to 30 days after a tick is removed. Symptoms include: ? Muscle, joint, or bone pain. ? Difficulty walking or moving your legs. ? Numbness in the legs. ? Paralysis. ? Red rash around the tick bite area that is shaped like a target or a "bull's-eye." ? Redness and swelling in the area of the tick bite. ? Fever. ? Repeated vomiting. ? Diarrhea. ? Weight loss. ? Tender, swollen lymph glands. ? Shortness of breath. ? Cough. ? Pain in the abdomen. ? Headache. ? Abnormal tiredness. ? A change in your level of consciousness. ? Confusion. Get help right away if:  You are not able to remove a tick.  A part of a tick breaks off and gets stuck in your skin.  Your symptoms get worse. Summary  Ticks may carry germs that can spread to a person through a bite and cause disease.  Wear protective clothing and use insect repellent to prevent tick bites. Follow product instructions.  If you find a tick on your body, remove it as soon as possible. If the tick is attached, do not try to remove with heat, alcohol, petroleum jelly, or fingernail polish.  Remove the attached tick using tweezers, curved forceps, or a tick-removal tool. Gently pull with steady, upward pressure until the tick lets go. Do not twist or jerk the tick. Do not squeeze or crush the tick's body.  If you have symptoms after being bitten by a tick, contact a health care provider. This information is not  intended to replace advice given to you by your health care provider. Make sure you discuss any questions you have with your health care provider. Document Released: 01/13/2000 Document Revised: 10/28/2015 Document Reviewed: 10/28/2015 Elsevier Interactive Patient Education  [employer].

## 2016-07-23 ENCOUNTER — Encounter: Payer: Self-pay | Admitting: Internal Medicine

## 2016-07-23 ENCOUNTER — Ambulatory Visit (INDEPENDENT_AMBULATORY_CARE_PROVIDER_SITE_OTHER): Payer: PPO | Admitting: Internal Medicine

## 2016-07-23 VITALS — BP 128/60 | HR 84 | Temp 98.1°F | Resp 16 | Ht 72.0 in | Wt 169.4 lb

## 2016-07-23 DIAGNOSIS — Z1211 Encounter for screening for malignant neoplasm of colon: Secondary | ICD-10-CM

## 2016-07-23 DIAGNOSIS — R634 Abnormal weight loss: Secondary | ICD-10-CM

## 2016-07-23 DIAGNOSIS — R1032 Left lower quadrant pain: Secondary | ICD-10-CM

## 2016-07-23 DIAGNOSIS — R1031 Right lower quadrant pain: Secondary | ICD-10-CM

## 2016-07-23 DIAGNOSIS — R3911 Hesitancy of micturition: Secondary | ICD-10-CM

## 2016-07-23 DIAGNOSIS — R809 Proteinuria, unspecified: Secondary | ICD-10-CM | POA: Diagnosis not present

## 2016-07-23 LAB — POCT URINALYSIS DIPSTICK
GLUCOSE UA: NEGATIVE
Leukocytes, UA: NEGATIVE
NITRITE UA: NEGATIVE
PH UA: 5.5 (ref 5.0–8.0)
RBC UA: NEGATIVE
Spec Grav, UA: 1.02 (ref 1.010–1.025)
UROBILINOGEN UA: 0.2 U/dL

## 2016-07-23 NOTE — Progress Notes (Signed)
Subjective:  Patient ID: Terry [name], male    DOB: [birth year]  Age: 83 y.o. MRN: [id number]  CC: The primary encounter diagnosis was Urinary hesitancy. Diagnoses of Screening for colon cancer, Abdominal pain, bilateral lower quadrant, Proteinuria, unspecified type, and Weight loss, unintentional were also pertinent to this visit.  HPI Terry [name] presents for evaluation of  Chronic abdominal pain.  The pain has been present intermittently   For the past 8 months, and has been occurring in an Episodic pattern.  It is accompanied by fatigue,  Rare occurrences of l diarrhea.  He states that for the last 4 months of 2017 he had recurrent early satiety secondary to post prandial nausea that would begin before finishing his evening meal. He states that these symptoms Resolved end of December.  Then started having abd pain during the day while at work., unrelated to eating.   The pain is diffuse but at times localized to the lower abdomen.  He denies any recent or past regular use of NSAIDs but dDoes take a a baby asa daily.  Stopped drinking alcohol on May 16.   Has had an unintentional weight loss of 13 lbs since last visit Feb 2018 Last colonoscopy Nov 2014 2 cm polyp removed, villous adenoma,   Multiple  smaller ones,  hyperplastic .   CT ab pelvis was done in Oct 2015 for lower abd pain . Diverticulosis, prostate enlargement and  large bladder  Diverticulum were noted. He was referred to urology, saw Dr. Louis Meckel , told NTD return in one year  Reports that despite taking Flomax,  He has very small voids and does not urinate for 8 hours during work hours.      Outpatient Medications Prior to Visit  Medication Sig Dispense Refill  . aspirin EC 81 MG tablet Take 1 tablet (81 mg total) by mouth daily. 90 tablet 3  . b complex vitamins tablet Take 1 tablet by mouth daily.    . benazepril (LOTENSIN) 40 MG tablet TAKE 1 TABLET (40 MG TOTAL) BY MOUTH DAILY. 90 tablet 1  . Cholecalciferol  (VITAMIN D3) 1000 UNITS CAPS Take by mouth.    . COMBIVENT RESPIMAT 20-100 MCG/ACT AERS respimat Use as directed 1 puff in the mouth or throat 4 (four) times daily as needed.  2  . cyanocobalamin 1000 MCG tablet Take 1,000 mcg by mouth daily.    Marland Kitchen doxycycline (VIBRA-TABS) 100 MG tablet Take 1 tablet (100 mg total) by mouth 2 (two) times daily. 14 tablet 0  . metFORMIN (GLUCOPHAGE-XR) 500 MG 24 hr tablet Take 500 mg by mouth daily.    . methimazole (TAPAZOLE) 5 MG tablet Take 2.5 mg by mouth daily.    . metoprolol succinate (TOPROL-XL) 50 MG 24 hr tablet TAKE 1 TABLET (50 MG TOTAL) BY MOUTH DAILY. 90 tablet 1  . omeprazole (PRILOSEC) 40 MG capsule TAKE ONE CAPSULE BY MOUTH EVERY DAY 30 capsule 3  . simvastatin (ZOCOR) 40 MG tablet TAKE 1 TABLET BY MOUTH AT BEDTIME 30 tablet 3  . tamsulosin (FLOMAX) 0.4 MG CAPS capsule TAKE 1 CAPSULE (0.4 MG TOTAL) BY MOUTH DAILY. 30 capsule 3  . Tiotropium Bromide Monohydrate (SPIRIVA RESPIMAT IN) Inhale 1 spray into the lungs daily.     Facility-Administered Medications Prior to Visit  Medication Dose Route Frequency Provider Last Rate Last Dose  . ipratropium-albuterol (DUONEB) 0.5-2.5 (3) MG/3ML nebulizer solution 3 mL  3 mL Nebulization Q6H Arnett, Yvetta Coder, FNP  Review of Systems;  Patient denies headache, fevers, malaise, unintentional weight loss, skin rash, eye pain, sinus congestion and sinus pain, sore throat, dysphagia,  hemoptysis , cough, dyspnea, wheezing, chest pain, palpitations, orthopnea, edema, abdominal pain, nausea, melena, diarrhea, constipation, flank pain, dysuria, hematuria, urinary  Frequency, nocturia, numbness, tingling, seizures,  Focal weakness, Loss of consciousness,  Tremor, insomnia, depression, anxiety, and suicidal ideation.      Objective:  BP 128/60   Pulse 84   Temp 98.1 F (36.7 C) (Oral)   Resp 16   Ht 6' (1.829 m)   Wt 169 lb 6.4 oz (76.8 kg)   SpO2 94%   BMI 22.97 kg/m   BP Readings from Last 3  Encounters:  07/23/16 128/60  07/18/16 (!) 106/56  04/04/16 125/65    Wt Readings from Last 3 Encounters:  07/23/16 169 lb 6.4 oz (76.8 kg)  07/18/16 168 lb 3.2 oz (76.3 kg)  03/12/16 181 lb (82.1 kg)    General appearance: alert, cooperative and appears stated age Ears: normal TM's and external ear canals both ears Throat: lips, mucosa, and tongue normal; teeth and gums normal Neck: no adenopathy, no carotid bruit, supple, symmetrical, trachea midline and thyroid not enlarged, symmetric, no tenderness/mass/nodules Back: symmetric, no curvature. ROM normal. No CVA tenderness. Lungs: clear to auscultation bilaterally Heart: regular rate and rhythm, S1, S2 normal, no murmur, click, rub or gallop Abdomen: soft, non-tender; distended, bowel sounds normal; no masses,  no organomegaly Pulses: 2+ and symmetric Skin: Skin color, texture, turgor normal. No rashes or lesions Lymph nodes: Cervical, supraclavicular, and axillary nodes normal.  Lab Results  Component Value Date   HGBA1C 6.8 (H) 03/12/2016   HGBA1C 6.7 11/11/2015   HGBA1C 6.6 11/05/2014    Lab Results  Component Value Date   CREATININE 1.28 03/12/2016   CREATININE 1.28 (H) 11/25/2015   CREATININE 1.52 (H) 07/05/2014    Lab Results  Component Value Date   WBC 8.3 03/12/2016   HGB 11.3 (L) 03/12/2016   HCT 34.0 (L) 03/12/2016   PLT 292.0 03/12/2016   GLUCOSE 109 (H) 03/12/2016   CHOL 141 03/12/2016   TRIG 102.0 03/12/2016   HDL 80.90 03/12/2016   LDLDIRECT 49.0 03/12/2016   LDLCALC 40 03/12/2016   ALT 12 03/12/2016   AST 18 03/12/2016   NA 137 03/12/2016   K 4.4 03/12/2016   CL 102 03/12/2016   CREATININE 1.28 03/12/2016   BUN 15 03/12/2016   CO2 28 03/12/2016   TSH 1.54 03/12/2016   PSA 0.75 11/12/2013   INR 0.9 12/12/2012   HGBA1C 6.8 (H) 03/12/2016   MICROALBUR 12.2 (H) 03/12/2016    Nm Myocar Multi W/spect W/wall Motion / Ef  Result Date: 05/16/2015  There was no ST segment deviation noted  during stress.  Defect 1: There is a small defect of mild severity present in the apical anterior and apex location.  No T wave inversion was noted during stress.  Findings consistent with mild ischemia in distal LAD distribution. .  This is a low risk study.  The left ventricular ejection fraction is normal (55-65%).  Poor study due to intense GI uptake. Correlate clinically.     Assessment & Plan:   Problem List Items Addressed This Visit    Weight loss, unintentional    secondary to loss of appetite , nausea . Workup in progress. Thyroid function normal Feb 2018 Lab Results  Component Value Date   TSH 1.54 03/12/2016  Proteinuria    Likely secondary to diabetic nephropathy. Patient taking ACE I nhibitor and getting regular follow up with Endocrinology.  Lab Results  Component Value Date   CREATININE 1.28 03/12/2016   Lab Results  Component Value Date   MICROALBUR 12.2 (H) 03/12/2016         Abdominal pain, bilateral lower quadrant    He deferred workup in February but is now willing to have evaluation.  He is overdue for follow up colonoscopy and urology evaluation,  Exam and history suggest bladder diverticulum with incomplete emptying may be the cause of his abdominal distension . Bladder ultrasound and post void residual ordered, along with referral to [facility] for colonoscopy .       Other Visit Diagnoses    Urinary hesitancy    -  Primary   Relevant Orders   POCT urinalysis dipstick (Completed)   Urine Microscopic Only   Urine Culture   US Pelvis Limited   Screening for colon cancer       Relevant Orders   Ambulatory referral to General Surgery      I am having Mr. Terry [name] maintain his methimazole, Vitamin D3, cyanocobalamin, b complex vitamins, COMBIVENT RESPIMAT, Tiotropium Bromide Monohydrate (SPIRIVA RESPIMAT IN), aspirin EC, metFORMIN, benazepril, omeprazole, tamsulosin, simvastatin, metoprolol succinate, and doxycycline. We will continue to  administer ipratropium-albuterol.  No orders of the defined types were placed in this encounter.   There are no discontinued medications.  Follow-up: No Follow-up on file.   [name], MD

## 2016-07-23 NOTE — Patient Instructions (Signed)
Your abdominal pain may be coming from incomplete emptying of your bladder causing it to become distended  I am ordering urine studies and a bladder scan  You are also due for repeat colonoscopy, because of the polyps found in 2014,  This is important because you have lost weight

## 2016-07-24 ENCOUNTER — Telehealth: Payer: Self-pay | Admitting: Internal Medicine

## 2016-07-24 ENCOUNTER — Encounter: Payer: Self-pay | Admitting: Internal Medicine

## 2016-07-24 DIAGNOSIS — R634 Abnormal weight loss: Secondary | ICD-10-CM | POA: Insufficient documentation

## 2016-07-24 LAB — URINALYSIS, MICROSCOPIC ONLY

## 2016-07-24 NOTE — Assessment & Plan Note (Signed)
secondary to loss of appetite , nausea . Workup in progress. Thyroid function normal Feb 2018 Lab Results  Component Value Date   TSH 1.54 03/12/2016

## 2016-07-24 NOTE — Assessment & Plan Note (Addendum)
He deferred workup in February but is now willing to have evaluation.  He is overdue for follow up colonoscopy and urology evaluation,  Exam and history suggest bladder diverticulum with incomplete emptying may be the cause of his abdominal distension . Bladder ultrasound and post void residual ordered, along with referral to [facility] for colonoscopy .

## 2016-07-24 NOTE — Telephone Encounter (Signed)
Pilar Plate would like me to call him back next week to schedule AWV.  *Note-after 2 pm

## 2016-07-24 NOTE — Assessment & Plan Note (Signed)
Likely secondary to diabetic nephropathy. Patient taking ACE I nhibitor and getting regular follow up with Endocrinology.  Lab Results  Component Value Date   CREATININE 1.28 03/12/2016   Lab Results  Component Value Date   MICROALBUR 12.2 (H) 03/12/2016

## 2016-07-25 ENCOUNTER — Encounter: Payer: Self-pay | Admitting: Internal Medicine

## 2016-07-25 LAB — URINE CULTURE: ORGANISM ID, BACTERIA: NO GROWTH

## 2016-07-30 ENCOUNTER — Ambulatory Visit
Admission: RE | Admit: 2016-07-30 | Discharge: 2016-07-30 | Disposition: A | Discharge status: Home / Self Care | Payer: PPO | Source: Ambulatory Visit | Attending: Internal Medicine | Admitting: Internal Medicine

## 2016-07-30 DIAGNOSIS — R3911 Hesitancy of micturition: Secondary | ICD-10-CM

## 2016-07-30 DIAGNOSIS — R3914 Feeling of incomplete bladder emptying: Secondary | ICD-10-CM | POA: Insufficient documentation

## 2016-07-31 ENCOUNTER — Other Ambulatory Visit: Payer: Self-pay | Admitting: Internal Medicine

## 2016-07-31 ENCOUNTER — Encounter: Payer: Self-pay | Admitting: Internal Medicine

## 2016-07-31 DIAGNOSIS — R1031 Right lower quadrant pain: Secondary | ICD-10-CM

## 2016-07-31 DIAGNOSIS — D126 Benign neoplasm of colon, unspecified: Secondary | ICD-10-CM

## 2016-07-31 DIAGNOSIS — R1032 Left lower quadrant pain: Secondary | ICD-10-CM

## 2016-07-31 DIAGNOSIS — R634 Abnormal weight loss: Secondary | ICD-10-CM

## 2016-08-06 ENCOUNTER — Ambulatory Visit (INDEPENDENT_AMBULATORY_CARE_PROVIDER_SITE_OTHER): Payer: PPO | Admitting: General Surgery

## 2016-08-06 ENCOUNTER — Telehealth: Payer: Self-pay | Admitting: *Deleted

## 2016-08-06 ENCOUNTER — Encounter: Payer: Self-pay | Admitting: General Surgery

## 2016-08-06 VITALS — BP 120/60 | HR 68 | Resp 12 | Ht 68.0 in | Wt 167.0 lb

## 2016-08-06 DIAGNOSIS — R634 Abnormal weight loss: Secondary | ICD-10-CM

## 2016-08-06 DIAGNOSIS — Z86010 Personal history of colonic polyps: Secondary | ICD-10-CM | POA: Diagnosis not present

## 2016-08-06 DIAGNOSIS — R110 Nausea: Secondary | ICD-10-CM

## 2016-08-06 MED ORDER — POLYETHYLENE GLYCOL 3350 17 GM/SCOOP PO POWD: ORAL | 0 refills | Status: DC

## 2016-08-06 NOTE — Telephone Encounter (Signed)
FYI Spoke with Janett Billow at [facility] and they went ahead and drew  TSH and CMP, CBC with diff, patient will be having upcoming  Colonoscopy and Endoscopy and CT scan.

## 2016-08-06 NOTE — Progress Notes (Signed)
Patient ID: Terry [name], male   DOB: 04/13/1933, 83 y.o.   MRN: [id number]  Chief Complaint  Patient presents with  . Colonoscopy    HPI Terry [name] is a 83 y.o. male here today for a evaluation of a colonoscopy. Last colonoscopy was done in 2014. Patient states he has been constipated for the last week (Every other day) C. Have not used any medication to move his bowels. No blood or mucus noticed.  Normally he moves his  bowels daily. He states he has had some trouble with nausea before he finish his meals for several months, and to this he attributes his weight loss, but in the last month this has resolved. He has not however regained any weight.   Patient has had some lower abdominal pain in the last month.   Ultrasound done on 07/30/2016. He has lost 13 pound since February 2018.  He stopped drinking alcohol in May 2018.  HPI  Past Medical History:  Diagnosis Date  . Anemia   . Diabetes mellitus 2008  . Hyperlipidemia   . Hypertension   . hyperthyroidism   . Squamous cell carcinoma of skin of left upper arm November 2015   Excised to negative margins, associated keratoacanthoma.    Past Surgical History:  Procedure Laterality Date  . APPENDECTOMY  1970  . ARM SKIN LESION BIOPSY / EXCISION Left 2016  . CATARACT EXTRACTION, BILATERAL  2012   [facility]  . COLONOSCOPY  12-10-12  . COLONOSCOPY W/ POLYPECTOMY  2011   Dr. Raina Mina    Family History  Problem Relation Age of Onset  . Hypertension Father   . Diabetes Maternal Aunt   . Hyperlipidemia Maternal Uncle   . Birth defects Neg Hx     Social History Social History  Substance Use Topics  . Smoking status: Former Smoker    Packs/day: 2.00    Years: 50.00    Types: Cigarettes    Quit date: 06/26/1999  . Smokeless tobacco: Never Used  . Alcohol use No    No Known Allergies  Current Outpatient Prescriptions  Medication Sig Dispense Refill  . aspirin EC 81 MG tablet Take 1 tablet (81 mg total) by mouth  daily. 90 tablet 3  . b complex vitamins tablet Take 1 tablet by mouth daily.    . benazepril (LOTENSIN) 40 MG tablet TAKE 1 TABLET (40 MG TOTAL) BY MOUTH DAILY. 90 tablet 1  . Cholecalciferol (VITAMIN D3) 1000 UNITS CAPS Take by mouth.    . COMBIVENT RESPIMAT 20-100 MCG/ACT AERS respimat Use as directed 1 puff in the mouth or throat 4 (four) times daily as needed.  2  . cyanocobalamin 1000 MCG tablet Take 1,000 mcg by mouth daily.    Marland Kitchen doxycycline (VIBRA-TABS) 100 MG tablet Take 1 tablet (100 mg total) by mouth 2 (two) times daily. 14 tablet 0  . metFORMIN (GLUCOPHAGE-XR) 500 MG 24 hr tablet Take 500 mg by mouth daily.    . methimazole (TAPAZOLE) 5 MG tablet Take 2.5 mg by mouth daily.    . metoprolol succinate (TOPROL-XL) 50 MG 24 hr tablet TAKE 1 TABLET (50 MG TOTAL) BY MOUTH DAILY. 90 tablet 1  . omeprazole (PRILOSEC) 40 MG capsule TAKE ONE CAPSULE BY MOUTH EVERY DAY 30 capsule 3  . simvastatin (ZOCOR) 40 MG tablet TAKE 1 TABLET BY MOUTH AT BEDTIME 30 tablet 3  . tamsulosin (FLOMAX) 0.4 MG CAPS capsule TAKE 1 CAPSULE (0.4 MG TOTAL) BY MOUTH DAILY. 30 capsule 3  .  Tiotropium Bromide Monohydrate (SPIRIVA RESPIMAT IN) Inhale 1 spray into the lungs daily.    . polyethylene glycol powder (GLYCOLAX/MIRALAX) powder 255 grams one bottle for colonoscopy prep 255 g 0   Current Facility-Administered Medications  Medication Dose Route Frequency Provider Last Rate Last Dose  . ipratropium-albuterol (DUONEB) 0.5-2.5 (3) MG/3ML nebulizer solution 3 mL  3 mL Nebulization Q6H Burnard Hawthorne, FNP        Review of Systems Review of Systems  Constitutional: Negative.   Respiratory: Negative.   Cardiovascular: Negative.   Gastrointestinal: Negative.   Genitourinary: Positive for difficulty urinating and frequency.    Blood pressure 120/60, pulse 68, resp. rate 12, height '5\' 8"'  (1.727 m), weight 167 lb (75.8 kg).  Physical Exam Physical Exam  Constitutional: He is oriented to person, place, and  time. He appears well-developed and well-nourished.  Eyes: Conjunctivae are normal. No scleral icterus.  Neck: Neck supple.  Cardiovascular: Normal rate, regular rhythm and normal heart sounds.   Pulmonary/Chest: Effort normal and breath sounds normal.  Abdominal: Soft. Normal appearance and bowel sounds are normal. There is tenderness.  Lymphadenopathy:    He has no cervical adenopathy.  Neurological: He is alert and oriented to person, place, and time.  Skin: Skin is warm and dry.    Data Reviewed Ultrasound of the pelvis dated 07/31/2016 showed a moderate postvoid residual of 57 mL. No specific urinary bladder lesions noted. CBC obtained the [facility] admission shows his hemoglobin is stable at 11 with a white blood cell count of 7600. And we'll decrease in the MCV 91. Normal differential. Platelet count of 330,000. Comprehensive metabolic panel showed a stable creatinine of 1.29 with an estimated GFR 52. Random blood sugar 197. Normal electrodes, normal liver function studies. CEA is normal at 2.4.  Colonoscopy dated 12/10/2012 showed a 20 mm polyp in the proximal ascending colon. Sessile polyps in the rectum.  Pathology: Part A: PROXIMAL ASCENDING COLON POLYP HOT SNARE:  - VILLOUS ADENOMA, MULTIPLE FRAGMENTS.  - NEGATIVE FOR HIGH GRADE DYSPLASIA AND MALIGNANCY.  .  Impression  Previous sizable villous adenoma without atypia in the right colon.  Weight loss.  Early satiety.  Change in bowel habits.  Plan    Upper and lower endoscopy is been recommended for evaluation. Laboratory studies obtained today noted above.    Scheduled ct scan and upper endoscopy,colonoscopy.   Colonoscopy with possible biopsy/polypectomy prn: Information regarding the procedure, including its potential risks and complications (including but not limited to perforation of the bowel, which may require emergency surgery to repair, and bleeding) was verbally given to the patient. Educational information  regarding lower intestinal endoscopy was given to the patient. Written instructions for how to complete the bowel prep using Miralax were provided. The importance of drinking ample fluids to avoid dehydration as a result of the prep emphasized.   HPI, Physical Exam, Assessment and Plan have been scribed under the direction and in the presence of Terry Ard, MD.  Gaspar Cola, CMA  I have completed the exam and reviewed the above documentation for accuracy and completeness.  I agree with the above.  Haematologist has been used and any errors in dictation or transcription are unintentional.  Terry [name], M.D., F.A.C.S.   Terry [name] 08/07/2016, 7:09 PM  Patient has been scheduled for a CT abdomen/pelvis with contrast at [facility] for 08-15-16 at 10 am (arrive 9:45 am). Prep: NPO 4 hours prior and pick up prep kit. Patient verbalizes understanding.  Patient  has been scheduled for a colonoscopy on 09-19-16 at [facility]. Miralax prescription has been sent in to the patient's pharmacy today. It is okay for patient to continue an 81 mg aspirin once daily. Colonoscopy instructions have been reviewed with the patient. This patient is aware to call the office if they have further questions.   Dominga Ferry, CMA

## 2016-08-06 NOTE — Patient Instructions (Signed)
Colonoscopy, Adult A colonoscopy is an exam to look at the entire large intestine. During the exam, a lubricated, bendable tube is inserted into the anus and then passed into the rectum, colon, and other parts of the large intestine. A colonoscopy is often done as a part of normal colorectal screening or in response to certain symptoms, such as anemia, persistent diarrhea, abdominal pain, and blood in the stool. The exam can help screen for and diagnose medical problems, including:  Tumors.  Polyps.  Inflammation.  Areas of bleeding.  Tell a health care provider about:  Any allergies you have.  All medicines you are taking, including vitamins, herbs, eye drops, creams, and over-the-counter medicines.  Any problems you or family members have had with anesthetic medicines.  Any blood disorders you have.  Any surgeries you have had.  Any medical conditions you have.  Any problems you have had passing stool. What are the risks? Generally, this is a safe procedure. However, problems may occur, including:  Bleeding.  A tear in the intestine.  A reaction to medicines given during the exam.  Infection (rare).  What happens before the procedure? Eating and drinking restrictions Follow instructions from your health care provider about eating and drinking, which may include:  A few days before the procedure - follow a low-fiber diet. Avoid nuts, seeds, dried fruit, raw fruits, and vegetables.  1-3 days before the procedure - follow a clear liquid diet. Drink only clear liquids, such as clear broth or bouillon, black coffee or tea, clear juice, clear soft drinks or sports drinks, gelatin dessert, and popsicles. Avoid any liquids that contain red or purple dye.  On the day of the procedure - do not eat or drink anything during the 2 hours before the procedure, or within the time period that your health care provider recommends.  Bowel prep If you were prescribed an oral bowel prep  to clean out your colon:  Take it as told by your health care provider. Starting the day before your procedure, you will need to drink a large amount of medicated liquid. The liquid will cause you to have multiple loose stools until your stool is almost clear or light green.  If your skin or anus gets irritated from diarrhea, you may use these to relieve the irritation: ? Medicated wipes, such as adult wet wipes with aloe and vitamin E. ? A skin soothing-product like petroleum jelly.  If you vomit while drinking the bowel prep, take a break for up to 60 minutes and then begin the bowel prep again. If vomiting continues and you cannot take the bowel prep without vomiting, call your health care provider.  General instructions  Ask your health care provider about changing or stopping your regular medicines. This is especially important if you are taking diabetes medicines or blood thinners.  Plan to have someone take you home from the hospital or clinic. What happens during the procedure?  An IV tube may be inserted into one of your veins.  You will be given medicine to help you relax (sedative).  To reduce your risk of infection: ? Your health care team will wash or sanitize their hands. ? Your anal area will be washed with soap.  You will be asked to lie on your side with your knees bent.  Your health care provider will lubricate a long, thin, flexible tube. The tube will have a camera and a light on the end.  The tube will be inserted into your   anus.  The tube will be gently eased through your rectum and colon.  Air will be delivered into your colon to keep it open. You may feel some pressure or cramping.  The camera will be used to take images during the procedure.  A small tissue sample may be removed from your body to be examined under a microscope (biopsy). If any potential problems are found, the tissue will be sent to a lab for testing.  If small polyps are found, your  health care provider may remove them and have them checked for cancer cells.  The tube that was inserted into your anus will be slowly removed. The procedure may vary among health care providers and hospitals. What happens after the procedure?  Your blood pressure, heart rate, breathing rate, and blood oxygen level will be monitored until the medicines you were given have worn off.  Do not drive for 24 hours after the exam.  You may have a small amount of blood in your stool.  You may pass gas and have mild abdominal cramping or bloating due to the air that was used to inflate your colon during the exam.  It is up to you to get the results of your procedure. Ask your health care provider, or the department performing the procedure, when your results will be ready. This information is not intended to replace advice given to you by your health care provider. Make sure you discuss any questions you have with your health care provider. Document Released: 01/13/2000 Document Revised: 11/16/2015 Document Reviewed: 03/29/2015 Elsevier Interactive Patient Education  2018 [employer]vier [employer]  

## 2016-08-06 NOTE — Telephone Encounter (Signed)
[facility] would like to order labs for Pt and need Dr. [name] approval Contact [phone] *Pt is currently at office

## 2016-08-07 DIAGNOSIS — R110 Nausea: Secondary | ICD-10-CM | POA: Insufficient documentation

## 2016-08-07 DIAGNOSIS — Z86010 Personal history of colonic polyps: Secondary | ICD-10-CM | POA: Insufficient documentation

## 2016-08-07 LAB — TSH: TSH: 1.37 u[IU]/mL (ref 0.450–4.500)

## 2016-08-07 LAB — COMPREHENSIVE METABOLIC PANEL
ALK PHOS: 61 IU/L (ref 39–117)
ALT: 13 IU/L (ref 0–44)
AST: 17 IU/L (ref 0–40)
Albumin/Globulin Ratio: 1.8 (ref 1.2–2.2)
Albumin: 4.1 g/dL (ref 3.5–4.7)
BUN/Creatinine Ratio: 10 (ref 10–24)
BUN: 13 mg/dL (ref 8–27)
Bilirubin Total: 0.4 mg/dL (ref 0.0–1.2)
CALCIUM: 9.3 mg/dL (ref 8.6–10.2)
CO2: 25 mmol/L (ref 20–29)
CREATININE: 1.29 mg/dL — AB (ref 0.76–1.27)
Chloride: 101 mmol/L (ref 96–106)
GFR calc Af Amer: 60 mL/min/{1.73_m2} (ref >59)
GFR, EST NON AFRICAN AMERICAN: 52 mL/min/{1.73_m2} — AB (ref >59)
GLUCOSE: 197 mg/dL — AB (ref 65–99)
Globulin, Total: 2.3 g/dL (ref 1.5–4.5)
Potassium: 4.4 mmol/L (ref 3.5–5.2)
SODIUM: 137 mmol/L (ref 134–144)
Total Protein: 6.4 g/dL (ref 6.0–8.5)

## 2016-08-07 LAB — CBC WITH DIFFERENTIAL/PLATELET
BASOS: 0 %
Basophils Absolute: 0 10*3/uL (ref 0.0–0.2)
EOS (ABSOLUTE): 0.2 10*3/uL (ref 0.0–0.4)
EOS: 3 %
Hematocrit: 33.6 % — ABNORMAL LOW (ref 37.5–51.0)
Hemoglobin: 11 g/dL — ABNORMAL LOW (ref 13.0–17.7)
IMMATURE GRANS (ABS): 0 10*3/uL (ref 0.0–0.1)
IMMATURE GRANULOCYTES: 0 %
LYMPHS: 27 %
Lymphocytes Absolute: 2 10*3/uL (ref 0.7–3.1)
MCH: 29.8 pg (ref 26.6–33.0)
MCHC: 32.7 g/dL (ref 31.5–35.7)
MCV: 91 fL (ref 79–97)
MONOS ABS: 0.7 10*3/uL (ref 0.1–0.9)
Monocytes: 9 %
NEUTROS PCT: 61 %
Neutrophils Absolute: 4.6 10*3/uL (ref 1.4–7.0)
PLATELETS: 330 10*3/uL (ref 150–379)
RBC: 3.69 x10E6/uL — AB (ref 4.14–5.80)
RDW: 12.8 % (ref 12.3–15.4)
WBC: 7.6 10*3/uL (ref 3.4–10.8)

## 2016-08-07 LAB — CEA: CEA1: 2.4 ng/mL (ref 0.0–4.7)

## 2016-08-08 NOTE — Telephone Encounter (Signed)
Mailed unread message to patient.  

## 2016-08-10 ENCOUNTER — Other Ambulatory Visit: Payer: Self-pay | Admitting: Internal Medicine

## 2016-08-15 ENCOUNTER — Ambulatory Visit
Admission: RE | Admit: 2016-08-15 | Discharge: 2016-08-15 | Disposition: A | Discharge status: Home / Self Care | Payer: PPO | Source: Ambulatory Visit | Attending: General Surgery | Admitting: General Surgery

## 2016-08-15 DIAGNOSIS — R634 Abnormal weight loss: Secondary | ICD-10-CM | POA: Insufficient documentation

## 2016-08-15 DIAGNOSIS — I2510 Atherosclerotic heart disease of native coronary artery without angina pectoris: Secondary | ICD-10-CM | POA: Diagnosis not present

## 2016-08-15 DIAGNOSIS — N200 Calculus of kidney: Secondary | ICD-10-CM | POA: Diagnosis not present

## 2016-08-15 DIAGNOSIS — R109 Unspecified abdominal pain: Secondary | ICD-10-CM | POA: Diagnosis not present

## 2016-08-15 DIAGNOSIS — Z86010 Personal history of colonic polyps: Secondary | ICD-10-CM | POA: Insufficient documentation

## 2016-08-15 DIAGNOSIS — N400 Enlarged prostate without lower urinary tract symptoms: Secondary | ICD-10-CM | POA: Insufficient documentation

## 2016-08-15 DIAGNOSIS — K5730 Diverticulosis of large intestine without perforation or abscess without bleeding: Secondary | ICD-10-CM | POA: Insufficient documentation

## 2016-08-15 DIAGNOSIS — R1110 Vomiting, unspecified: Secondary | ICD-10-CM | POA: Diagnosis not present

## 2016-08-15 MED ORDER — IOPAMIDOL (ISOVUE-300) INJECTION 61%
85.0000 mL | Freq: Once | INTRAVENOUS | Status: AC | PRN
  Administered 2016-08-15: 85 mL via INTRAVENOUS

## 2016-08-16 ENCOUNTER — Telehealth: Payer: Self-pay

## 2016-08-16 NOTE — Telephone Encounter (Signed)
-----   Message from Robert Bellow, MD sent at 08/16/2016  7:30 AM EDT ----- Please notify the patient I reviewed his CT scan.  Nothing new or troubling. Will proceed with plans for upper and lower endoscopy. Thanks.  ----- Message ----- From: Interface, Rad Results In Sent: 08/15/2016   1:35 PM To: Robert Bellow, MD

## 2016-08-16 NOTE — Telephone Encounter (Signed)
Notified patient as instructed, patient pleased. Discussed follow-up appointments, patient agrees  

## 2016-08-29 ENCOUNTER — Other Ambulatory Visit: Payer: Self-pay | Admitting: Internal Medicine

## 2016-09-12 ENCOUNTER — Encounter: Payer: Self-pay | Admitting: *Deleted

## 2016-09-19 ENCOUNTER — Encounter
Admission: RE | Disposition: A | Discharge status: Home / Self Care | Payer: Self-pay | Source: Ambulatory Visit | Attending: General Surgery

## 2016-09-19 ENCOUNTER — Ambulatory Visit: Payer: PPO | Admitting: Anesthesiology

## 2016-09-19 ENCOUNTER — Encounter: Payer: Self-pay | Admitting: *Deleted

## 2016-09-19 ENCOUNTER — Ambulatory Visit
Admission: RE | Admit: 2016-09-19 | Discharge: 2016-09-19 | Disposition: A | Discharge status: Home / Self Care | Payer: PPO | Source: Ambulatory Visit | Attending: General Surgery | Admitting: General Surgery

## 2016-09-19 DIAGNOSIS — K5790 Diverticulosis of intestine, part unspecified, without perforation or abscess without bleeding: Secondary | ICD-10-CM | POA: Diagnosis not present

## 2016-09-19 DIAGNOSIS — J449 Chronic obstructive pulmonary disease, unspecified: Secondary | ICD-10-CM | POA: Diagnosis not present

## 2016-09-19 DIAGNOSIS — E0590 Thyrotoxicosis, unspecified without thyrotoxic crisis or storm: Secondary | ICD-10-CM | POA: Insufficient documentation

## 2016-09-19 DIAGNOSIS — R1032 Left lower quadrant pain: Secondary | ICD-10-CM | POA: Insufficient documentation

## 2016-09-19 DIAGNOSIS — K219 Gastro-esophageal reflux disease without esophagitis: Secondary | ICD-10-CM | POA: Diagnosis not present

## 2016-09-19 DIAGNOSIS — K2980 Duodenitis without bleeding: Secondary | ICD-10-CM | POA: Insufficient documentation

## 2016-09-19 DIAGNOSIS — E119 Type 2 diabetes mellitus without complications: Secondary | ICD-10-CM | POA: Diagnosis not present

## 2016-09-19 DIAGNOSIS — E785 Hyperlipidemia, unspecified: Secondary | ICD-10-CM | POA: Diagnosis not present

## 2016-09-19 DIAGNOSIS — Z85828 Personal history of other malignant neoplasm of skin: Secondary | ICD-10-CM | POA: Diagnosis not present

## 2016-09-19 DIAGNOSIS — K2990 Gastroduodenitis, unspecified, without bleeding: Secondary | ICD-10-CM | POA: Diagnosis not present

## 2016-09-19 DIAGNOSIS — Z7982 Long term (current) use of aspirin: Secondary | ICD-10-CM | POA: Diagnosis not present

## 2016-09-19 DIAGNOSIS — Z7984 Long term (current) use of oral hypoglycemic drugs: Secondary | ICD-10-CM | POA: Insufficient documentation

## 2016-09-19 DIAGNOSIS — Z87891 Personal history of nicotine dependence: Secondary | ICD-10-CM | POA: Diagnosis not present

## 2016-09-19 DIAGNOSIS — I10 Essential (primary) hypertension: Secondary | ICD-10-CM | POA: Diagnosis not present

## 2016-09-19 DIAGNOSIS — K5730 Diverticulosis of large intestine without perforation or abscess without bleeding: Secondary | ICD-10-CM | POA: Diagnosis not present

## 2016-09-19 DIAGNOSIS — R634 Abnormal weight loss: Secondary | ICD-10-CM

## 2016-09-19 DIAGNOSIS — R6881 Early satiety: Secondary | ICD-10-CM | POA: Insufficient documentation

## 2016-09-19 DIAGNOSIS — K2960 Other gastritis without bleeding: Secondary | ICD-10-CM | POA: Diagnosis not present

## 2016-09-19 DIAGNOSIS — K3189 Other diseases of stomach and duodenum: Secondary | ICD-10-CM | POA: Diagnosis not present

## 2016-09-19 DIAGNOSIS — Z79899 Other long term (current) drug therapy: Secondary | ICD-10-CM | POA: Insufficient documentation

## 2016-09-19 HISTORY — PX: ESOPHAGOGASTRODUODENOSCOPY (EGD) WITH PROPOFOL: SHX5813

## 2016-09-19 HISTORY — PX: COLONOSCOPY WITH PROPOFOL: SHX5780

## 2016-09-19 HISTORY — DX: Gastro-esophageal reflux disease without esophagitis: K21.9

## 2016-09-19 HISTORY — DX: Chronic obstructive pulmonary disease, unspecified: J44.9

## 2016-09-19 LAB — GLUCOSE, CAPILLARY: Glucose-Capillary: 143 mg/dL — ABNORMAL HIGH (ref 65–99)

## 2016-09-19 SURGERY — COLONOSCOPY WITH PROPOFOL
Anesthesia: General

## 2016-09-19 MED ORDER — GLYCOPYRROLATE 0.2 MG/ML IJ SOLN
INTRAMUSCULAR | Status: DC | PRN
  Administered 2016-09-19: 0.2 mg via INTRAVENOUS

## 2016-09-19 MED ORDER — LIDOCAINE HCL (CARDIAC) 20 MG/ML IV SOLN
INTRAVENOUS | Status: DC | PRN
  Administered 2016-09-19: 100 mg via INTRAVENOUS

## 2016-09-19 MED ORDER — PROPOFOL 500 MG/50ML IV EMUL
INTRAVENOUS | Status: DC | PRN
  Administered 2016-09-19: 150 ug/kg/min via INTRAVENOUS

## 2016-09-19 MED ORDER — PROPOFOL 500 MG/50ML IV EMUL
INTRAVENOUS | Status: AC
  Filled 2016-09-19: qty 50

## 2016-09-19 MED ORDER — SODIUM CHLORIDE 0.9 % IV SOLN
INTRAVENOUS | Status: DC
  Administered 2016-09-19: 08:00:00 via INTRAVENOUS

## 2016-09-19 MED ORDER — GLYCOPYRROLATE 0.2 MG/ML IJ SOLN
INTRAMUSCULAR | Status: AC
  Filled 2016-09-19: qty 1

## 2016-09-19 MED ORDER — PHENYLEPHRINE HCL 10 MG/ML IJ SOLN
INTRAMUSCULAR | Status: DC | PRN
  Administered 2016-09-19 (×2): 200 ug via INTRAVENOUS

## 2016-09-19 MED ORDER — LIDOCAINE HCL (PF) 2 % IJ SOLN
INTRAMUSCULAR | Status: AC
  Filled 2016-09-19: qty 2

## 2016-09-19 MED ORDER — PROPOFOL 10 MG/ML IV BOLUS
INTRAVENOUS | Status: DC | PRN
  Administered 2016-09-19: 80 mg via INTRAVENOUS

## 2016-09-19 NOTE — Transfer of Care (Signed)
Immediate Anesthesia Transfer of Care Note  Patient: XZAVION DOSWELL  Procedure(s) Performed: Procedure(s): COLONOSCOPY WITH PROPOFOL (N/A) ESOPHAGOGASTRODUODENOSCOPY (EGD) WITH PROPOFOL (N/A)  Patient Location: Endoscopy Unit  Anesthesia Type:General  Level of Consciousness: sedated  Airway & Oxygen Therapy: Patient Spontanous Breathing and Patient connected to nasal cannula oxygen  Post-op Assessment: Report given to RN and Post -op Vital signs reviewed and stable  Post vital signs: Reviewed and stable  Last Vitals:  Vitals:   09/19/16 0749 09/19/16 0900  BP: (!) 155/60 (!) 89/57  Pulse: (!) 119 (!) 103  Resp: 20 (!) 23  Temp: (!) 36.3 C (!) 36.4 C  SpO2: 98% 97%    Last Pain:  Vitals:   09/19/16 0749  TempSrc: Tympanic  PainSc: 5       Patients Stated Pain Goal: 5 (03/29/29 4388)  Complications: No apparent anesthesia complications

## 2016-09-19 NOTE — Anesthesia Post-op Follow-up Note (Signed)
Anesthesia QCDR form completed.        

## 2016-09-19 NOTE — Anesthesia Postprocedure Evaluation (Signed)
Anesthesia Post Note  Patient: JOURDEN GILSON  Procedure(s) Performed: Procedure(s) (LRB): COLONOSCOPY WITH PROPOFOL (N/A) ESOPHAGOGASTRODUODENOSCOPY (EGD) WITH PROPOFOL (N/A)  Patient location during evaluation: Endoscopy Anesthesia Type: General Level of consciousness: awake and alert Pain management: pain level controlled Vital Signs Assessment: post-procedure vital signs reviewed and stable Respiratory status: spontaneous breathing, nonlabored ventilation, respiratory function stable and patient connected to nasal cannula oxygen Cardiovascular status: blood pressure returned to baseline and stable Postop Assessment: no signs of nausea or vomiting Anesthetic complications: no     Last Vitals:  Vitals:   09/19/16 0920 09/19/16 0930  BP: 126/75 131/81  Pulse: 100 96  Resp: (!) 24 (!) 24  Temp:    SpO2: 99% 95%    Last Pain:  Vitals:   09/19/16 0749  TempSrc: Tympanic  PainSc: 5                  Martha Clan

## 2016-09-19 NOTE — Op Note (Signed)
[facility] Gastroenterology Patient Name: Terry [name] Procedure Date: 09/19/2016 8:08 AM MRN: [id number] Account #: [id number] Date of Birth: 1933/03/23 Admit Type: Outpatient Age: 83 Room: [facility] ENDO ROOM 1 Gender: Male Note Status: Finalized Procedure:            Colonoscopy Indications:          Abdominal pain in the left lower quadrant Providers:            Robert Bellow, MD Referring MD:         Deborra Medina, MD (Referring MD) Medicines:            Monitored Anesthesia Care Complications:        No immediate complications. Procedure:            Pre-Anesthesia Assessment:                       - Prior to the procedure, a History and Physical was                        performed, and patient medications, allergies and                        sensitivities were reviewed. The patient's tolerance of                        previous anesthesia was reviewed.                       - The risks and benefits of the procedure and the                        sedation options and risks were discussed with the                        patient. All questions were answered and informed                        consent was obtained.                       - The risks and benefits of the procedure and the                        sedation options and risks were discussed with the                        patient. All questions were answered and informed                        consent was obtained.                       After obtaining informed consent, the colonoscope was                        passed under direct vision. Throughout the procedure,                        the patient's blood pressure, pulse, and oxygen  saturations were monitored continuously. The                        Colonoscope was introduced through the anus and                        advanced to the the cecum, identified by appendiceal                        orifice and ileocecal valve. The  colonoscopy was                        somewhat difficult due to inadequate bowel prep. The                        patient tolerated the procedure well. The quality of                        the bowel preparation was adequate to identify polyps 6                        mm and larger in size. Findings:      Multiple large-mouthed diverticula were found in the recto-sigmoid       colon, sigmoid colon and ascending colon. There was no evidence of       diverticular bleeding.      The retroflexed view of the distal rectum and anal verge was normal and       showed no anal or rectal abnormalities. Impression:           - Moderate diverticulosis in the recto-sigmoid colon,                        in the sigmoid colon and in the ascending colon. There                        was no evidence of diverticular bleeding.                       - The distal rectum and anal verge are normal on                        retroflexion view.                       - No specimens collected. Recommendation:       - Telephone endoscopist for pathology results in 1 week. Robert Bellow, MD 09/19/2016 9:00:23 AM This report has been signed electronically. Number of Addenda: 0 Note Initiated On: 09/19/2016 8:08 AM Scope Withdrawal Time: 0 hours 5 minutes 48 seconds  Total Procedure Duration: 0 hours 15 minutes 9 seconds       [facility]

## 2016-09-19 NOTE — H&P (Signed)
Terry [name] [id number] 08/06/1933     HPI:  Patient with lower abdominal pain and weight loss. Early satiety. CT last month showed long standing diverticulosis without evidence of diverticulitis.  Tolerated prep well.  For upper and lower endoscopy.   Facility-Administered Medications Prior to Admission  Medication Dose Route Frequency Provider Last Rate Last Dose  . ipratropium-albuterol (DUONEB) 0.5-2.5 (3) MG/3ML nebulizer solution 3 mL  3 mL Nebulization Q6H Burnard Hawthorne, FNP       Prescriptions Prior to Admission  Medication Sig Dispense Refill Last Dose  . COMBIVENT RESPIMAT 20-100 MCG/ACT AERS respimat Use as directed 1 puff in the mouth or throat 4 (four) times daily as needed.  2 Past Month at Unknown time  . aspirin EC 81 MG tablet Take 1 tablet (81 mg total) by mouth daily. 90 tablet 3 09/17/2016  . b complex vitamins tablet Take 1 tablet by mouth daily.   09/17/2016  . benazepril (LOTENSIN) 40 MG tablet TAKE 1 TABLET (40 MG TOTAL) BY MOUTH DAILY. 90 tablet 1 09/17/2016  . Cholecalciferol (VITAMIN D3) 1000 UNITS CAPS Take by mouth.   09/17/2016  . cyanocobalamin 1000 MCG tablet Take 1,000 mcg by mouth daily.   09/17/2016  . doxycycline (VIBRA-TABS) 100 MG tablet Take 1 tablet (100 mg total) by mouth 2 (two) times daily. (Patient not taking: Reported on 09/19/2016) 14 tablet 0 Not Taking at Unknown time  . metFORMIN (GLUCOPHAGE-XR) 500 MG 24 hr tablet Take 500 mg by mouth daily.   09/17/2016  . methimazole (TAPAZOLE) 5 MG tablet Take 2.5 mg by mouth daily.   Taking  . metoprolol succinate (TOPROL-XL) 50 MG 24 hr tablet TAKE 1 TABLET (50 MG TOTAL) BY MOUTH DAILY. 90 tablet 1 09/17/2016  . omeprazole (PRILOSEC) 40 MG capsule TAKE ONE CAPSULE BY MOUTH EVERY DAY 30 capsule 3 09/17/2016  . polyethylene glycol powder (GLYCOLAX/MIRALAX) powder 255 grams one bottle for colonoscopy prep 255 g 0   . simvastatin (ZOCOR) 40 MG tablet TAKE 1 TABLET BY MOUTH AT BEDTIME 30 tablet 3 09/17/2016  .  tamsulosin (FLOMAX) 0.4 MG CAPS capsule TAKE 1 CAPSULE (0.4 MG TOTAL) BY MOUTH DAILY. 30 capsule 3 09/17/2016  . Tiotropium Bromide Monohydrate (SPIRIVA RESPIMAT IN) Inhale 1 spray into the lungs daily.   Not Taking at Unknown time   No Known Allergies Past Medical History:  Diagnosis Date  . Anemia   . COPD (chronic obstructive pulmonary disease) ([place])   . Diabetes mellitus 2008  . GERD (gastroesophageal reflux disease)   . Hyperlipidemia   . Hypertension   . hyperthyroidism   . Squamous cell carcinoma of skin of left upper arm November 2015   Excised to negative margins, associated keratoacanthoma.   Past Surgical History:  Procedure Laterality Date  . APPENDECTOMY  1970  . ARM SKIN LESION BIOPSY / EXCISION Left 2016  . CATARACT EXTRACTION, BILATERAL  2012   [facility]  . COLONOSCOPY  12-10-12  . COLONOSCOPY W/ POLYPECTOMY  2011   Dr. Raina Mina   Social History   Social History  . Marital status: Married    Spouse name: N/A  . Number of children: N/A  . Years of education: N/A   Occupational History  . Not on file.   Social History Main Topics  . Smoking status: Former Smoker    Packs/day: 2.00    Years: 50.00    Types: Cigarettes    Quit date: 06/26/1999  . Smokeless tobacco: Never Used  . Alcohol  use No  . Drug use: No  . Sexual activity: Yes   Other Topics Concern  . Not on file   Social History Narrative  . No narrative on file   Social History   Social History Narrative  . No narrative on file     ROS: Negative.     PE: HEENT: Negative. Lungs: Clear. Cardio: RR. Assessment/Plan:  Proceed with planned upper and lower endoscopy.   Robert Bellow 09/19/2016   Assessment/Plan:  Proceed with planned endoscopy.

## 2016-09-19 NOTE — Anesthesia Preprocedure Evaluation (Signed)
Anesthesia Evaluation  Patient identified by MRN, date of birth, ID band Patient awake    Reviewed: Allergy & Precautions, H&P , NPO status , Patient's Chart, lab work & pertinent test results, reviewed documented beta blocker date and time   History of Anesthesia Complications Negative for: history of anesthetic complications  Airway Mallampati: I  TM Distance: >3 FB Neck ROM: full    Dental  (+) Edentulous Upper, Edentulous Lower   Pulmonary shortness of breath and with exertion, neg sleep apnea, COPD,  COPD inhaler, neg recent URI, former smoker,           Cardiovascular Exercise Tolerance: Good hypertension, (-) angina+ Peripheral Vascular Disease  (-) CAD, (-) Past MI, (-) Cardiac Stents and (-) CABG (-) dysrhythmias (-) Valvular Problems/Murmurs     Neuro/Psych negative neurological ROS  negative psych ROS   GI/Hepatic Neg liver ROS, GERD  ,  Endo/Other  diabetes, Type 2, Oral Hypoglycemic AgentsHyperthyroidism   Renal/GU negative Renal ROS  negative genitourinary   Musculoskeletal   Abdominal   Peds  Hematology negative hematology ROS (+)   Anesthesia Other Findings Past Medical History: No date: Anemia No date: COPD (chronic obstructive pulmonary disease) ([place]) 2008: Diabetes mellitus No date: GERD (gastroesophageal reflux disease) No date: Hyperlipidemia No date: Hypertension No date: hyperthyroidism November 2015: Squamous cell carcinoma of skin of left upper arm     Comment:  Excised to negative margins, associated keratoacanthoma.   Reproductive/Obstetrics negative OB ROS                             Anesthesia Physical Anesthesia Plan  ASA: III  Anesthesia Plan: General   Post-op Pain Management:    Induction: Intravenous  PONV Risk Score and Plan: 2 and Propofol infusion  Airway Management Planned: Natural Airway and Nasal Cannula  Additional Equipment:    Intra-op Plan:   Post-operative Plan:   Informed Consent: I have reviewed the patients History and Physical, chart, labs and discussed the procedure including the risks, benefits and alternatives for the proposed anesthesia with the patient or authorized representative who has indicated his/her understanding and acceptance.   Dental Advisory Given  Plan Discussed with: Anesthesiologist, CRNA and Surgeon  Anesthesia Plan Comments:         Anesthesia Quick Evaluation

## 2016-09-19 NOTE — Op Note (Signed)
[facility] Gastroenterology Patient Name: Terry [name] Procedure Date: 09/19/2016 8:08 AM MRN: [id number] Account #: [id number] Date of Birth: 1933-10-05 Admit Type: Outpatient Age: 83 Room: [facility] ENDO ROOM 1 Gender: Male Note Status: Finalized Procedure:            Upper GI endoscopy Indications:          Early satiety, Weight loss Providers:            Robert Bellow, MD Referring MD:         Deborra Medina, MD (Referring MD) Medicines:            Monitored Anesthesia Care Complications:        No immediate complications. Procedure:            Pre-Anesthesia Assessment:                       - Prior to the procedure, a History and Physical was                        performed, and patient medications, allergies and                        sensitivities were reviewed. The patient's tolerance of                        previous anesthesia was reviewed.                       - The risks and benefits of the procedure and the                        sedation options and risks were discussed with the                        patient. All questions were answered and informed                        consent was obtained.                       After obtaining informed consent, the endoscope was                        passed under direct vision. Throughout the procedure,                        the patient's blood pressure, pulse, and oxygen                        saturations were monitored continuously. The Endoscope                        was introduced through the mouth, and advanced to the                        second part of duodenum. The upper GI endoscopy was                        accomplished without difficulty. The patient tolerated  the procedure well. Findings:      The esophagus was normal. Biopsies were taken with a cold forceps for       histology.      The nasopharynx was normal.      Diffuse moderately erythematous mucosa without  active bleeding and with       no stigmata of bleeding was found in the duodenal bulb. Biopsies were       taken with a cold forceps for histology. Impression:           - Normal esophagus. Biopsied.                       - Normal nasopharynx.                       - Erythematous duodenopathy. Recommendation:       - Perform a colonoscopy today. Procedure Code(s):    --- Professional ---                       [phone], Esophagogastroduodenoscopy, flexible, transoral;                        with biopsy, single or multiple Diagnosis Code(s):    --- Professional ---                       K31.89, Other diseases of stomach and duodenum                       R68.81, Early satiety                       R63.4, Abnormal weight loss CPT copyright 2016 American Medical Association. All rights reserved. The codes documented in this report are preliminary and upon coder review may  be revised to meet current compliance requirements. Robert Bellow, MD 09/19/2016 8:40:31 AM This report has been signed electronically. Number of Addenda: 0 Note Initiated On: 09/19/2016 8:08 AM      [facility]

## 2016-09-20 ENCOUNTER — Encounter: Payer: Self-pay | Admitting: General Surgery

## 2016-09-20 LAB — SURGICAL PATHOLOGY

## 2016-09-24 ENCOUNTER — Telehealth: Payer: Self-pay

## 2016-09-24 NOTE — Telephone Encounter (Signed)
Spoke with patient's wife and informed of biopsy results. Also advised on taking the Prilosec twice daily. The patient will call back to schedule a follow up with Dr Bary Castilla in 3-4 weeks.

## 2016-09-24 NOTE — Telephone Encounter (Signed)
-----   Message from Robert Bellow, MD sent at 09/24/2016 12:50 PM EDT -----  Please notify the patient that the biopsies did not show any cancer or infection. He needs to increase his Prilosec to 40 mg tablet twice a day. Follow-up exam in 3-4 weeks. Thank you ----- Message ----- From: Interface, Lab In Three Zero One Sent: 09/20/2016   4:03 PM To: Robert Bellow, MD

## 2016-09-28 ENCOUNTER — Encounter: Payer: Self-pay | Admitting: Internal Medicine

## 2016-09-28 MED ORDER — OMEPRAZOLE 40 MG PO CPDR: 40.0000 mg | DELAYED_RELEASE_CAPSULE | Freq: Every day | ORAL | 3 refills | Status: DC

## 2016-10-04 NOTE — Telephone Encounter (Signed)
Left pt message asking to call Allison back directly at [phone] to schedule AWV. Thanks! °  °*NOTE* No hx of AWV °

## 2016-10-08 ENCOUNTER — Ambulatory Visit: Payer: PPO | Admitting: Podiatry

## 2016-10-17 ENCOUNTER — Ambulatory Visit (INDEPENDENT_AMBULATORY_CARE_PROVIDER_SITE_OTHER): Payer: PPO | Admitting: General Surgery

## 2016-10-17 ENCOUNTER — Encounter: Payer: Self-pay | Admitting: General Surgery

## 2016-10-17 VITALS — BP 122/68 | HR 68 | Resp 12 | Ht 68.0 in | Wt 162.0 lb

## 2016-10-17 DIAGNOSIS — R1084 Generalized abdominal pain: Secondary | ICD-10-CM | POA: Diagnosis not present

## 2016-10-17 DIAGNOSIS — R634 Abnormal weight loss: Secondary | ICD-10-CM

## 2016-10-17 NOTE — Progress Notes (Signed)
Patient ID: Terry [name], male   DOB: 1933-04-23, 83 y.o.   MRN: [id number]  Chief Complaint  Patient presents with  . Follow-up    HPI Terry [name] is a 83 y.o. male here today for his follow up upper and lower endoscopy done on 09/21/2016.Patient states she feel bad when he works and sometime feel dizzy. He states he is having pain in his right upper quadrant and been going on for a couple of weeks.  The pain comes and goes. No change in his morning rotate but in the afternoon his feels weak and tried.  The patient's early satiety reported prior to his endoscopy has resolved. No reflux symptoms.  Patient had stop drinking wine in May of this year. He was drinking two bottles a night.  Wife, Rise Paganini is present at visit.   HPI  Past Medical History:  Diagnosis Date  . Anemia   . COPD (chronic obstructive pulmonary disease) ([place])   . Diabetes mellitus 2008  . GERD (gastroesophageal reflux disease)   . Hyperlipidemia   . Hypertension   . hyperthyroidism   . Squamous cell carcinoma of skin of left upper arm November 2015   Excised to negative margins, associated keratoacanthoma.    Past Surgical History:  Procedure Laterality Date  . APPENDECTOMY  1970  . ARM SKIN LESION BIOPSY / EXCISION Left 2016  . CATARACT EXTRACTION, BILATERAL  2012   Porfilio  . COLONOSCOPY  12-10-12  . COLONOSCOPY W/ POLYPECTOMY  2011   Dr. Raina Mina  . COLONOSCOPY WITH PROPOFOL N/A 09/19/2016   Procedure: COLONOSCOPY WITH PROPOFOL;  Surgeon: Robert Bellow, MD;  Location: [facility] ENDOSCOPY;  Service: Endoscopy;  Laterality: N/A;  . ESOPHAGOGASTRODUODENOSCOPY (EGD) WITH PROPOFOL N/A 09/19/2016   Procedure: ESOPHAGOGASTRODUODENOSCOPY (EGD) WITH PROPOFOL;  Surgeon: Robert Bellow, MD;  Location: [facility] ENDOSCOPY;  Service: Endoscopy;  Laterality: N/A;    Family History  Problem Relation Age of Onset  . Hypertension Father   . Diabetes Maternal Aunt   . Hyperlipidemia Maternal Uncle   . Birth  defects Neg Hx     Social History Social History  Substance Use Topics  . Smoking status: Former Smoker    Packs/day: 2.00    Years: 50.00    Types: Cigarettes    Quit date: 06/26/1999  . Smokeless tobacco: Never Used  . Alcohol use No    No Known Allergies  Current Outpatient Prescriptions  Medication Sig Dispense Refill  . aspirin EC 81 MG tablet Take 1 tablet (81 mg total) by mouth daily. 90 tablet 3  . b complex vitamins tablet Take 1 tablet by mouth daily.    . benazepril (LOTENSIN) 40 MG tablet TAKE 1 TABLET (40 MG TOTAL) BY MOUTH DAILY. 90 tablet 1  . Cholecalciferol (VITAMIN D3) 1000 UNITS CAPS Take by mouth.    . COMBIVENT RESPIMAT 20-100 MCG/ACT AERS respimat Use as directed 1 puff in the mouth or throat 4 (four) times daily as needed.  2  . cyanocobalamin 1000 MCG tablet Take 1,000 mcg by mouth daily.    Marland Kitchen doxycycline (VIBRA-TABS) 100 MG tablet Take 1 tablet (100 mg total) by mouth 2 (two) times daily. 14 tablet 0  . metFORMIN (GLUCOPHAGE-XR) 500 MG 24 hr tablet Take 500 mg by mouth daily.    . methimazole (TAPAZOLE) 5 MG tablet Take 2.5 mg by mouth daily.    . metoprolol succinate (TOPROL-XL) 50 MG 24 hr tablet TAKE 1 TABLET (50 MG TOTAL) BY MOUTH DAILY.  90 tablet 1  . omeprazole (PRILOSEC) 40 MG capsule Take 1 capsule (40 mg total) by mouth daily. 30 capsule 3  . simvastatin (ZOCOR) 40 MG tablet TAKE 1 TABLET BY MOUTH AT BEDTIME 30 tablet 3  . tamsulosin (FLOMAX) 0.4 MG CAPS capsule TAKE 1 CAPSULE (0.4 MG TOTAL) BY MOUTH DAILY. 30 capsule 3  . Tiotropium Bromide Monohydrate (SPIRIVA RESPIMAT IN) Inhale 1 spray into the lungs daily.     Current Facility-Administered Medications  Medication Dose Route Frequency Provider Last Rate Last Dose  . ipratropium-albuterol (DUONEB) 0.5-2.5 (3) MG/3ML nebulizer solution 3 mL  3 mL Nebulization Q6H Burnard Hawthorne, FNP        Review of Systems Review of Systems  Constitutional: Positive for fatigue.  Respiratory: Negative.    Cardiovascular: Negative.   Gastrointestinal: Positive for abdominal pain.    Blood pressure 122/68, pulse 68, resp. rate 12, height 5\' 8"  (1.727 m), weight 162 lb (73.5 kg).  The patient's weight is down 5 pounds from his 08/06/2016 exam.  Physical Exam Physical Exam  Constitutional: He is oriented to person, place, and time. He appears well-developed and well-nourished.  Cardiovascular: Normal rate, regular rhythm and normal heart sounds.   Pulmonary/Chest: Effort normal and breath sounds normal.  Abdominal: Soft. Normal appearance and bowel sounds are normal. There is no tenderness. A hernia (5 mm umbilical hernia) is present.  Neurological: He is alert and oriented to person, place, and time.  Skin: Skin is warm and dry.    Data Reviewed The patient's alcohol consumption may have accounted for over 1300 cal per day, and the cessation of alcohol may be the sole reason for his weight loss.  CT of the abdomen and pelvis completed 08/15/2016 showed no evidence of malignancy. IMPRESSION: 1. No acute abnormality. 2. Small, nonobstructing bilateral renal calculi. 3. Stable diffuse bladder wall thickening. This is compatible with chronic bladder outlet obstruction by the moderately enlarged prostate gland. 4. Extensive colonic diverticulosis without evidence diverticulitis. 5. Calcified coronary artery atherosclerosis.  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  Assessment    Weight loss likely secondary to loss of caloric intake associated with alcohol.  Resolution of postprandial fullness.  Right upper quadrant pain, possibly related to gallstones too small to detect on CT.    Plan            Patient need to ingest 500 hundred more calories daily. This can be boost, ensure, Slim fast or simple  dietary supplements.  I doubt were missing an occult malignancy, but we'll go ahead and complete the evaluation with a chest x-ray and ultrasound.    HPI, Physical Exam, Assessment and Plan have been scribed under the direction and in the presence of Hervey Ard, MD.  Gaspar Cola, CMA  I have completed the exam and reviewed the above documentation for accuracy and completeness.  I agree with the above.  Haematologist has been used and any errors in dictation or transcription are unintentional.  Hervey Ard, M.D., F.A.C.S.   Robert Bellow 10/19/2016, 8:59 PM  Patient has been scheduled for an abdominal ultrasound complete at [facility] for 10-23-16 at 8 am (arrive 7:45 am). Prep: NPO after midnight.   The patient will have a chest x-ray 2 view completed at the time  of ultrasound and aware to notify the [facility] staff when he checks in for ultrasound. Order in [place].   Dominga Ferry, CMA

## 2016-10-17 NOTE — Patient Instructions (Addendum)
Patient to have a chest x-ray and ultrasound. Patient need to eat 500 hundred more calories daily.

## 2016-10-23 ENCOUNTER — Ambulatory Visit
Admission: RE | Admit: 2016-10-23 | Discharge: 2016-10-23 | Disposition: A | Discharge status: Home / Self Care | Payer: PPO | Source: Ambulatory Visit | Attending: General Surgery | Admitting: General Surgery

## 2016-10-23 DIAGNOSIS — R918 Other nonspecific abnormal finding of lung field: Secondary | ICD-10-CM | POA: Insufficient documentation

## 2016-10-23 DIAGNOSIS — R634 Abnormal weight loss: Secondary | ICD-10-CM

## 2016-10-23 DIAGNOSIS — Z6824 Body mass index (BMI) 24.0-24.9, adult: Secondary | ICD-10-CM | POA: Insufficient documentation

## 2016-10-23 DIAGNOSIS — R079 Chest pain, unspecified: Secondary | ICD-10-CM | POA: Diagnosis not present

## 2016-10-23 DIAGNOSIS — R1084 Generalized abdominal pain: Secondary | ICD-10-CM

## 2016-10-24 ENCOUNTER — Telehealth: Payer: Self-pay

## 2016-10-24 NOTE — Telephone Encounter (Signed)
Error

## 2016-10-24 NOTE — Telephone Encounter (Signed)
-----   Message from Robert Bellow, MD sent at 10/23/2016  9:34 AM EDT -----  Please notify the patient that the chest x-ray and abdominal ultrasound were fine. He needs to continue to work on getting enough calories in. Arrange for a weight check either here or with Dr. [name] in one month. Thank you ----- Message ----- From: Interface, Rad Results In Sent: 10/23/2016   8:42 AM To: Robert Bellow, MD

## 2016-10-24 NOTE — Telephone Encounter (Signed)
Notified patient as instructed, patient pleased. Discussed follow-up appointments, patient agrees. Will come in for a weight check next month.

## 2016-10-25 NOTE — Telephone Encounter (Signed)
Orders

## 2016-10-31 NOTE — Telephone Encounter (Signed)
Scheduled appt with pt wife on 11/06/16

## 2016-11-06 ENCOUNTER — Ambulatory Visit (INDEPENDENT_AMBULATORY_CARE_PROVIDER_SITE_OTHER): Payer: PPO

## 2016-11-06 VITALS — BP 118/62 | HR 60 | Temp 98.5°F | Resp 14 | Wt 164.0 lb

## 2016-11-06 DIAGNOSIS — Z23 Encounter for immunization: Secondary | ICD-10-CM | POA: Diagnosis not present

## 2016-11-06 DIAGNOSIS — Z1331 Encounter for screening for depression: Secondary | ICD-10-CM

## 2016-11-06 DIAGNOSIS — Z0000 Encounter for general adult medical examination without abnormal findings: Secondary | ICD-10-CM

## 2016-11-06 NOTE — Patient Instructions (Addendum)
  Mr. Terry [name] , Thank you for taking time to come for your Medicare Wellness Visit. I appreciate your ongoing commitment to your health goals. Please review the following plan we discussed and let me know if I can assist you in the future.   Follow up with Dr. [name] as needed.    Bring a copy of your [place] and/or Living Will to be scanned into chart.  Have a great day!  These are the goals we discussed: Goals    . Healthy Lifetsyle          Stay hydrated, increase water intake Stay active Low carb foods       This is a list of the screening recommended for you and due dates:  Health Maintenance  Topic Date Due  . Flu Shot  08/29/2016  . Hemoglobin A1C  09/09/2016  . Complete foot exam   03/12/2017  . Eye exam for diabetics  05/25/2017  . Tetanus Vaccine  02/26/2023  . Pneumonia vaccines  Completed

## 2016-11-06 NOTE — Progress Notes (Signed)
Subjective:   Terry [name] is a 83 y.o. male who presents for an Initial Medicare Annual Wellness Visit.  Review of Systems  No ROS.  Medicare Wellness Visit. Additional risk factors are reflected in the social history.  Cardiac Risk Factors include: male gender;advanced age (>23men, >67 women);hypertension;diabetes mellitus    Objective:    Today's Vitals   11/06/16 1542  BP: 118/62  Pulse: 60  Resp: 14  Temp: 98.5 F (36.9 C)  TempSrc: Oral  SpO2: 92%  Weight: 164 lb (74.4 kg)   Body mass index is 24.94 kg/m.  Current Medications (verified) Outpatient Encounter Prescriptions as of 11/06/2016  Medication Sig  . aspirin EC 81 MG tablet Take 1 tablet (81 mg total) by mouth daily.  Marland Kitchen b complex vitamins tablet Take 1 tablet by mouth daily.  . benazepril (LOTENSIN) 40 MG tablet TAKE 1 TABLET (40 MG TOTAL) BY MOUTH DAILY.  Marland Kitchen Cholecalciferol (VITAMIN D3) 1000 UNITS CAPS Take by mouth.  . COMBIVENT RESPIMAT 20-100 MCG/ACT AERS respimat Use as directed 1 puff in the mouth or throat 4 (four) times daily as needed.  . cyanocobalamin 1000 MCG tablet Take 1,000 mcg by mouth daily.  Marland Kitchen doxycycline (VIBRA-TABS) 100 MG tablet Take 1 tablet (100 mg total) by mouth 2 (two) times daily.  . metFORMIN (GLUCOPHAGE-XR) 500 MG 24 hr tablet Take 500 mg by mouth daily.  . methimazole (TAPAZOLE) 5 MG tablet Take 2.5 mg by mouth daily.  . metoprolol succinate (TOPROL-XL) 50 MG 24 hr tablet TAKE 1 TABLET (50 MG TOTAL) BY MOUTH DAILY.  Marland Kitchen omeprazole (PRILOSEC) 40 MG capsule Take 1 capsule (40 mg total) by mouth daily.  . simvastatin (ZOCOR) 40 MG tablet TAKE 1 TABLET BY MOUTH AT BEDTIME  . tamsulosin (FLOMAX) 0.4 MG CAPS capsule TAKE 1 CAPSULE (0.4 MG TOTAL) BY MOUTH DAILY.  Marland Kitchen Tiotropium Bromide Monohydrate (SPIRIVA RESPIMAT IN) Inhale 1 spray into the lungs daily.   Facility-Administered Encounter Medications as of 11/06/2016  Medication  . ipratropium-albuterol (DUONEB) 0.5-2.5 (3) MG/3ML  nebulizer solution 3 mL    Allergies (verified) Patient has no known allergies.   History: Past Medical History:  Diagnosis Date  . Anemia   . COPD (chronic obstructive pulmonary disease) ([place])   . Diabetes mellitus 2008  . GERD (gastroesophageal reflux disease)   . Hyperlipidemia   . Hypertension   . hyperthyroidism   . Squamous cell carcinoma of skin of left upper arm November 2015   Excised to negative margins, associated keratoacanthoma.   Past Surgical History:  Procedure Laterality Date  . APPENDECTOMY  1970  . ARM SKIN LESION BIOPSY / EXCISION Left 2016  . CATARACT EXTRACTION, BILATERAL  2012   Porfilio  . COLONOSCOPY  12-10-12  . COLONOSCOPY W/ POLYPECTOMY  2011   Dr. Raina Mina  . COLONOSCOPY WITH PROPOFOL N/A 09/19/2016   Procedure: COLONOSCOPY WITH PROPOFOL;  Surgeon: Robert Bellow, MD;  Location[facility] ENDOSCOPY;  Service: Endoscopy;  Laterality: N/A;  . ESOPHAGOGASTRODUODENOSCOPY (EGD) WITH PROPOFOL N/A 09/19/2016   Procedure: ESOPHAGOGASTRODUODENOSCOPY (EGD) WITH PROPOFOL;  Surgeon: Robert Bellow, MD;  Location: [facility] ENDOSCOPY;  Service: Endoscopy;  Laterality: N/A;   Family History  Problem Relation Age of Onset  . Hypertension Father   . Diabetes Maternal Aunt   . Hyperlipidemia Maternal Uncle   . Birth defects Neg Hx    Social History   Occupational History  . Not on file.   Social History Main Topics  . Smoking status: Former  Smoker    Packs/day: 2.00    Years: 50.00    Types: Cigarettes    Quit date: 06/26/1999  . Smokeless tobacco: Never Used  . Alcohol use 15.0 oz/week    25 Glasses of wine per week  . Drug use: No  . Sexual activity: Yes   Tobacco Counseling Counseling given: Not Answered   Activities of Daily Living In your present state of health, do you have any difficulty performing the following activities: 11/06/2016  Hearing? N  Vision? N  Difficulty concentrating or making decisions? N  Walking or climbing stairs? Y    Comment SOB on exertion  Dressing or bathing? N  Doing errands, shopping? N  Preparing Food and eating ? N  Using the Toilet? N  In the past six months, have you accidently leaked urine? N  Do you have problems with loss of bowel control? N  Managing your Medications? N  Managing your Finances? N  Housekeeping or managing your Housekeeping? N  Some recent data might be hidden    Immunizations and Health Maintenance Immunization History  Administered Date(s) Administered  . Influenza, High Dose Seasonal PF 01/17/2015, 11/06/2016  . Influenza,inj,Quad PF,6+ Mos 10/02/2012, 11/12/2013  . Influenza-Unspecified 10/30/2015  . Pneumococcal Conjugate-13 11/12/2013  . Pneumococcal Polysaccharide-23 10/02/2012  . Tdap 02/25/2013   Health Maintenance Due  Topic Date Due  . INFLUENZA VACCINE  08/29/2016  . HEMOGLOBIN A1C  09/09/2016    Patient Care Team: [name], MD as PCP - General (Internal Medicine) Bary Castilla Forest Gleason, MD (General Surgery) [name], MD (Internal Medicine) Wellington Hampshire, MD as Consulting Physician (Cardiology)  Indicate any recent Medical Services you may have received from other than [facility] providers in the past year (date may be approximate).    Assessment:   This is a routine wellness examination for Terry [name]. The goal of the wellness visit is to assist the patient how to close the gaps in care and create a preventative care plan for the patient.   The roster of all physicians providing medical care to patient is listed in the Snapshot section of the chart.  Taking calcium VIT D as appropriate/Osteoporosis risk reviewed.    Safety issues reviewed; Smoke and carbon monoxide detectors in the home. No firearms in the home.  Wears seatbelts when driving or riding with others. Patient does wear sunscreen or protective clothing when in direct sunlight. No violence in the home.  Depression- PHQ 2 &9 complete.  No signs/symptoms or verbal  communication regarding little pleasure in doing things, feeling down, depressed or hopeless. No changes in sleeping, energy, eating, concentrating.  No thoughts of self harm or harm towards others.  Time spent on this topic is 8 minutes.   Patient is alert, normal appearance, oriented to person/place/and time.  Correctly identified the president of the [place], recall of 3/3 words, and performing simple calculations. Displays appropriate judgement and can read correct time from watch face.   No new identified risk were noted.  No failures at ADL's or IADL's.   BMI- discussed the importance of a healthy diet, water intake and the benefits of aerobic exercise. Educational material provided.   24 hour diet recall: Breakfast: toast Lunch: deli sandwich Dinner: porkchops, squash  Daily fluid intake: 2 cups of caffeine, 1 cups of water  Dental- dentures.  Works at [employer] 4 hours, 5 days  Sleep patterns- Sleeps 5-6 hours at night.  Wakes feeling rested.  High dose infuenza administered R  deltoid, tolerated well. Educational material provided.  Health maintenance gaps- closed.  Patient Concerns: None at this time. Follow up with PCP as needed.  Hearing/Vision screen Hearing Screening Comments: Patient is able to hear conversational tones without difficulty.  No issues reported.   Vision Screening Comments: Followed by [facility]  Wears corrective lenses No retinopathy reported Last OV 05/2016 Cataract extraction, bilateral Visual acuity not assessed per patient preference since they have regular follow up with the ophthalmologist  Dietary issues and exercise activities discussed: Current Exercise Habits: Home exercise routine, Type of exercise: walking, Time (Minutes): 20, Frequency (Times/Week): 4, Weekly Exercise (Minutes/Week): 80, Intensity: Moderate  Goals    . Healthy Lifetsyle          Stay hydrated, increase water intake Stay active Low carb foods       Depression Screen PHQ 2/9 Scores 11/06/2016 07/18/2016 01/02/2016  PHQ - 2 Score 0 0 0    Fall Risk Fall Risk  11/06/2016 07/18/2016 01/02/2016  Falls in the past year? No No No    Cognitive Function: MMSE - Mini Mental State Exam 11/06/2016  Orientation to time 5  Orientation to Place 5  Registration 3  Attention/ Calculation 5  Recall 3  Language- name 2 objects 2  Language- repeat 1  Language- follow 3 step command 3  Language- read & follow direction 1  Write a sentence 1  Copy design 1  Total score 30        Screening Tests Health Maintenance  Topic Date Due  . INFLUENZA VACCINE  08/29/2016  . HEMOGLOBIN A1C  09/09/2016  . FOOT EXAM  03/12/2017  . OPHTHALMOLOGY EXAM  05/25/2017  . TETANUS/TDAP  02/26/2023  . PNA vac Low Risk Adult  Completed        Plan:    End of life planning; Advance aging; Advanced directives discussed. Copy of current HCPOA/Living Will requested.    I have personally reviewed and noted the following in the patient's chart:   . Medical and social history . Use of alcohol, tobacco or illicit drugs  . Current medications and supplements . Functional ability and status . Nutritional status . Physical activity . Advanced directives . List of other physicians . Hospitalizations, surgeries, and ER visits in previous 12 months . Vitals . Screenings to include cognitive, depression, and falls . Referrals and appointments  In addition, I have reviewed and discussed with patient certain preventive protocols, quality metrics, and best practice recommendations. A written personalized care plan for preventive services as well as general preventive health recommendations were provided to patient.     OBrien-Blaney, Emersen Carroll L, LPN   [id number]     I have reviewed the above information and agree with above.   Deborra Medina, MD

## 2016-11-15 DIAGNOSIS — E538 Deficiency of other specified B group vitamins: Secondary | ICD-10-CM | POA: Diagnosis not present

## 2016-11-15 DIAGNOSIS — E119 Type 2 diabetes mellitus without complications: Secondary | ICD-10-CM | POA: Diagnosis not present

## 2016-11-15 DIAGNOSIS — E0590 Thyrotoxicosis, unspecified without thyrotoxic crisis or storm: Secondary | ICD-10-CM | POA: Diagnosis not present

## 2016-11-20 ENCOUNTER — Ambulatory Visit: Payer: PPO | Admitting: *Deleted

## 2016-11-20 VITALS — Wt 165.6 lb

## 2016-11-20 DIAGNOSIS — R634 Abnormal weight loss: Secondary | ICD-10-CM

## 2016-11-20 NOTE — Progress Notes (Addendum)
Patient came in today for a weight check.  Weight is 165.6lb   Weight up 3.6 pounds since September exam with dietary supplements.  Recheck in 2 months.

## 2016-11-21 ENCOUNTER — Telehealth: Payer: Self-pay | Admitting: *Deleted

## 2016-11-21 NOTE — Telephone Encounter (Signed)
-----   Message from Robert Bellow, MD sent at 11/21/2016  8:24 AM EDT ----- Janett Billow: Repeat weight check in 2 months.

## 2016-11-21 NOTE — Telephone Encounter (Signed)
appt made as requested.

## 2016-11-22 ENCOUNTER — Ambulatory Visit: Payer: PPO

## 2016-11-23 DIAGNOSIS — E538 Deficiency of other specified B group vitamins: Secondary | ICD-10-CM | POA: Diagnosis not present

## 2016-11-23 DIAGNOSIS — E119 Type 2 diabetes mellitus without complications: Secondary | ICD-10-CM | POA: Diagnosis not present

## 2016-11-23 DIAGNOSIS — E0590 Thyrotoxicosis, unspecified without thyrotoxic crisis or storm: Secondary | ICD-10-CM | POA: Diagnosis not present

## 2016-11-23 DIAGNOSIS — Z87898 Personal history of other specified conditions: Secondary | ICD-10-CM | POA: Diagnosis not present

## 2016-11-28 ENCOUNTER — Other Ambulatory Visit: Payer: Self-pay | Admitting: Internal Medicine

## 2016-12-13 ENCOUNTER — Other Ambulatory Visit: Payer: Self-pay | Admitting: Internal Medicine

## 2016-12-25 ENCOUNTER — Encounter: Payer: Self-pay | Admitting: Internal Medicine

## 2016-12-27 NOTE — Telephone Encounter (Signed)
Pt would like to have a rx for sildenifil printed off and mailed to him. I do not see this in the pt's chart.

## 2016-12-28 ENCOUNTER — Encounter: Payer: Self-pay | Admitting: Internal Medicine

## 2016-12-31 MED ORDER — SILDENAFIL CITRATE 20 MG PO TABS: 20.0000 mg | ORAL_TABLET | Freq: Three times a day (TID) | ORAL | 5 refills | Status: DC

## 2017-01-03 ENCOUNTER — Encounter: Payer: Self-pay | Admitting: Internal Medicine

## 2017-01-04 NOTE — Telephone Encounter (Signed)
Patient came in today. Hard copy of prescription was given to patient.

## 2017-01-07 ENCOUNTER — Other Ambulatory Visit: Payer: Self-pay | Admitting: Internal Medicine

## 2017-01-10 ENCOUNTER — Other Ambulatory Visit: Payer: Self-pay | Admitting: Internal Medicine

## 2017-01-15 ENCOUNTER — Ambulatory Visit: Payer: Self-pay

## 2017-02-18 ENCOUNTER — Telehealth: Payer: Self-pay | Admitting: Internal Medicine

## 2017-02-18 DIAGNOSIS — M25562 Pain in left knee: Secondary | ICD-10-CM

## 2017-02-18 NOTE — Telephone Encounter (Signed)
Please advise 

## 2017-02-18 NOTE — Telephone Encounter (Signed)
Copied from [place] [id number]. Topic: Quick Communication - See Telephone Encounter >> Feb 18, 2017  1:09 PM Cleaster Corin, NT wrote: CRM for notification. See Telephone encounter for:   02/18/17. Pt. Would like to speak with Juliann Pulse. Pt. Would not disclose what he needs to talk to her about pt. Can be reached at [phone]

## 2017-02-18 NOTE — Telephone Encounter (Signed)
Patient fell during the Holidays, has recovered mostly but is having a problem with left knee, at night very uncomfortable  Touching and painful to walk on. Patient pain level at 4-5 continuous and with weight bearing 7-9. I have scheduled patient for 02/25/17 the earliest I could find, would you want patient to come in for X-ray?

## 2017-02-19 ENCOUNTER — Ambulatory Visit (INDEPENDENT_AMBULATORY_CARE_PROVIDER_SITE_OTHER): Payer: PPO

## 2017-02-19 DIAGNOSIS — S8992XA Unspecified injury of left lower leg, initial encounter: Secondary | ICD-10-CM | POA: Diagnosis not present

## 2017-02-19 DIAGNOSIS — M25562 Pain in left knee: Secondary | ICD-10-CM

## 2017-02-19 NOTE — Telephone Encounter (Signed)
Yes,  X rays have been ordered of left knee .  He should be using crutches regardless (or a walker)

## 2017-02-19 NOTE — Telephone Encounter (Signed)
Patient coming in X-ray today.

## 2017-02-20 ENCOUNTER — Encounter: Payer: Self-pay | Admitting: Internal Medicine

## 2017-02-25 ENCOUNTER — Ambulatory Visit (INDEPENDENT_AMBULATORY_CARE_PROVIDER_SITE_OTHER): Payer: PPO | Admitting: Internal Medicine

## 2017-02-25 ENCOUNTER — Encounter: Payer: Self-pay | Admitting: Internal Medicine

## 2017-02-25 VITALS — BP 128/62 | HR 70 | Temp 98.0°F | Resp 15 | Ht 68.0 in | Wt 173.0 lb

## 2017-02-25 DIAGNOSIS — R944 Abnormal results of kidney function studies: Secondary | ICD-10-CM

## 2017-02-25 DIAGNOSIS — E1122 Type 2 diabetes mellitus with diabetic chronic kidney disease: Secondary | ICD-10-CM

## 2017-02-25 DIAGNOSIS — R809 Proteinuria, unspecified: Secondary | ICD-10-CM

## 2017-02-25 DIAGNOSIS — R634 Abnormal weight loss: Secondary | ICD-10-CM

## 2017-02-25 DIAGNOSIS — N182 Chronic kidney disease, stage 2 (mild): Secondary | ICD-10-CM | POA: Diagnosis not present

## 2017-02-25 DIAGNOSIS — D126 Benign neoplasm of colon, unspecified: Secondary | ICD-10-CM | POA: Diagnosis not present

## 2017-02-25 DIAGNOSIS — M25562 Pain in left knee: Secondary | ICD-10-CM | POA: Diagnosis not present

## 2017-02-25 DIAGNOSIS — I10 Essential (primary) hypertension: Secondary | ICD-10-CM

## 2017-02-25 LAB — COMPREHENSIVE METABOLIC PANEL
ALT: 11 U/L (ref 0–53)
AST: 15 U/L (ref 0–37)
Albumin: 3.9 g/dL (ref 3.5–5.2)
Alkaline Phosphatase: 54 U/L (ref 39–117)
BILIRUBIN TOTAL: 0.6 mg/dL (ref 0.2–1.2)
BUN: 20 mg/dL (ref 6–23)
CO2: 28 meq/L (ref 19–32)
CREATININE: 1.56 mg/dL — AB (ref 0.40–1.50)
Calcium: 9 mg/dL (ref 8.4–10.5)
Chloride: 102 mEq/L (ref 96–112)
GFR: 45.51 mL/min — ABNORMAL LOW (ref >60.00)
Glucose, Bld: 130 mg/dL — ABNORMAL HIGH (ref 70–99)
Potassium: 4.6 mEq/L (ref 3.5–5.1)
SODIUM: 137 meq/L (ref 135–145)
Total Protein: 6.6 g/dL (ref 6.0–8.3)

## 2017-02-25 LAB — LIPID PANEL
CHOL/HDL RATIO: 2
Cholesterol: 135 mg/dL (ref 0–200)
HDL: 63.7 mg/dL (ref >39.00)
LDL Cholesterol: 54 mg/dL (ref 0–99)
NONHDL: 70.83
TRIGLYCERIDES: 83 mg/dL (ref 0.0–149.0)
VLDL: 16.6 mg/dL (ref 0.0–40.0)

## 2017-02-25 LAB — HEMOGLOBIN A1C: HEMOGLOBIN A1C: 7 % — AB (ref 4.6–6.5)

## 2017-02-25 MED ORDER — CELECOXIB 200 MG PO CAPS: 200.0000 mg | ORAL_CAPSULE | Freq: Two times a day (BID) | ORAL | Status: DC

## 2017-02-25 NOTE — Patient Instructions (Addendum)
No more ibuprofen once you start the celebrex,  Reduce twice daily use to once daily after one week  Continue tylenol up to 2000 mg daily in divided   Cushion the knee during sleep with a pillow  At night  If no resolution by end of february make appt for an MRI to rule out meniscal tear ,  And I can give you a steroid injection   Try using 25 mg benadryl  just at night for the PND

## 2017-02-25 NOTE — Progress Notes (Signed)
Subjective:  Patient ID: Terry [name], male    DOB: 05-19-1933  Age: 84 y.o. MRN: [id number]  CC: The primary encounter diagnosis was Essential hypertension. Diagnoses of Type 2 diabetes mellitus with stage 2 chronic kidney disease, unspecified whether long term insulin use ([place]), Tubulovillous adenoma of colon, Left anterior knee pain, and Weight loss, unintentional were also pertinent to this visit.  HPI Terry [name] presents for evaluation of persistent knee pain.   SUBACUTE LEFT KNEE PAIN THAT HAS BEEN PERSISTENT SINCE HE HAD A  FALL OVER THE HOLIDAYS.  The fall occurred when he got up from a chair and his left leg buckled and fell face first to a hard floor.  He has a history of left ankle weakness secondary to remote fracture. He denies any subsequent bruising or swelling to the knee.  Has been taking Advil prn not more than 1-3 tablets daily.  The pain is worse by the end of the day and worse after inactivity.  It has improved somewhat since the fall.    plain films were done  Last week to rule out fracture . Mild denerative changes were seen without an  effusion   He remains tobacco abstinent   Lab Results  Component Value Date   WBC 7.6 08/06/2016   HGB 11.0 (L) 08/06/2016   HCT 33.6 (L) 08/06/2016   MCV 91 08/06/2016   PLT 330 08/06/2016    Outpatient Medications Prior to Visit  Medication Sig Dispense Refill  . aspirin EC 81 MG tablet Take 1 tablet (81 mg total) by mouth daily. 90 tablet 3  . b complex vitamins tablet Take 1 tablet by mouth daily.    . benazepril (LOTENSIN) 40 MG tablet TAKE 1 TABLET (40 MG TOTAL) BY MOUTH DAILY. 90 tablet 1  . Cholecalciferol (VITAMIN D3) 1000 UNITS CAPS Take by mouth.    . COMBIVENT RESPIMAT 20-100 MCG/ACT AERS respimat Use as directed 1 puff in the mouth or throat 4 (four) times daily as needed.  2  . cyanocobalamin 1000 MCG tablet Take 1,000 mcg by mouth daily.    . metFORMIN (GLUCOPHAGE-XR) 500 MG 24 hr tablet Take 500 mg by mouth  daily.    . methimazole (TAPAZOLE) 5 MG tablet Take 2.5 mg by mouth daily.    . metoprolol succinate (TOPROL-XL) 50 MG 24 hr tablet TAKE 1 TABLET (50 MG TOTAL) BY MOUTH DAILY. 90 tablet 1  . omeprazole (PRILOSEC) 40 MG capsule Take 1 capsule (40 mg total) by mouth daily. 30 capsule 3  . sildenafil (REVATIO) 20 MG tablet Take 1 tablet (20 mg total) by mouth 3 (three) times daily. 50 tablet 5  . simvastatin (ZOCOR) 40 MG tablet TAKE 1 TABLET BY MOUTH AT BEDTIME 30 tablet 3  . tamsulosin (FLOMAX) 0.4 MG CAPS capsule TAKE 1 CAPSULE (0.4 MG TOTAL) BY MOUTH DAILY. 30 capsule 3  . Tiotropium Bromide Monohydrate (SPIRIVA RESPIMAT IN) Inhale 1 spray into the lungs daily.    Marland Kitchen doxycycline (VIBRA-TABS) 100 MG tablet Take 1 tablet (100 mg total) by mouth 2 (two) times daily. (Patient not taking: Reported on 02/25/2017) 14 tablet 0   Facility-Administered Medications Prior to Visit  Medication Dose Route Frequency Provider Last Rate Last Dose  . ipratropium-albuterol (DUONEB) 0.5-2.5 (3) MG/3ML nebulizer solution 3 mL  3 mL Nebulization Q6H Burnard Hawthorne, FNP        Review of Systems;  Patient denies headache, fevers, malaise, unintentional weight loss, skin rash, eye pain,  sinus congestion and sinus pain, sore throat, dysphagia,  hemoptysis , cough, dyspnea, wheezing, chest pain, palpitations, orthopnea, edema, abdominal pain, nausea, melena, diarrhea, constipation, flank pain, dysuria, hematuria, urinary  Frequency, nocturia, numbness, tingling, seizures,  Focal weakness, Loss of consciousness,  Tremor, insomnia, depression, anxiety, and suicidal ideation.      Objective:  BP 128/62 (BP Location: Left Arm, Patient Position: Sitting, Cuff Size: Normal)   Pulse 70   Temp 98 F (36.7 C) (Oral)   Resp 15   Ht 5\' 8"  (1.727 m)   Wt 173 lb (78.5 kg)   SpO2 93%   BMI 26.30 kg/m   BP Readings from Last 3 Encounters:  02/25/17 128/62  11/06/16 118/62  10/17/16 122/68    Wt Readings from Last 3  Encounters:  02/25/17 173 lb (78.5 kg)  11/20/16 165 lb 9.6 oz (75.1 kg)  11/06/16 164 lb (74.4 kg)    General appearance: alert, cooperative and appears stated age Ears: normal TM's and external ear canals both ears Throat: lips, mucosa, and tongue normal; teeth and gums normal Neck: no adenopathy, no carotid bruit, supple, symmetrical, trachea midline and thyroid not enlarged, symmetric, no tenderness/mass/nodules Back: symmetric, no curvature. ROM normal. No CVA tenderness. Lungs: clear to auscultation bilaterally Heart: regular rate and rhythm, S1, S2 normal, no murmur, click, rub or gallop Abdomen: soft, non-tender; bowel sounds normal; no masses,  no organomegaly Pulses: 2+ and symmetric Skin: Skin color, texture, turgor normal. No rashes or lesions Lymph nodes: Cervical, supraclavicular, and axillary nodes normal.  Lab Results  Component Value Date   HGBA1C 7.0 (H) 02/25/2017   HGBA1C 6.8 (H) 03/12/2016   HGBA1C 6.7 11/11/2015    Lab Results  Component Value Date   CREATININE 1.56 (H) 02/25/2017   CREATININE 1.29 (H) 08/06/2016   CREATININE 1.28 03/12/2016    Lab Results  Component Value Date   WBC 7.6 08/06/2016   HGB 11.0 (L) 08/06/2016   HCT 33.6 (L) 08/06/2016   PLT 330 08/06/2016   GLUCOSE 130 (H) 02/25/2017   CHOL 135 02/25/2017   TRIG 83.0 02/25/2017   HDL 63.70 02/25/2017   LDLDIRECT 49.0 03/12/2016   LDLCALC 54 02/25/2017   ALT 11 02/25/2017   AST 15 02/25/2017   NA 137 02/25/2017   K 4.6 02/25/2017   CL 102 02/25/2017   CREATININE 1.56 (H) 02/25/2017   BUN 20 02/25/2017   CO2 28 02/25/2017   TSH 1.370 08/06/2016   PSA 0.75 11/12/2013   INR 0.9 12/12/2012   HGBA1C 7.0 (H) 02/25/2017   MICROALBUR 12.2 (H) 03/12/2016    Dg Chest 2 View  Result Date: 10/23/2016 CLINICAL DATA:  Right lower chest pain for 2 months, some weight loss, history of COPD EXAM: CHEST  2 VIEW COMPARISON:  Chest x-ray of 10/02/2012 FINDINGS: The lungs remain clear but  hyperaerated suggestive of mild emphysematous change. Mediastinal and hilar contours are unremarkable. Minimal basilar linear scarring is present. No pneumonia or effusion is seen. The heart is within normal limits in size. Only mild degenerative changes present in the lower thoracic spine for age. IMPRESSION: Hyperaeration may indicate mild emphysematous change. No active lung disease. Electronically Signed   By: Ivar Drape M.D.   On: 10/23/2016 08:39   US Abdomen Complete  Result Date: 10/23/2016 CLINICAL DATA:  Weight loss, abdominal pain EXAM: ABDOMEN ULTRASOUND COMPLETE COMPARISON:  CT 08/15/2016 FINDINGS: Gallbladder: No gallstones or wall thickening visualized. No sonographic Murphy sign noted by sonographer. Common bile duct: Diameter:  Normal caliber, 3 mm. Liver: No focal lesion identified. Within normal limits in parenchymal echogenicity. Portal vein is patent on color Doppler imaging with normal direction of blood flow towards the liver. IVC: No abnormality visualized. Pancreas: Not well visualized due to overlying bowel gas. Spleen: Size and appearance within normal limits. Right Kidney: Length: 11.0 cm. 2.7 cm cyst off the lower pole. No hydronephrosis. Normal echotexture. Left Kidney: Length: 11.3 cm. Echogenicity within normal limits. No mass or hydronephrosis visualized. Abdominal aorta: No aneurysm visualized. Other findings: None. IMPRESSION: No acute findings or significant abnormality. Electronically Signed   By: Rolm Baptise M.D.   On: 10/23/2016 09:13    Assessment & Plan:   Problem List Items Addressed This Visit    Hypertension - Primary   Relevant Orders   Comprehensive metabolic panel (Completed)   Lipid panel (Completed)   Left anterior knee pain    Radiographs reviewed with patient.  Exam and history support likely diagnosis of contusion without fracture,  With pain likely due to DJD given the degenerative changes noted on films.  celebrex bid x 1 week, then once daily for  [redacted] weeks along with daily PPI and up to 200 mg tylenol.  If no improvement,  MRI knee and steroid injection recommended.       Tubulovillous adenoma of colon    Repeat colonoscopy along with CEA for workup of unintentional weight loss in August 2018 was negative for precancerous polyps ([place])      Type II diabetes mellitus (HCC)    Stable control,  Managed by Dr [name].  Last seen by her in October.  Labs ordered today since he has not had a1c in over 3 months   Lab Results  Component Value Date   HGBA1C 7.0 (H) 02/25/2017   Lab Results  Component Value Date   MICROALBUR 12.2 (H) 03/12/2016         Relevant Orders   Hemoglobin A1c (Completed)   Weight loss, unintentional    Resolved.  Work up for cause revealed that he was getting most of his caloric intake from wine, which he has continued to abstain from.          I have discontinued Terry [name]. Terry [name] "FRANK"'s doxycycline. I am also having him start on celecoxib. Additionally, I am having him maintain his methimazole, Vitamin D3, cyanocobalamin, b complex vitamins, COMBIVENT RESPIMAT, Tiotropium Bromide Monohydrate (SPIRIVA RESPIMAT IN), aspirin EC, metFORMIN, omeprazole, benazepril, tamsulosin, sildenafil, simvastatin, and metoprolol succinate. We will continue to administer ipratropium-albuterol.  Meds ordered this encounter  Medications  . celecoxib (CELEBREX) 200 MG capsule    Sig: Take 1 capsule (200 mg total) by mouth 2 (two) times daily. For one week, then once daily thereafter    Dispense:  37 capsule    Refill:  01    Medications Discontinued During This Encounter  Medication Reason  . doxycycline (VIBRA-TABS) 100 MG tablet Completed Course    Follow-up: Return in about 6 months (around 08/25/2017).   [name], MD

## 2017-02-26 ENCOUNTER — Encounter: Payer: Self-pay | Admitting: Internal Medicine

## 2017-02-26 DIAGNOSIS — M25562 Pain in left knee: Secondary | ICD-10-CM | POA: Insufficient documentation

## 2017-02-26 NOTE — Assessment & Plan Note (Signed)
Stable control,  Managed by Dr [name].  Last seen by her in October.  Labs ordered today since he has not had a1c in over 3 months   Lab Results  Component Value Date   HGBA1C 7.0 (H) 02/25/2017   Lab Results  Component Value Date   MICROALBUR 12.2 (H) 03/12/2016

## 2017-02-26 NOTE — Assessment & Plan Note (Signed)
Resolved.  Work up for cause revealed that he was getting most of his caloric intake from wine, which he has continued to abstain from.  

## 2017-02-26 NOTE — Assessment & Plan Note (Signed)
Radiographs reviewed with patient.  Exam and history support likely diagnosis of contusion without fracture,  With pain likely due to DJD given the degenerative changes noted on films.  celebrex bid x 1 week, then once daily for [redacted] weeks along with daily PPI and up to 200 mg tylenol.  If no improvement,  MRI knee and steroid injection recommended.

## 2017-02-26 NOTE — Assessment & Plan Note (Addendum)
Repeat colonoscopy along with CEA for workup of unintentional weight loss in August 2018 was negative for precancerous polyps ([place])

## 2017-04-29 ENCOUNTER — Ambulatory Visit: Payer: PPO | Admitting: Internal Medicine

## 2017-05-01 ENCOUNTER — Encounter: Payer: Self-pay | Admitting: Family

## 2017-05-01 ENCOUNTER — Ambulatory Visit (INDEPENDENT_AMBULATORY_CARE_PROVIDER_SITE_OTHER): Payer: PPO | Admitting: Family

## 2017-05-01 VITALS — BP 140/80 | HR 76 | Temp 98.3°F | Resp 16 | Wt 173.4 lb

## 2017-05-01 DIAGNOSIS — M7022 Olecranon bursitis, left elbow: Secondary | ICD-10-CM

## 2017-05-01 NOTE — Progress Notes (Signed)
Subjective:    Patient ID: Terry [name], male    DOB: [birth year], 84 y.o.   MRN: [id number]  CC: MATTEW CHRISWELL is a 84 y.o. male who presents today for an acute visit.    HPI: Left elbow swelling, 4 weeks, unchanged. Tried hot and cold with no relief.  States it is not painful however it is annoying.  He states that he drives with his left elbow on the car door. Notes he fell 4 months ago on left elbow and had pain at that time, no swelling.  Overall feels well today.  No fevers, chills.  No laceration, or wound to left elbow.  No history of gout.  Saw PCP 01/2027 discussed fall. Had xray of left knee- no acute abnormality.    H/o ckd   HISTORY:  Past Medical History:  Diagnosis Date  . Anemia   . COPD (chronic obstructive pulmonary disease) ([place])   . Diabetes mellitus 2008  . GERD (gastroesophageal reflux disease)   . Hyperlipidemia   . Hypertension   . hyperthyroidism   . Squamous cell carcinoma of skin of left upper arm November 2015   Excised to negative margins, associated keratoacanthoma.   Past Surgical History:  Procedure Laterality Date  . APPENDECTOMY  1970  . ARM SKIN LESION BIOPSY / EXCISION Left 2016  . CATARACT EXTRACTION, BILATERAL  2012   Porfilio  . COLONOSCOPY  12-10-12  . COLONOSCOPY W/ POLYPECTOMY  2011   Dr. Raina Mina  . COLONOSCOPY WITH PROPOFOL N/A 09/19/2016   Procedure: COLONOSCOPY WITH PROPOFOL;  Surgeon: Robert Bellow, MD;  Location: [facility] ENDOSCOPY;  Service: Endoscopy;  Laterality: N/A;  . ESOPHAGOGASTRODUODENOSCOPY (EGD) WITH PROPOFOL N/A 09/19/2016   Procedure: ESOPHAGOGASTRODUODENOSCOPY (EGD) WITH PROPOFOL;  Surgeon: Robert Bellow, MD;  Location: [facility] ENDOSCOPY;  Service: Endoscopy;  Laterality: N/A;   Family History  Problem Relation Age of Onset  . Hypertension Father   . Diabetes Maternal Aunt   . Hyperlipidemia Maternal Uncle   . Birth defects Neg Hx     Allergies: Patient has no known allergies. Current Outpatient  Medications on File Prior to Visit  Medication Sig Dispense Refill  . aspirin EC 81 MG tablet Take 1 tablet (81 mg total) by mouth daily. 90 tablet 3  . b complex vitamins tablet Take 1 tablet by mouth daily.    . benazepril (LOTENSIN) 40 MG tablet TAKE 1 TABLET (40 MG TOTAL) BY MOUTH DAILY. 90 tablet 1  . celecoxib (CELEBREX) 200 MG capsule Take 1 capsule (200 mg total) by mouth 2 (two) times daily. For one week, then once daily thereafter 37 capsule 01  . Cholecalciferol (VITAMIN D3) 1000 UNITS CAPS Take by mouth.    . COMBIVENT RESPIMAT 20-100 MCG/ACT AERS respimat Use as directed 1 puff in the mouth or throat 4 (four) times daily as needed.  2  . cyanocobalamin 1000 MCG tablet Take 1,000 mcg by mouth daily.    . metFORMIN (GLUCOPHAGE-XR) 500 MG 24 hr tablet Take 500 mg by mouth daily.    . methimazole (TAPAZOLE) 5 MG tablet Take 2.5 mg by mouth daily.    . metoprolol succinate (TOPROL-XL) 50 MG 24 hr tablet TAKE 1 TABLET (50 MG TOTAL) BY MOUTH DAILY. 90 tablet 1  . omeprazole (PRILOSEC) 40 MG capsule Take 1 capsule (40 mg total) by mouth daily. 30 capsule 3  . sildenafil (REVATIO) 20 MG tablet Take 1 tablet (20 mg total) by mouth 3 (three) times daily. [place]  tablet 5  . simvastatin (ZOCOR) 40 MG tablet TAKE 1 TABLET BY MOUTH AT BEDTIME 30 tablet 3  . tamsulosin (FLOMAX) 0.4 MG CAPS capsule TAKE 1 CAPSULE (0.4 MG TOTAL) BY MOUTH DAILY. 30 capsule 3  . Tiotropium Bromide Monohydrate (SPIRIVA RESPIMAT IN) Inhale 1 spray into the lungs daily.     Current Facility-Administered Medications on File Prior to Visit  Medication Dose Route Frequency Provider Last Rate Last Dose  . ipratropium-albuterol (DUONEB) 0.5-2.5 (3) MG/3ML nebulizer solution 3 mL  3 mL Nebulization Q6H Arnett, Yvetta Coder, FNP        Social History   Tobacco Use  . Smoking status: Former Smoker    Packs/day: 2.00    Years: 50.00    Pack years: 100.00    Types: Cigarettes    Last attempt to quit: 06/26/1999    Years since  quitting: 17.8  . Smokeless tobacco: Never Used  Substance Use Topics  . Alcohol use: Yes    Alcohol/week: 15.0 oz    Types: 25 Glasses of wine per week  . Drug use: No    Review of Systems  Constitutional: Negative for chills and fever.  Respiratory: Negative for cough.   Cardiovascular: Negative for chest pain and palpitations.  Gastrointestinal: Negative for nausea and vomiting.  Musculoskeletal: Positive for joint swelling. Negative for arthralgias and myalgias.      Objective:    BP 140/80 (BP Location: Left Arm, Patient Position: Sitting, Cuff Size: Normal)   Pulse 76   Temp 98.3 F (36.8 C) (Oral)   Resp 16   Wt 173 lb 6 oz (78.6 kg)   SpO2 93%   BMI 26.36 kg/m    Physical Exam  Constitutional: He appears well-developed and well-nourished.  Cardiovascular: Regular rhythm and normal heart sounds.  Pulmonary/Chest: Effort normal and breath sounds normal. No respiratory distress. He has no wheezes. He has no rhonchi. He has no rales.  Musculoskeletal:       Left elbow: He exhibits swelling. He exhibits normal range of motion, no deformity and no laceration. No tenderness found.  Well-defined and discrete soft fluid-filled cyst distal olecranon of left elbow.  Approx 5cm in diameter.  No increase warmth appreciated.  No erythema.  No wound or skin changes noted.  Nontender. Able to fully extend left elbow.   Neurological: He is alert.  Skin: Skin is warm and dry.  Psychiatric: He has a normal mood and affect. His speech is normal and behavior is normal.  Vitals reviewed.      Assessment & Plan:   1. Olecranon bursitis, left elbow Patient well-appearing and no systemic features. Low clinical suspicion for infection.  I discussed with him he will likely need fluid aspiration due to size of bursa. Advised joint protection. Declines lab work today ( CBC, uric acid) as placed an urgent referral to orthopedics so he can be seen this week.  he feels comfortable to awaiting  on appointment. Patient needed to get back to work this morning prior to scheduling his appointment while here in clinic. He understands we will call with an appointment and will let us know if he hasn't heard from [place] today. Return precautions given.   - Ambulatory referral to Orthopedic Surgery   I am having Dub Mikes D. Theodoro Clock" maintain his methimazole, Vitamin D3, cyanocobalamin, b complex vitamins, COMBIVENT RESPIMAT, Tiotropium Bromide Monohydrate (SPIRIVA RESPIMAT IN), aspirin EC, metFORMIN, omeprazole, benazepril, tamsulosin, sildenafil, simvastatin, metoprolol succinate, and celecoxib. We will continue to administer ipratropium-albuterol.  No orders of the defined types were placed in this encounter.   Return precautions given.   Risks, benefits, and alternatives of the medications and treatment plan prescribed today were discussed, and patient expressed understanding.   Education regarding symptom management and diagnosis given to patient on AVS.  Continue to follow with [name], MD for routine health maintenance.   Terry [name] and I agreed with plan.   Mable Paris, FNP

## 2017-05-01 NOTE — Patient Instructions (Signed)
As discussed, we will get you into orthopedics this week.   Let me know if doesn't resolve after seeing orthopedics.

## 2017-05-02 DIAGNOSIS — I129 Hypertensive chronic kidney disease with stage 1 through stage 4 chronic kidney disease, or unspecified chronic kidney disease: Secondary | ICD-10-CM | POA: Diagnosis not present

## 2017-05-02 DIAGNOSIS — N400 Enlarged prostate without lower urinary tract symptoms: Secondary | ICD-10-CM | POA: Diagnosis not present

## 2017-05-02 DIAGNOSIS — N183 Chronic kidney disease, stage 3 (moderate): Secondary | ICD-10-CM | POA: Diagnosis not present

## 2017-05-02 DIAGNOSIS — E1129 Type 2 diabetes mellitus with other diabetic kidney complication: Secondary | ICD-10-CM | POA: Diagnosis not present

## 2017-05-02 LAB — BASIC METABOLIC PANEL
BUN: 15 (ref 4–21)
Creatinine: 1.3 (ref 0.6–1.3)
GLUCOSE: 100
Potassium: 4.7 (ref 3.4–5.3)
Sodium: 139 (ref 137–147)

## 2017-05-02 LAB — CBC AND DIFFERENTIAL
HEMATOCRIT: 34 — AB (ref 41–53)
HEMOGLOBIN: 11.3 — AB (ref 13.5–17.5)
NEUTROS ABS: 4
PLATELETS: 298 (ref 150–399)
WBC: 7

## 2017-05-07 ENCOUNTER — Other Ambulatory Visit: Payer: Self-pay | Admitting: Internal Medicine

## 2017-05-08 DIAGNOSIS — M7022 Olecranon bursitis, left elbow: Secondary | ICD-10-CM | POA: Diagnosis not present

## 2017-05-09 ENCOUNTER — Ambulatory Visit: Payer: PPO | Admitting: Podiatry

## 2017-05-09 ENCOUNTER — Encounter: Payer: Self-pay | Admitting: Podiatry

## 2017-05-09 DIAGNOSIS — Q828 Other specified congenital malformations of skin: Secondary | ICD-10-CM

## 2017-05-09 DIAGNOSIS — E1142 Type 2 diabetes mellitus with diabetic polyneuropathy: Secondary | ICD-10-CM

## 2017-05-09 NOTE — Progress Notes (Signed)
This patient presents to the office with chief complaint of a painful callus left foot.  Patient states this callus is painful walking and wearing his shoes.  Patient was previously seen in 2018 for the same problem.  He presents the office today for preventative foot care services.  General Appearance  Alert, conversant and in no acute stress.  Vascular  Dorsalis pedis and posterior tibial  pulses are palpable  bilaterally.  Capillary return is within normal limits  bilaterally. Temperature is within normal limits  bilaterally.  Neurologic  Senn-Weinstein monofilament wire test within normal limits  bilaterally. Muscle power within normal limits bilaterally.  Nails Thick disfigured discolored nails with subungual debris  from hallux to fifth toes bilaterally. No evidence of bacterial infection or drainage bilaterally.  Orthopedic  No limitations of motion of motion feet .  No crepitus or effusions noted.  No bony pathology or digital deformities noted. Plantarflexed fifth metatarsal bilaterally.  Skin  normotropic skin with  noted bilaterally.  No signs of infections or ulcers noted.  Porokeratosis noted sub-fifth left foot.  Porokeratosis secondary to plantarflexed fifth metatarsal left foot.  ROV  Debride porokeratosis left foot.  Recommended Spenco three-quarter orthotics to help add cushion  to his feet.   Gardiner Barefoot DPM

## 2017-05-20 DIAGNOSIS — E119 Type 2 diabetes mellitus without complications: Secondary | ICD-10-CM | POA: Diagnosis not present

## 2017-05-20 DIAGNOSIS — E0590 Thyrotoxicosis, unspecified without thyrotoxic crisis or storm: Secondary | ICD-10-CM | POA: Diagnosis not present

## 2017-05-24 ENCOUNTER — Other Ambulatory Visit: Payer: Self-pay | Admitting: Internal Medicine

## 2017-05-28 ENCOUNTER — Other Ambulatory Visit: Payer: Self-pay

## 2017-06-08 ENCOUNTER — Other Ambulatory Visit: Payer: Self-pay | Admitting: Internal Medicine

## 2017-06-17 DIAGNOSIS — X32XXXA Exposure to sunlight, initial encounter: Secondary | ICD-10-CM | POA: Diagnosis not present

## 2017-06-17 DIAGNOSIS — L570 Actinic keratosis: Secondary | ICD-10-CM | POA: Diagnosis not present

## 2017-06-17 DIAGNOSIS — L821 Other seborrheic keratosis: Secondary | ICD-10-CM | POA: Diagnosis not present

## 2017-06-17 DIAGNOSIS — D0461 Carcinoma in situ of skin of right upper limb, including shoulder: Secondary | ICD-10-CM | POA: Diagnosis not present

## 2017-06-17 DIAGNOSIS — D485 Neoplasm of uncertain behavior of skin: Secondary | ICD-10-CM | POA: Diagnosis not present

## 2017-07-06 ENCOUNTER — Other Ambulatory Visit: Payer: Self-pay | Admitting: Internal Medicine

## 2017-08-12 DIAGNOSIS — D0461 Carcinoma in situ of skin of right upper limb, including shoulder: Secondary | ICD-10-CM | POA: Diagnosis not present

## 2017-08-26 ENCOUNTER — Ambulatory Visit: Payer: PPO | Admitting: Internal Medicine

## 2017-10-08 ENCOUNTER — Ambulatory Visit (INDEPENDENT_AMBULATORY_CARE_PROVIDER_SITE_OTHER): Payer: PPO | Admitting: Internal Medicine

## 2017-10-08 ENCOUNTER — Encounter: Payer: Self-pay | Admitting: Internal Medicine

## 2017-10-08 VITALS — BP 136/64 | HR 87 | Temp 98.5°F | Resp 14 | Ht 68.0 in | Wt 166.6 lb

## 2017-10-08 DIAGNOSIS — E785 Hyperlipidemia, unspecified: Secondary | ICD-10-CM | POA: Diagnosis not present

## 2017-10-08 DIAGNOSIS — R634 Abnormal weight loss: Secondary | ICD-10-CM | POA: Diagnosis not present

## 2017-10-08 DIAGNOSIS — E0590 Thyrotoxicosis, unspecified without thyrotoxic crisis or storm: Secondary | ICD-10-CM | POA: Diagnosis not present

## 2017-10-08 DIAGNOSIS — R338 Other retention of urine: Secondary | ICD-10-CM | POA: Diagnosis not present

## 2017-10-08 DIAGNOSIS — N182 Chronic kidney disease, stage 2 (mild): Secondary | ICD-10-CM

## 2017-10-08 DIAGNOSIS — E1122 Type 2 diabetes mellitus with diabetic chronic kidney disease: Secondary | ICD-10-CM

## 2017-10-08 DIAGNOSIS — I10 Essential (primary) hypertension: Secondary | ICD-10-CM

## 2017-10-08 DIAGNOSIS — N401 Enlarged prostate with lower urinary tract symptoms: Secondary | ICD-10-CM

## 2017-10-08 DIAGNOSIS — J449 Chronic obstructive pulmonary disease, unspecified: Secondary | ICD-10-CM | POA: Diagnosis not present

## 2017-10-08 DIAGNOSIS — Z23 Encounter for immunization: Secondary | ICD-10-CM

## 2017-10-08 LAB — POCT GLYCOSYLATED HEMOGLOBIN (HGB A1C): HEMOGLOBIN A1C: 6.2 % — AB (ref 4.0–5.6)

## 2017-10-08 MED ORDER — OMEPRAZOLE 40 MG PO CPDR: 40.0000 mg | DELAYED_RELEASE_CAPSULE | Freq: Every day | ORAL | 5 refills | Status: DC

## 2017-10-08 NOTE — Progress Notes (Signed)
Subjective:  Patient ID: Terry [name], male    DOB: 02/16/1933  Age: 84 y.o. MRN: [id number]  CC: The primary encounter diagnosis was Hyperlipidemia with target LDL less than 70. Diagnoses of Essential hypertension, Hyperthyroidism, Type 2 diabetes mellitus with stage 2 chronic kidney disease, unspecified whether long term insulin use ([place]), Need for influenza vaccination, COPD, moderate ([place]), Enlarged prostate with urinary retention, and Weight loss, unintentional were also pertinent to this visit.  HPI BRENIN HEIDELBERGER presents for follow up on  Multiple issues  Weight tloss: weight has  Stabilized.  Appetite fine.   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  Lasts for an hour then  resolves spontaneously  Lab Results  Component Value Date   TSH 0.69 10/08/2017    follow up on diabetes.  Patient has no complaints today.  Patient is following a low glycemic index diet and taking all prescribed medications regularly without side effects.  Fasting sugars have been under less than 140 most of the time and post prandials have been under 160 except on rare occasions. Patient is exercising about 3 times per week and intentionally trying to lose weight .  Patient has had an eye exam in the last 12 months and checks feet regularly for signs of infection.  Patient does not walk barefoot outside,  And denies an numbness tingling or burning in feet. Patient is up to date on all recommended vaccinations  Outpatient Medications Prior to Visit  Medication Sig Dispense Refill  . aspirin EC 81 MG tablet Take 1 tablet (81 mg total) by mouth daily. 90 tablet 3  . b complex vitamins tablet Take 1 tablet by mouth daily.    . benazepril (LOTENSIN) 40 MG tablet TAKE 1 TABLET (40 MG TOTAL) BY MOUTH DAILY. 90 tablet 1  . Cholecalciferol (VITAMIN D3) 1000 UNITS CAPS Take by mouth.    .  COMBIVENT RESPIMAT 20-100 MCG/ACT AERS respimat Use as directed 1 puff in the mouth or throat 4 (four) times daily as needed.  2  . cyanocobalamin 1000 MCG tablet Take 1,000 mcg by mouth daily.    . metFORMIN (GLUCOPHAGE-XR) 500 MG 24 hr tablet Take 500 mg by mouth daily.    . methimazole (TAPAZOLE) 5 MG tablet Take 2.5 mg by mouth daily.    . metoprolol succinate (TOPROL-XL) 50 MG 24 hr tablet TAKE 1 TABLET (50 MG TOTAL) BY MOUTH DAILY. 90 tablet 1  . sildenafil (REVATIO) 20 MG tablet Take 1 tablet (20 mg total) by mouth 3 (three) times daily. 50 tablet 5  . simvastatin (ZOCOR) 40 MG tablet TAKE 1 TABLET BY MOUTH AT BEDTIME 90 tablet 1  . tamsulosin (FLOMAX) 0.4 MG CAPS capsule TAKE 1 CAPSULE (0.4 MG TOTAL) BY MOUTH DAILY. 90 capsule 1  . Tiotropium Bromide Monohydrate (SPIRIVA RESPIMAT IN) Inhale 1 spray into the lungs daily.    . celecoxib (CELEBREX) 200 MG capsule Take 1 capsule (200 mg total) by mouth 2 (two) times daily. For one week, then once daily thereafter 37 capsule 01  . omeprazole (PRILOSEC) 40 MG capsule TAKE ONE CAPSULE BY MOUTH EVERY DAY 30 capsule 3   Facility-Administered Medications Prior to Visit  Medication Dose Route Frequency Provider Last Rate Last Dose  . ipratropium-albuterol (DUONEB) 0.5-2.5 (3) MG/3ML nebulizer solution 3 mL  3 mL Nebulization Q6H Arnett,  Yvetta Coder, FNP        Review of Systems;  Patient denies headache, fevers, malaise, unintentional weight loss, skin rash, eye pain, sinus congestion and sinus pain, sore throat, dysphagia,  hemoptysis , cough, dyspnea, wheezing, chest pain, palpitations, orthopnea, edema, abdominal pain, nausea, melena, diarrhea, constipation, flank pain, dysuria, hematuria, urinary  Frequency, nocturia, numbness, tingling, seizures,  Focal weakness, Loss of consciousness,  Tremor, insomnia, depression, anxiety, and suicidal ideation.      Objective:  BP 136/64 (BP Location: Left Arm, Patient Position: Sitting, Cuff Size:  Normal)   Pulse 87   Temp 98.5 F (36.9 C) (Oral)   Resp 14   Ht 5\' 8"  (1.727 m)   Wt 166 lb 9.6 oz (75.6 kg)   SpO2 90%   BMI 25.33 kg/m   BP Readings from Last 3 Encounters:  10/08/17 136/64  05/01/17 140/80  02/25/17 128/62    Wt Readings from Last 3 Encounters:  10/08/17 166 lb 9.6 oz (75.6 kg)  05/01/17 173 lb 6 oz (78.6 kg)  02/25/17 173 lb (78.5 kg)    General appearance: alert, cooperative and appears stated age Ears: normal TM's and external ear canals both ears Throat: lips, mucosa, and tongue normal; teeth and gums normal Neck: no adenopathy, no carotid bruit, supple, symmetrical, trachea midline and thyroid not enlarged, symmetric, no tenderness/mass/nodules Back: symmetric, no curvature. ROM normal. No CVA tenderness. Lungs: clear to auscultation bilaterally Heart: regular rate and rhythm, S1, S2 normal, no murmur, click, rub or gallop Abdomen: soft, non-tender; bowel sounds normal; no masses,  no organomegaly Pulses: 2+ and symmetric Skin: Skin color, texture, turgor normal. No rashes or lesions Lymph nodes: Cervical, supraclavicular, and axillary nodes normal.  Lab Results  Component Value Date   HGBA1C 6.2 (A) 10/08/2017   HGBA1C 7.0 (H) 02/25/2017   HGBA1C 6.8 (H) 03/12/2016    Lab Results  Component Value Date   CREATININE 1.42 10/08/2017   CREATININE 1.3 05/02/2017   CREATININE 1.56 (H) 02/25/2017    Lab Results  Component Value Date   WBC 7.0 05/02/2017   HGB 11.3 (A) 05/02/2017   HCT 34 (A) 05/02/2017   PLT 298 05/02/2017   GLUCOSE 161 (H) 10/08/2017   CHOL 131 10/08/2017   TRIG 94.0 10/08/2017   HDL 71.30 10/08/2017   LDLDIRECT 49.0 03/12/2016   LDLCALC 41 10/08/2017   ALT 10 10/08/2017   AST 14 10/08/2017   NA 135 10/08/2017   K 4.9 10/08/2017   CL 100 10/08/2017   CREATININE 1.42 10/08/2017   BUN 16 10/08/2017   CO2 29 10/08/2017   TSH 0.69 10/08/2017   PSA 0.75 11/12/2013   INR 0.9 12/12/2012   HGBA1C 6.2 (A) 10/08/2017     MICROALBUR 12.2 (H) 03/12/2016    Dg Chest 2 View  Result Date: 10/23/2016 CLINICAL DATA:  Right lower chest pain for 2 months, some weight loss, history of COPD EXAM: CHEST  2 VIEW COMPARISON:  Chest x-ray of 10/02/2012 FINDINGS: The lungs remain clear but hyperaerated suggestive of mild emphysematous change. Mediastinal and hilar contours are unremarkable. Minimal basilar linear scarring is present. No pneumonia or effusion is seen. The heart is within normal limits in size. Only mild degenerative changes present in the lower thoracic spine for age. IMPRESSION: Hyperaeration may indicate mild emphysematous change. No active lung disease. Electronically Signed   By: Ivar Drape M.D.   On: 10/23/2016 08:39   US Abdomen Complete  Result Date: 10/23/2016 CLINICAL DATA:  Weight  loss, abdominal pain EXAM: ABDOMEN ULTRASOUND COMPLETE COMPARISON:  CT 08/15/2016 FINDINGS: Gallbladder: No gallstones or wall thickening visualized. No sonographic Murphy sign noted by sonographer. Common bile duct: Diameter: Normal caliber, 3 mm. Liver: No focal lesion identified. Within normal limits in parenchymal echogenicity. Portal vein is patent on color Doppler imaging with normal direction of blood flow towards the liver. IVC: No abnormality visualized. Pancreas: Not well visualized due to overlying bowel gas. Spleen: Size and appearance within normal limits. Right Kidney: Length: 11.0 cm. 2.7 cm cyst off the lower pole. No hydronephrosis. Normal echotexture. Left Kidney: Length: 11.3 cm. Echogenicity within normal limits. No mass or hydronephrosis visualized. Abdominal aorta: No aneurysm visualized. Other findings: None. IMPRESSION: No acute findings or significant abnormality. Electronically Signed   By: Rolm Baptise M.D.   On: 10/23/2016 09:13    Assessment & Plan:   Problem List Items Addressed This Visit    COPD, moderate ([place])    Managed with LAMA (spiriva)  And short acting beta agonist. He is no longer using  Adviar or Dulera due to cost.        Enlarged prostate with urinary retention    Managed with Flomax       Hyperlipidemia with target LDL less than 70 - Primary   Relevant Orders   Lipid panel (Completed)   Hypertension    Well controlled on current regimen. Renal function stable, no changes today.  Lab Results  Component Value Date   CREATININE 1.42 10/08/2017   Lab Results  Component Value Date   NA 135 10/08/2017   K 4.9 10/08/2017   CL 100 10/08/2017   CO2 29 10/08/2017         Relevant Orders   Comprehensive metabolic panel (Completed)   Hyperthyroidism    Managed with methimazole.  TSH is < 1   When previously it was >1.5  May have missed doses. .  Will increase dose if not.   Lab Results  Component Value Date   TSH 0.69 10/08/2017         Relevant Orders   TSH (Completed)   Type II diabetes mellitus ([facility])    Stable control,  Previously  Managed by Dr [name].    Lab Results  Component Value Date   HGBA1C 6.2 (A) 10/08/2017   Lab Results  Component Value Date   MICROALBUR 12.2 (H) 03/12/2016         Relevant Orders   POCT HgB A1C (Completed)   Weight loss, unintentional    Resolved.  Work up for cause revealed that he was getting most of his caloric intake from wine, which he has continued to abstain from.        Other Visit Diagnoses    Need for influenza vaccination       Relevant Orders   Flu vaccine HIGH DOSE PF (Fluzone High dose) (Completed)    A total of 25 minutes of face to face time was spent with patient more than half of which was spent in counselling about the above mentioned conditions  and coordination of care  I have discontinued Treshon D. Binsfeld "FRANK"'s celecoxib. I have also changed his omeprazole. Additionally, I am having him maintain his methimazole, Vitamin D3, cyanocobalamin, b complex vitamins, COMBIVENT RESPIMAT, Tiotropium Bromide Monohydrate (SPIRIVA RESPIMAT IN), aspirin EC, metFORMIN, sildenafil, tamsulosin,  simvastatin, benazepril, and metoprolol succinate. We will continue to administer ipratropium-albuterol.  Meds ordered this encounter  Medications  . omeprazole (PRILOSEC) 40 MG capsule  Sig: Take 1 capsule (40 mg total) by mouth daily.    Dispense:  30 capsule    Refill:  5    Medications Discontinued During This Encounter  Medication Reason  . omeprazole (PRILOSEC) 40 MG capsule Reorder  . celecoxib (CELEBREX) 200 MG capsule     Follow-up: Return in about 6 months (around 04/08/2018).   [name], MD

## 2017-10-08 NOTE — Patient Instructions (Signed)
I will manage and refill your medications for diabetes and hyperthyroidism  For now  Check your bottles at home to see if you need refills on the metformin and methimazole    No changes were made today   See you in 6 months

## 2017-10-09 LAB — TSH: TSH: 0.69 u[IU]/mL (ref 0.35–4.50)

## 2017-10-09 LAB — LIPID PANEL
Cholesterol: 131 mg/dL (ref 0–200)
HDL: 71.3 mg/dL (ref >39.00)
LDL Cholesterol: 41 mg/dL (ref 0–99)
NonHDL: 59.37
TRIGLYCERIDES: 94 mg/dL (ref 0.0–149.0)
Total CHOL/HDL Ratio: 2
VLDL: 18.8 mg/dL (ref 0.0–40.0)

## 2017-10-09 LAB — COMPREHENSIVE METABOLIC PANEL
ALK PHOS: 50 U/L (ref 39–117)
ALT: 10 U/L (ref 0–53)
AST: 14 U/L (ref 0–37)
Albumin: 3.9 g/dL (ref 3.5–5.2)
BUN: 16 mg/dL (ref 6–23)
CALCIUM: 9.2 mg/dL (ref 8.4–10.5)
CO2: 29 mEq/L (ref 19–32)
Chloride: 100 mEq/L (ref 96–112)
Creatinine, Ser: 1.42 mg/dL (ref 0.40–1.50)
GFR: 50.65 mL/min — AB (ref >60.00)
Glucose, Bld: 161 mg/dL — ABNORMAL HIGH (ref 70–99)
POTASSIUM: 4.9 meq/L (ref 3.5–5.1)
Sodium: 135 mEq/L (ref 135–145)
TOTAL PROTEIN: 6.2 g/dL (ref 6.0–8.3)
Total Bilirubin: 0.8 mg/dL (ref 0.2–1.2)

## 2017-10-09 NOTE — Assessment & Plan Note (Signed)
Well controlled on current regimen. Renal function stable, no changes today.  Lab Results  Component Value Date   CREATININE 1.42 10/08/2017   Lab Results  Component Value Date   NA 135 10/08/2017   K 4.9 10/08/2017   CL 100 10/08/2017   CO2 29 10/08/2017

## 2017-10-09 NOTE — Assessment & Plan Note (Signed)
Managed with Flomax

## 2017-10-09 NOTE — Assessment & Plan Note (Addendum)
Managed with methimazole.  TSH is < 1   When previously it was >1.5  May have missed doses. .  Will increase dose if not.   Lab Results  Component Value Date   TSH 0.69 10/08/2017

## 2017-10-09 NOTE — Assessment & Plan Note (Signed)
Resolved.  Work up for cause revealed that he was getting most of his caloric intake from wine, which he has continued to abstain from.

## 2017-10-09 NOTE — Assessment & Plan Note (Signed)
Managed with LAMA (spiriva)  And short acting beta agonist. He is no longer using Adviar or Dulera due to cost.

## 2017-10-09 NOTE — Assessment & Plan Note (Signed)
Stable control,  Previously  Managed by Dr [name].    Lab Results  Component Value Date   HGBA1C 6.2 (A) 10/08/2017   Lab Results  Component Value Date   MICROALBUR 12.2 (H) 03/12/2016

## 2017-10-12 ENCOUNTER — Other Ambulatory Visit: Payer: Self-pay | Admitting: Internal Medicine

## 2017-10-12 DIAGNOSIS — E0520 Thyrotoxicosis with toxic multinodular goiter without thyrotoxic crisis or storm: Secondary | ICD-10-CM

## 2017-10-12 DIAGNOSIS — E0590 Thyrotoxicosis, unspecified without thyrotoxic crisis or storm: Secondary | ICD-10-CM

## 2017-10-12 MED ORDER — CELECOXIB 200 MG PO CAPS: 200.0000 mg | ORAL_CAPSULE | Freq: Two times a day (BID) | ORAL | 0 refills | Status: DC

## 2017-10-12 MED ORDER — METHIMAZOLE 5 MG PO TABS: 5.0000 mg | ORAL_TABLET | Freq: Every day | ORAL | 2 refills | Status: DC

## 2017-10-15 ENCOUNTER — Telehealth: Payer: Self-pay

## 2017-10-15 NOTE — Telephone Encounter (Signed)
Copied from [place] [id number]. Topic: Quick Communication - See Telephone Encounter >> Oct 14, 2017  1:24 PM Hewitt Shorts wrote: Pt states he wants a call back from the office would not go into detail with me   Best number  [phone]

## 2017-10-17 NOTE — Telephone Encounter (Signed)
Called pt and he stated that he had called because his wife had fallen last week and wanted to see about getting a referral for an orthopedic for his wife, he did not want the pt to have to wait until today to see the orthopedic. Pt stated that his daughter got them an sooner appointment with a different orthopedic.

## 2017-10-24 ENCOUNTER — Other Ambulatory Visit: Payer: Self-pay | Admitting: Internal Medicine

## 2017-11-05 ENCOUNTER — Other Ambulatory Visit: Payer: Self-pay | Admitting: Internal Medicine

## 2017-11-07 ENCOUNTER — Ambulatory Visit: Payer: PPO

## 2017-11-25 ENCOUNTER — Other Ambulatory Visit: Payer: Self-pay | Admitting: Internal Medicine

## 2017-12-03 ENCOUNTER — Other Ambulatory Visit: Payer: Self-pay | Admitting: Internal Medicine

## 2018-01-04 ENCOUNTER — Other Ambulatory Visit: Payer: Self-pay | Admitting: Internal Medicine

## 2018-01-05 ENCOUNTER — Other Ambulatory Visit: Payer: Self-pay | Admitting: Internal Medicine

## 2018-01-27 ENCOUNTER — Ambulatory Visit: Payer: Self-pay

## 2018-01-27 NOTE — Telephone Encounter (Signed)
Pt. Reports he started coughing this morning.Has a sore throat as well, "I think from coughing." Denies fever. Reviewed home remedies with pt. Will Coricidin HBP. If symptoms persist, will call back.  Reason for Disposition . Cough  Answer Assessment - Initial Assessment Questions 1. ONSET: "When did the cough begin?"      This morning 2. SEVERITY: "How bad is the cough today?"       Moderate 3. RESPIRATORY DISTRESS: "Describe your breathing."      No distress 4. FEVER: "Do you have a fever?" If so, ask: "What is your temperature, how was it measured, and when did it start?"     No 5. HEMOPTYSIS: "Are you coughing up any blood?" If so ask: "How much?" (flecks, streaks, tablespoons, etc.)     No 6. TREATMENT: "What have you done so far to treat the cough?" (e.g., meds, fluids, humidifier)     Nothing 7. CARDIAC HISTORY: "Do you have any history of heart disease?" (e.g., heart attack, congestive heart failure)      No 8. LUNG HISTORY: "Do you have any history of lung disease?"  (e.g., pulmonary embolus, asthma, emphysema)     CODP 9. PE RISK FACTORS: "Do you have a history of blood clots?" (or: recent major surgery, recent prolonged travel, bedridden)     No 10. OTHER SYMPTOMS: "Do you have any other symptoms? (e.g., runny nose, wheezing, chest pain)       Sore throat, rattling in chest 11. PREGNANCY: "Is there any chance you are pregnant?" "When was your last menstrual period?"       n/a 12. TRAVEL: "Have you traveled out of the country in the last month?" (e.g., travel history, exposures)       No  Protocols used: COUGH - ACUTE NON-PRODUCTIVE-A-AH

## 2018-02-10 ENCOUNTER — Encounter: Payer: Self-pay | Admitting: Family Medicine

## 2018-02-10 ENCOUNTER — Ambulatory Visit (INDEPENDENT_AMBULATORY_CARE_PROVIDER_SITE_OTHER): Payer: PPO | Admitting: Family Medicine

## 2018-02-10 VITALS — BP 128/62 | HR 81 | Temp 98.1°F | Resp 18 | Ht 68.0 in | Wt 161.2 lb

## 2018-02-10 DIAGNOSIS — R05 Cough: Secondary | ICD-10-CM | POA: Diagnosis not present

## 2018-02-10 DIAGNOSIS — R0981 Nasal congestion: Secondary | ICD-10-CM | POA: Diagnosis not present

## 2018-02-10 DIAGNOSIS — R0982 Postnasal drip: Secondary | ICD-10-CM

## 2018-02-10 DIAGNOSIS — R059 Cough, unspecified: Secondary | ICD-10-CM

## 2018-02-10 MED ORDER — FLUTICASONE PROPIONATE 50 MCG/ACT NA SUSP: 2.0000 | Freq: Every day | NASAL | 6 refills | Status: DC

## 2018-02-10 NOTE — Progress Notes (Signed)
Subjective:    Patient ID: Terry [name], male    DOB: [birth year], 85 y.o.   MRN: [id number]  HPI   Patient presents to clinic complaining of cough, runny nose since late December.  Patient states he did a lot of traveling around Christmas, they flew back and forth to [place].  Patient states he has taken over-the-counter Mucinex with good effect.  Over the past week, he has slowly started to feel slightly better.  Still has a little bit of runny nose, dry cough.  Denies fever or chills.  Denies nausea/vomiting/diarrhea.  Denies shortness of breath or wheezing.  Denies chest pain.  Currently he does not use any sort of nasal spray or oral allergy medication.  Patient Active Problem List   Diagnosis Date Noted  . Left anterior knee pain 02/26/2017  . History of colonic polyps 08/07/2016  . Proteinuria 07/24/2016  . Weight loss, unintentional 07/24/2016  . Arm skin lesion, right 08/11/2015  . Bilateral carotid artery stenosis 05/31/2015  . 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 Travel advice encounter 01/17/2015  . Acquired bladder diverticulum 11/22/2013  . S/P appendectomy 11/22/2013  . Abdominal pain, bilateral lower quadrant 11/12/2013  . Enlarged prostate with urinary retention 11/12/2013  . B12 deficiency 07/20/2013  . Toxic multinodular goiter 07/20/2013  . Chest pain 12/12/2012  . Health care maintenance 11/12/2012  . COPD, moderate ([place]) 12/22/2011  . Hyperlipidemia with target LDL less than 70 06/28/2011  . Hypertension 06/28/2011  . Hyperthyroidism 06/28/2011  . Tubulovillous adenoma of colon 06/28/2011  . History of esophagitis 06/28/2011  . History of alcohol abuse 06/28/2011  . Type II diabetes mellitus ([place])   . Anemia    Social History   Tobacco Use  . Smoking status: Former Smoker    Packs/day: 2.00    Years: 50.00    Pack years: 100.00    Types: Cigarettes    Last attempt to quit: 06/26/1999    Years since quitting: 18.6  . Smokeless  tobacco: Never Used  Substance Use Topics  . Alcohol use: Yes    Alcohol/week: 25.0 standard drinks    Types: 25 Glasses of wine per week   Review of Systems  Constitutional: Negative for chills, fatigue and fever.  HENT: +runny nose. Negative ear pain, sinus pain and sore throat.   Eyes: Negative.   Respiratory: +dry cough. Negative for shortness of breath and wheezing.   Cardiovascular: Negative for chest pain, palpitations and leg swelling.  Gastrointestinal: Negative for abdominal pain, diarrhea, nausea and vomiting.  Genitourinary: Negative for dysuria, frequency and urgency.  Musculoskeletal: Negative for arthralgias and myalgias.  Skin: Negative for color change, pallor and rash.  Neurological: Negative for syncope, light-headedness and headaches.  Psychiatric/Behavioral: The patient is not nervous/anxious.       Objective:   Physical Exam  Constitutional: He appears well-developed and well-nourished. No distress.  HENT:  Head: Normocephalic and atraumatic. Ears: TMs and ear canals normal.  No wax. Nose/throat: No facial or maxillary sinus tenderness.  Mild nasal congestion with clear nasal drainage and postnasal drip.  No exudates or redness in back of throat. Eyes: Pupils are equal, round, and reactive to light. Conjunctivae and EOM are normal. No scleral icterus.  Neck: Normal range of motion. Neck supple. No tracheal deviation present.  Cardiovascular: Normal rate, regular rhythm and normal heart sounds.  Pulmonary/Chest: Effort normal and breath sounds normal. No respiratory distress. He has no wheezes. He has no rales.  Neurological: He  is alert and oriented to person, place, and time.  Gait normal  Skin: Skin is warm and dry. He is not diaphoretic. No pallor.  Psychiatric: He has a normal mood and affect. His behavior is normal.   Nursing note and vitals reviewed.   Vitals:   02/10/18 1103  BP: 128/62  Pulse: 81  Resp: 18  Temp: 98.1 F (36.7 C)  SpO2:  90%      Assessment & Plan:   Postnasal drip, nasal congestion, cough - patient will begin using Flonase nasal spray once per day to see if this helps resolve postnasal drip and congestion.  Also advised that he can trial a Claritin or Zyrtec once per day to see if this calms congestion symptoms (patient denies glaucoma history).  Patient advised that if needed he can continue use Mucinex to calm cough.  Lungs are clear on exam, so do not suspect any pneumonia or bronchitis.  Patient will keep regularly scheduled follow-up with PCP as planned.  Advised to return to clinic if symptoms do not continue to improve.  Also encourage patient to let us know that if he continues to have cough persisting over the next 2 weeks, because at that time he should have a chest x-ray to evaluate.

## 2018-02-10 NOTE — Progress Notes (Signed)
[id number] 

## 2018-02-18 DIAGNOSIS — L538 Other specified erythematous conditions: Secondary | ICD-10-CM | POA: Diagnosis not present

## 2018-02-18 DIAGNOSIS — L820 Inflamed seborrheic keratosis: Secondary | ICD-10-CM | POA: Diagnosis not present

## 2018-02-18 DIAGNOSIS — Z85828 Personal history of other malignant neoplasm of skin: Secondary | ICD-10-CM | POA: Diagnosis not present

## 2018-02-18 DIAGNOSIS — D2271 Melanocytic nevi of right lower limb, including hip: Secondary | ICD-10-CM | POA: Diagnosis not present

## 2018-02-18 DIAGNOSIS — D2261 Melanocytic nevi of right upper limb, including shoulder: Secondary | ICD-10-CM | POA: Diagnosis not present

## 2018-02-18 DIAGNOSIS — X32XXXA Exposure to sunlight, initial encounter: Secondary | ICD-10-CM | POA: Diagnosis not present

## 2018-02-18 DIAGNOSIS — D2262 Melanocytic nevi of left upper limb, including shoulder: Secondary | ICD-10-CM | POA: Diagnosis not present

## 2018-02-18 DIAGNOSIS — L570 Actinic keratosis: Secondary | ICD-10-CM | POA: Diagnosis not present

## 2018-02-24 ENCOUNTER — Encounter: Payer: Self-pay | Admitting: Internal Medicine

## 2018-02-24 ENCOUNTER — Ambulatory Visit (INDEPENDENT_AMBULATORY_CARE_PROVIDER_SITE_OTHER): Payer: PPO | Admitting: Internal Medicine

## 2018-02-24 VITALS — BP 130/60 | HR 74 | Temp 98.0°F | Resp 15 | Ht 68.0 in | Wt 165.8 lb

## 2018-02-24 DIAGNOSIS — N182 Chronic kidney disease, stage 2 (mild): Secondary | ICD-10-CM | POA: Diagnosis not present

## 2018-02-24 DIAGNOSIS — E0590 Thyrotoxicosis, unspecified without thyrotoxic crisis or storm: Secondary | ICD-10-CM

## 2018-02-24 DIAGNOSIS — M25421 Effusion, right elbow: Secondary | ICD-10-CM | POA: Diagnosis not present

## 2018-02-24 DIAGNOSIS — D649 Anemia, unspecified: Secondary | ICD-10-CM

## 2018-02-24 DIAGNOSIS — Z86010 Personal history of colonic polyps: Secondary | ICD-10-CM | POA: Diagnosis not present

## 2018-02-24 DIAGNOSIS — E1122 Type 2 diabetes mellitus with diabetic chronic kidney disease: Secondary | ICD-10-CM | POA: Diagnosis not present

## 2018-02-24 DIAGNOSIS — J449 Chronic obstructive pulmonary disease, unspecified: Secondary | ICD-10-CM

## 2018-02-24 MED ORDER — DOXYCYCLINE HYCLATE 100 MG PO TABS: 100.0000 mg | ORAL_TABLET | Freq: Two times a day (BID) | ORAL | 0 refills | Status: DC

## 2018-02-24 MED ORDER — BENAZEPRIL HCL 40 MG PO TABS: ORAL_TABLET | ORAL | 1 refills | Status: DC

## 2018-02-24 MED ORDER — PREDNISONE 10 MG PO TABS: ORAL_TABLET | ORAL | 0 refills | Status: DC

## 2018-02-24 MED ORDER — OMEPRAZOLE 40 MG PO CPDR: 40.0000 mg | DELAYED_RELEASE_CAPSULE | Freq: Every day | ORAL | 1 refills | Status: DC

## 2018-02-24 NOTE — Progress Notes (Signed)
Subjective:  Patient ID: Terry [name], male    DOB: 09-02-1933  Age: 85 y.o. MRN: [id number]  CC: The primary encounter diagnosis was Effusion of bursa of right elbow. Diagnoses of Type 2 diabetes mellitus with stage 2 chronic kidney disease, unspecified whether long term insulin use ([place]), Hyperthyroidism, COPD, moderate ([place]), History of colonic polyps, and Anemia, unspecified type were also pertinent to this visit.  HPI Terry [name] presents for EVALUATION OF RIGHT ELBOW PAIN AND SWELLING.   Patient states that he developed pain that occurs only with direct pressure on the elbow   Last week.  He also noted a fluid accumulation that was NOT accompanied by redness, warmth or bruising,  He does not recall any recent trauma . He had a similar episodes last year on the contralateral joint which required aspiration by orthopedics,  by [facility] .    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   COPD:  He reports a recent prolonged respiratory infection that occurred in December.symptoms included subjective fevers, productive cough,  And wheezing.   He self treated with OTC medications and his prescirbed MDIs (Spiriva and Combivent)  .  Symptoms lasted 3 weeks. He has received the annula influennza vaccine and 2 of the 3 recommended pneumonia vaccines.  He has had no recent international travel.          Outpatient Medications Prior to Visit  Medication Sig Dispense Refill  . aspirin EC 81 MG tablet Take 1 tablet (81 mg total) by mouth daily. 90 tablet 3  . b complex vitamins tablet Take 1 tablet by mouth daily.    . celecoxib (CELEBREX) 200 MG capsule TAKE 1 CAPSULE (200 MG TOTAL) BY MOUTH 2 (TWO) TIMES DAILY. FOR ONE WEEK, THEN ONCE DAILY THEREAFTER 37 capsule 0  . Cholecalciferol (VITAMIN D3) 1000 UNITS CAPS Take by mouth.    . COMBIVENT RESPIMAT 20-100  MCG/ACT AERS respimat Use as directed 1 puff in the mouth or throat 4 (four) times daily as needed.  2  . cyanocobalamin 1000 MCG tablet Take 1,000 mcg by mouth daily.    . fluticasone (FLONASE) 50 MCG/ACT nasal spray Place 2 sprays into both nostrils daily. 16 g 6  . metFORMIN (GLUCOPHAGE-XR) 500 MG 24 hr tablet Take 500 mg by mouth daily.    . methimazole (TAPAZOLE) 5 MG tablet TAKE 1 TABLET BY MOUTH EVERY DAY 90 tablet 0  . metoprolol succinate (TOPROL-XL) 50 MG 24 hr tablet TAKE 1 TABLET (50 MG TOTAL) BY MOUTH DAILY. 90 tablet 1  . sildenafil (REVATIO) 20 MG tablet Take 1 tablet (20 mg total) by mouth 3 (three) times daily. 50 tablet 5  . simvastatin (ZOCOR) 40 MG tablet TAKE 1 TABLET BY MOUTH EVERYDAY AT BEDTIME 90 tablet 1  . tamsulosin (FLOMAX) 0.4 MG CAPS capsule TAKE 1 CAPSULE BY MOUTH EVERY DAY 90 capsule 1  . Tiotropium Bromide Monohydrate (SPIRIVA RESPIMAT IN) Inhale 1 spray into the lungs daily.    . benazepril (LOTENSIN) 40 MG tablet TAKE 1 TABLET (40 MG TOTAL) BY MOUTH DAILY. 90 tablet 1  . omeprazole (PRILOSEC) 40 MG capsule Take 1 capsule (40 mg total) by mouth daily. 30 capsule 5   Facility-Administered Medications Prior to Visit  Medication Dose Route Frequency Provider Last Rate Last Dose  . ipratropium-albuterol (DUONEB) 0.5-2.5 (3)  MG/3ML nebulizer solution 3 mL  3 mL Nebulization Q6H Burnard Hawthorne, FNP        Review of Systems;  Patient denies headache, fevers, malaise, unintentional weight loss, skin rash, eye pain, sinus congestion and sinus pain, sore throat, dysphagia,  hemoptysis , cough, dyspnea, wheezing, chest pain, palpitations, orthopnea, edema, abdominal pain, nausea, melena, diarrhea, constipation, flank pain, dysuria, hematuria, urinary  Frequency, nocturia, numbness, tingling, seizures,  Focal weakness, Loss of consciousness,  Tremor, insomnia, depression, anxiety, and suicidal ideation.      Objective:  BP 130/60 (BP Location: Left Arm, Patient  Position: Sitting, Cuff Size: Normal)   Pulse 74   Temp 98 F (36.7 C) (Oral)   Resp 15   Ht 5\' 8"  (1.727 m)   Wt 165 lb 12.8 oz (75.2 kg)   SpO2 95%   BMI 25.21 kg/m   BP Readings from Last 3 Encounters:  02/24/18 130/60  02/10/18 128/62  10/08/17 136/64    Wt Readings from Last 3 Encounters:  02/24/18 165 lb 12.8 oz (75.2 kg)  02/10/18 161 lb 3.2 oz (73.1 kg)  10/08/17 166 lb 9.6 oz (75.6 kg)    General appearance: thin, alert, cooperative and appears stated age Throat: lips, mucosa, and tongue normal; teeth and gums normal Neck: no adenopathy, no carotid bruit, supple, symmetrical, trachea midline and thyroid not enlarged, symmetric, no tenderness/mass/nodules Back: symmetric, no curvature. ROM normal. No CVA tenderness. Lungs: clear to auscultation bilaterally, no wheezing, fair air movement. Speaks in short sentences due to baseline dyspnea .  Not dyspneic at rest   Heart: regular rate and rhythm, S1, S2 normal, no murmur, click, rub or gallop Abdomen: soft, non-tender; bowel sounds normal; no masses,  no organomegaly Pulses: 2+ and symmetric Ext: right elbow with large olecranon effusion,  withotu erythema , bruising or warmth.   Skin: Skin color, texture, turgor normal. No rashes or lesions Lymph nodes: Cervical, supraclavicular, and axillary nodes normal.  Lab Results  Component Value Date   HGBA1C 6.2 (A) 10/08/2017   HGBA1C 7.0 (H) 02/25/2017   HGBA1C 6.8 (H) 03/12/2016    Lab Results  Component Value Date   CREATININE 1.42 10/08/2017   CREATININE 1.3 05/02/2017   CREATININE 1.56 (H) 02/25/2017    Lab Results  Component Value Date   WBC 7.0 05/02/2017   HGB 11.3 (A) 05/02/2017   HCT 34 (A) 05/02/2017   PLT 298 05/02/2017   GLUCOSE 161 (H) 10/08/2017   CHOL 131 10/08/2017   TRIG 94.0 10/08/2017   HDL 71.30 10/08/2017   LDLDIRECT 49.0 03/12/2016   LDLCALC 41 10/08/2017   ALT 10 10/08/2017   AST 14 10/08/2017   NA 135 10/08/2017   K 4.9  10/08/2017   CL 100 10/08/2017   CREATININE 1.42 10/08/2017   BUN 16 10/08/2017   CO2 29 10/08/2017   TSH 0.69 10/08/2017   PSA 0.75 11/12/2013   INR 0.9 12/12/2012   HGBA1C 6.2 (A) 10/08/2017   MICROALBUR 12.2 (H) 03/12/2016    Dg Chest 2 View  Result Date: 10/23/2016 CLINICAL DATA:  Right lower chest pain for 2 months, some weight loss, history of COPD EXAM: CHEST  2 VIEW COMPARISON:  Chest x-ray of 10/02/2012 FINDINGS: The lungs remain clear but hyperaerated suggestive of mild emphysematous change. Mediastinal and hilar contours are unremarkable. Minimal basilar linear scarring is present. No pneumonia or effusion is seen. The heart is within normal limits in size. Only mild degenerative changes present in the lower thoracic  spine for age. IMPRESSION: Hyperaeration may indicate mild emphysematous change. No active lung disease. Electronically Signed   By: Ivar Drape M.D.   On: 10/23/2016 08:39   US Abdomen Complete  Result Date: 10/23/2016 CLINICAL DATA:  Weight loss, abdominal pain EXAM: ABDOMEN ULTRASOUND COMPLETE COMPARISON:  CT 08/15/2016 FINDINGS: Gallbladder: No gallstones or wall thickening visualized. No sonographic Murphy sign noted by sonographer. Common bile duct: Diameter: Normal caliber, 3 mm. Liver: No focal lesion identified. Within normal limits in parenchymal echogenicity. Portal vein is patent on color Doppler imaging with normal direction of blood flow towards the liver. IVC: No abnormality visualized. Pancreas: Not well visualized due to overlying bowel gas. Spleen: Size and appearance within normal limits. Right Kidney: Length: 11.0 cm. 2.7 cm cyst off the lower pole. No hydronephrosis. Normal echotexture. Left Kidney: Length: 11.3 cm. Echogenicity within normal limits. No mass or hydronephrosis visualized. Abdominal aorta: No aneurysm visualized. Other findings: None. IMPRESSION: No acute findings or significant abnormality. Electronically Signed   By: Rolm Baptise M.D.    On: 10/23/2016 09:13    Assessment & Plan:   Problem List Items Addressed This Visit    Anemia    Mild normocytic,  Chronic.  Normal colonosocpy in 2018,  EGD noted gastritis and PPI was increased to PPI,  Alcohol abuse was also addressed and he has remained abstinent. Repeat labs to be done       Relevant Orders   CBC with Differential/Platelet   Iron, TIBC and Ferritin Panel   COPD, moderate ([place])    Patient has not followed up with Pulmonary in quite a while.  nt oxygen dependent,  Recent URi reviewed with patient and emphasized need for  Aggressive treatment of all URI's.  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 Last colonoscopy in 2018 (diagnostic,  Due to weight loss) was normal.  No further screening advised due to age.       Hyperthyroidism    Previously managed by Dr [name],  [facility],  with methimazole, until her departure.  Last  TSH iwas < 1   When previously it was >1.5  May have missed doses. Repeat TSH is due  Lab Results  Component Value Date   TSH 0.69 10/08/2017         Relevant Orders   TSH   T4, free   Type II diabetes mellitus ([employer])    Previously managed by Dr [name] prior to her departure.   He will return for fasting labs   Lab Results  Component Value Date   HGBA1C 6.2 (A) 10/08/2017         Relevant Medications   benazepril (LOTENSIN) 40 MG tablet   Other Relevant Orders   Hemoglobin A1c   Comprehensive metabolic panel   Lipid panel   Urine Microscopic Only   Microalbumin / creatinine urine ratio    Other Visit Diagnoses    Effusion of bursa of right elbow    -  Primary   Relevant Orders   Ambulatory referral to Orthopedic Surgery      I am having Dub Mikes D. Theodoro Clock" start on predniSONE and doxycycline. I am also  having him maintain his Vitamin D3, cyanocobalamin, b complex vitamins, COMBIVENT RESPIMAT, Tiotropium Bromide Monohydrate (SPIRIVA  RESPIMAT IN), aspirin EC, metFORMIN, sildenafil, simvastatin, tamsulosin, celecoxib, metoprolol succinate, methimazole, fluticasone, benazepril, and omeprazole. We will continue to administer ipratropium-albuterol.  Meds ordered this encounter  Medications  . benazepril (LOTENSIN) 40 MG tablet    Sig: TAKE 1 TABLET (40 MG TOTAL) BY MOUTH DAILY.    Dispense:  90 tablet    Refill:  1  . omeprazole (PRILOSEC) 40 MG capsule    Sig: Take 1 capsule (40 mg total) by mouth daily.    Dispense:  90 capsule    Refill:  1  . predniSONE (DELTASONE) 10 MG tablet    Sig: 6 tablets daily for 3 days,  then reduce by 1 tablet daily until gone    Dispense:  33 tablet    Refill:  0  . doxycycline (VIBRA-TABS) 100 MG tablet    Sig: Take 1 tablet (100 mg total) by mouth 2 (two) times daily.    Dispense:  14 tablet    Refill:  0    A total of 50 minutes of face to face time was spent with patient more than half of which was spent in counselling about the above mentioned conditions  and coordination of care   Medications Discontinued During This Encounter  Medication Reason  . benazepril (LOTENSIN) 40 MG tablet Reorder  . omeprazole (PRILOSEC) 40 MG capsule Reorder    Follow-up: Return in about 3 months (around 05/26/2018) for follow up diabetes.   [name], MD

## 2018-02-24 NOTE — Patient Instructions (Addendum)
To prevent communicable respiratory infections:  You should consider irrigating  Your sinuses daily with  [employer]'s Sinus rinse ;  It is a stong sinus "flush" using water and medicated salts.  Do it over the sink because it can be a bit messy  I recommend that if you develop another respiratory  infection, t you start taking prednisone and doxycycline,  And I have sent those prescriptions to your office,  AND make an appointment to see me (but do not wait;  Start the medication)  ALWAYS  take a probiotic ( Align, Floraque or Culturelle) , or the generic version of one of these  For a minimum of 3 weeks to prevent a serious antibiotic associated diarrhea  Called clostridium dificile colitis    I am making a referral to [facility] to drain the EFFUSION on your right elbow

## 2018-02-25 NOTE — Assessment & Plan Note (Addendum)
Previously managed by Dr [name],  [facility],  with methimazole, until her departure.  Last  TSH iwas < 1   When previously it was >1.5  May have missed doses. Repeat TSH is due  Lab Results  Component Value Date   TSH 0.69 10/08/2017

## 2018-02-25 NOTE — Assessment & Plan Note (Addendum)
Patient has not followed up with Pulmonary in quite a while.  nt oxygen dependent,  Recent URi reviewed with patient and emphasized need for  Aggressive treatment of all URI's.  Given rx for prednisone and doxycycline to start with next event and encouraged to call for follow up

## 2018-02-25 NOTE — Assessment & Plan Note (Signed)
Previously managed by Dr [name] prior to her departure.   He will return for fasting labs   Lab Results  Component Value Date   HGBA1C 6.2 (A) 10/08/2017

## 2018-02-25 NOTE — Assessment & Plan Note (Signed)
Mild normocytic,  Chronic.  Normal colonosocpy in 2018,  EGD noted gastritis and PPI was increased to PPI,  Alcohol abuse was also addressed and he has remained abstinent. Repeat labs to be done

## 2018-02-25 NOTE — Assessment & Plan Note (Signed)
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 Last colonoscopy in 2018 (diagnostic,  Due to weight loss) was normal.  No further screening advised due to age.

## 2018-03-04 ENCOUNTER — Other Ambulatory Visit: Payer: Self-pay | Admitting: Internal Medicine

## 2018-03-04 MED ORDER — METFORMIN HCL ER 500 MG PO TB24: 500.0000 mg | ORAL_TABLET | Freq: Every day | ORAL | 1 refills | Status: DC

## 2018-03-17 DIAGNOSIS — M7021 Olecranon bursitis, right elbow: Secondary | ICD-10-CM | POA: Diagnosis not present

## 2018-03-18 ENCOUNTER — Ambulatory Visit (INDEPENDENT_AMBULATORY_CARE_PROVIDER_SITE_OTHER): Payer: PPO

## 2018-03-18 VITALS — BP 132/64 | HR 76 | Temp 97.4°F | Resp 16 | Ht 71.5 in | Wt 167.4 lb

## 2018-03-18 DIAGNOSIS — Z0000 Encounter for general adult medical examination without abnormal findings: Secondary | ICD-10-CM

## 2018-03-18 NOTE — Progress Notes (Signed)
Subjective:   Terry [name] is a 85 y.o. male who presents for Medicare Annual/Subsequent preventive examination.  Review of Systems:  No ROS.  Medicare Wellness Visit. Additional risk factors are reflected in the social history. Cardiac Risk Factors include: advanced age (>21men, >38 women);diabetes mellitus;hypertension;male gender     Objective:    Vitals: BP 132/64 (BP Location: Left Arm, Patient Position: Sitting, Cuff Size: Normal)   Pulse 76   Temp (!) 97.4 F (36.3 C) (Oral)   Resp 16   Ht 5' 11.5" (1.816 m)   Wt 167 lb 6.4 oz (75.9 kg)   SpO2 91%   BMI 23.02 kg/m   Body mass index is 23.02 kg/m.  Advanced Directives 03/18/2018 11/06/2016 09/19/2016  Does Patient Have a Medical Advance Directive? Yes Yes No  Type of Advance Directive Living will [place];Living will -  Does patient want to make changes to medical advance directive? No - Patient declined No - Patient declined -  Copy of [place] in Chart? - No - copy requested -  Would patient like information on creating a medical advance directive? - - No - Patient declined    Tobacco Social History   Tobacco Use  Smoking Status Former Smoker  . Packs/day: 2.00  . Years: 50.00  . Pack years: 100.00  . Types: Cigarettes  . Last attempt to quit: 06/26/1999  . Years since quitting: 18.7  Smokeless Tobacco Never Used     Counseling given: Not Answered   Clinical Intake:  Pre-visit preparation completed: Yes  Pain : No/denies pain     Diabetes: Yes(Followed by pcp)  How often do you need to have someone help you when you read instructions, pamphlets, or other written materials from your doctor or pharmacy?: 1 - Never  Interpreter Needed?: No     Past Medical History:  Diagnosis Date  . Anemia   . COPD (chronic obstructive pulmonary disease) ([place] [place] [address])   . Diabetes mellitus 2008  . GERD (gastroesophageal reflux disease)   . Hyperlipidemia   . Hypertension     . hyperthyroidism   . Squamous cell carcinoma of skin of left upper arm November 2015   Excised to negative margins, associated keratoacanthoma.   Past Surgical History:  Procedure Laterality Date  . APPENDECTOMY  1970  . ARM SKIN LESION BIOPSY / EXCISION Left 2016  . CATARACT EXTRACTION, BILATERAL  2012   [facility]  . COLONOSCOPY  12-10-12  . COLONOSCOPY W/ POLYPECTOMY  2011   Dr. Raina Mina  . COLONOSCOPY WITH PROPOFOL N/A 09/19/2016   Procedure: COLONOSCOPY WITH PROPOFOL;  Surgeon: Robert Bellow, MD;  Location: [facility] ENDOSCOPY;  Service: Endoscopy;  Laterality: N/A;  . ESOPHAGOGASTRODUODENOSCOPY (EGD) WITH PROPOFOL N/A 09/19/2016   Procedure: ESOPHAGOGASTRODUODENOSCOPY (EGD) WITH PROPOFOL;  Surgeon: Robert Bellow, MD;  Location: [facility] ENDOSCOPY;  Service: Endoscopy;  Laterality: N/A;   Family History  Problem Relation Age of Onset  . Hypertension Father   . Diabetes Maternal Aunt   . Hyperlipidemia Maternal Uncle   . Birth defects Neg Hx    Social History   Socioeconomic History  . Marital status: Married    Spouse name: Not on file  . Number of children: Not on file  . Years of education: Not on file  . Highest education level: Not on file  Occupational History  . Not on file  Social Needs  . Financial resource strain: Not hard at all  . Food insecurity:  Worry: Never true    Inability: Never true  . Transportation needs:    Medical: No    Non-medical: No  Tobacco Use  . Smoking status: Former Smoker    Packs/day: 2.00    Years: 50.00    Pack years: 100.00    Types: Cigarettes    Last attempt to quit: 06/26/1999    Years since quitting: 18.7  . Smokeless tobacco: Never Used  Substance and Sexual Activity  . Alcohol use: Yes    Alcohol/week: 25.0 standard drinks    Types: 25 Glasses of wine per week  . Drug use: No  . Sexual activity: Yes  Lifestyle  . Physical activity:    Days per week: 5 days    Minutes per session: 20 min  . Stress: Not at all   Relationships  . Social connections:    Talks on phone: Not on file    Gets together: Not on file    Attends religious service: Not on file    Active member of club or organization: Not on file    Attends meetings of clubs or organizations: Not on file    Relationship status: Not on file  Other Topics Concern  . Not on file  Social History Narrative  . Not on file    Outpatient Encounter Medications as of 03/18/2018  Medication Sig  . aspirin EC 81 MG tablet Take 1 tablet (81 mg total) by mouth daily.  Marland Kitchen b complex vitamins tablet Take 1 tablet by mouth daily.  . benazepril (LOTENSIN) 40 MG tablet TAKE 1 TABLET (40 MG TOTAL) BY MOUTH DAILY.  Marland Kitchen COMBIVENT RESPIMAT 20-100 MCG/ACT AERS respimat Use as directed 1 puff in the mouth or throat 4 (four) times daily as needed.  . cyanocobalamin 1000 MCG tablet Take 1,000 mcg by mouth daily.  . metFORMIN (GLUCOPHAGE-XR) 500 MG 24 hr tablet Take 1 tablet (500 mg total) by mouth daily.  . methimazole (TAPAZOLE) 5 MG tablet TAKE 1 TABLET BY MOUTH EVERY DAY  . metoprolol succinate (TOPROL-XL) 50 MG 24 hr tablet TAKE 1 TABLET (50 MG TOTAL) BY MOUTH DAILY.  Marland Kitchen omeprazole (PRILOSEC) 40 MG capsule Take 1 capsule (40 mg total) by mouth daily.  . sildenafil (REVATIO) 20 MG tablet Take 1 tablet (20 mg total) by mouth 3 (three) times daily.  . simvastatin (ZOCOR) 40 MG tablet TAKE 1 TABLET BY MOUTH EVERYDAY AT BEDTIME  . Tiotropium Bromide Monohydrate (SPIRIVA RESPIMAT IN) Inhale 1 spray into the lungs daily.  . [DISCONTINUED] celecoxib (CELEBREX) 200 MG capsule TAKE 1 CAPSULE (200 MG TOTAL) BY MOUTH 2 (TWO) TIMES DAILY. FOR ONE WEEK, THEN ONCE DAILY THEREAFTER  . [DISCONTINUED] Cholecalciferol (VITAMIN D3) 1000 UNITS CAPS Take by mouth.  . [DISCONTINUED] doxycycline (VIBRA-TABS) 100 MG tablet Take 1 tablet (100 mg total) by mouth 2 (two) times daily.  . [DISCONTINUED] fluticasone (FLONASE) 50 MCG/ACT nasal spray Place 2 sprays into both nostrils daily.  .  [DISCONTINUED] predniSONE (DELTASONE) 10 MG tablet 6 tablets daily for 3 days,  then reduce by 1 tablet daily until gone  . [DISCONTINUED] tamsulosin (FLOMAX) 0.4 MG CAPS capsule TAKE 1 CAPSULE BY MOUTH EVERY DAY   Facility-Administered Encounter Medications as of 03/18/2018  Medication  . ipratropium-albuterol (DUONEB) 0.5-2.5 (3) MG/3ML nebulizer solution 3 mL    Activities of Daily Living In your present state of health, do you have any difficulty performing the following activities: 03/18/2018  Hearing? N  Vision? N  Difficulty concentrating or making  decisions? N  Walking or climbing stairs? N  Dressing or bathing? N  Doing errands, shopping? N  Preparing Food and eating ? N  Using the Toilet? N  In the past six months, have you accidently leaked urine? N  Do you have problems with loss of bowel control? N  Managing your Medications? N  Managing your Finances? N  Housekeeping or managing your Housekeeping? N  Some recent data might be hidden    Patient Care Team: [name], MD as PCP - General (Internal Medicine) Bary Castilla, Forest Gleason, MD (General Surgery) [name], MD (Internal Medicine) Wellington Hampshire, MD as Consulting Physician (Cardiology)   Assessment:   This is a routine wellness examination for [place].  Return for fasting labs per physician note. Labs have already been placed.   Diabetes- He checks his feet regularly for infection. Patient does not walk barefoot outside and denies any numbness, tingling, or burning in feet.   Health Screenings  Colonoscopy -09/19/16 Glaucoma -none Hearing -demonstrates normal hearing during conversation. Hemoglobin A1C -10/08/17 (6.2) Cholesterol -10/08/17 (131) TSH- 10/08/17 (0.69) Dental- dentures Vision- 08/2017  Social  Alcohol intake- yes, 25 glasses of wine per week. Educational information provided for recommended for male.  Smoking history- former Smokers in home? none Illicit drug use? none Exercise  -walking  Diet -low glycemic Sexually Active -yes Multiple Partners -no  Safety  Patient feels safe at home.  Patient does have smoke detectors at home. Patient does wear sunscreen or protective clothing when in direct sunlight  Patient does wear seat belt when driving or riding with others.   Activities of Daily Living Patient can do their own household chores. Denies needing assistance with: driving, feeding themselves, getting from bed to chair, getting to the toilet, bathing/showering, dressing, managing money, climbing flight of stairs, or preparing meals.   Depression Screen Patient denies losing interest in daily life, feeling hopeless, or crying easily over simple problems.   Fall Screen Patient denies being afraid of falling or falling in the last year.   Memory Screen Patient denies problems with memory, misplacing items, and is able to balance checkbook/bank accounts.  Patient is alert, normal appearance, oriented to person/place/and time. Correctly identified the president of the [place], recall of 2/3 objects, and performing simple calculations.  Patient displays appropriate judgement and can read correct time from watch face.   Immunizations The following Immunizations are up to date: Influenza,  pneumonia, and tetanus. Shingles discussed.  Other Providers Patient Care Team: [name], MD as PCP - General (Internal Medicine) Bary Castilla, Forest Gleason, MD (General Surgery) [name], MD (Internal Medicine) Wellington Hampshire, MD as Consulting Physician (Cardiology)  Exercise Activities and Dietary recommendations Current Exercise Habits: Home exercise routine, Type of exercise: walking, Time (Minutes): 20, Frequency (Times/Week): 4, Weekly Exercise (Minutes/Week): 80, Intensity: Mild  Goals      Patient Stated   . Gain weight (pt-stated)       Fall Risk Fall Risk  03/18/2018 11/06/2016 07/18/2016 01/02/2016  Falls in the past year? 0 No No No   Depression  Screen PHQ 2/9 Scores 03/18/2018 11/06/2016 07/18/2016 01/02/2016  PHQ - 2 Score 0 0 0 0   Cognitive Function MMSE - Mini Mental State Exam 11/06/2016  Orientation to time 5  Orientation to Place 5  Registration 3  Attention/ Calculation 5  Recall 3  Language- name 2 objects 2  Language- repeat 1  Language- follow 3 step command 3  Language- read &  follow direction 1  Write a sentence 1  Copy design 1  Total score 30     6CIT Screen 03/18/2018  What Year? 0 points  What month? 0 points  What time? 0 points  Count back from 20 0 points  Months in reverse 0 points  Repeat phrase 0 points  Total Score 0    Immunization History  Administered Date(s) Administered  . Influenza, High Dose Seasonal PF 01/17/2015, 11/06/2016, 10/08/2017  . Influenza,inj,Quad PF,6+ Mos 10/02/2012, 11/12/2013  . Influenza-Unspecified 10/30/2015  . Pneumococcal Conjugate-13 11/12/2013  . Pneumococcal Polysaccharide-23 10/02/2012  . Tdap 02/25/2013   Screening Tests Health Maintenance  Topic Date Due  . FOOT EXAM  03/12/2017  . OPHTHALMOLOGY EXAM  05/25/2017  . HEMOGLOBIN A1C  04/08/2018  . TETANUS/TDAP  02/26/2023  . INFLUENZA VACCINE  Completed  . PNA vac Low Risk Adult  Completed     Plan:    End of life planning; Advance aging; Advanced directives discussed. Copy of current HCPOA/Living Will requested.    I have personally reviewed and noted the following in the patient's chart:   . Medical and social history . Use of alcohol, tobacco or illicit drugs  . Current medications and supplements . Functional ability and status . Nutritional status . Physical activity . Advanced directives . List of other physicians . Hospitalizations, surgeries, and ER visits in previous 12 months . Vitals . Screenings to include cognitive, depression, and falls . Referrals and appointments  In addition, I have reviewed and discussed with patient certain preventive protocols, quality metrics, and best  practice recommendations. A written personalized care plan for preventive services as well as general preventive health recommendations were provided to patient.     Varney Biles, LPN  06/08/2583   Reviewed above information.  Agree with assessment and plan.    Dr Nicki Reaper

## 2018-03-18 NOTE — Patient Instructions (Addendum)
  Mr. Bartoli , Thank you for taking time to come for your Medicare Wellness Visit. I appreciate your ongoing commitment to your health goals. Please review the following plan we discussed and let me know if I can assist you in the future.   Follow up as needed.    Bring a copy of your [employer] and/or Living Will to be scanned into chart.  Have a great day!  These are the goals we discussed: Goals      Patient Stated   . Gain weight (pt-stated)       This is a list of the screening recommended for you and due dates:  Health Maintenance  Topic Date Due  . Complete foot exam   03/12/2017  . Eye exam for diabetics  05/25/2017  . Hemoglobin A1C  04/08/2018  . Tetanus Vaccine  02/26/2023  . Flu Shot  Completed  . Pneumonia vaccines  Completed

## 2018-03-24 ENCOUNTER — Other Ambulatory Visit (INDEPENDENT_AMBULATORY_CARE_PROVIDER_SITE_OTHER): Payer: PPO

## 2018-03-24 DIAGNOSIS — D649 Anemia, unspecified: Secondary | ICD-10-CM | POA: Diagnosis not present

## 2018-03-24 DIAGNOSIS — E0590 Thyrotoxicosis, unspecified without thyrotoxic crisis or storm: Secondary | ICD-10-CM

## 2018-03-24 DIAGNOSIS — E1122 Type 2 diabetes mellitus with diabetic chronic kidney disease: Secondary | ICD-10-CM | POA: Diagnosis not present

## 2018-03-24 DIAGNOSIS — N182 Chronic kidney disease, stage 2 (mild): Secondary | ICD-10-CM | POA: Diagnosis not present

## 2018-03-24 LAB — CBC WITH DIFFERENTIAL/PLATELET
Basophils Absolute: 0.1 10*3/uL (ref 0.0–0.1)
Basophils Relative: 0.6 % (ref 0.0–3.0)
Eosinophils Absolute: 0.2 10*3/uL (ref 0.0–0.7)
Eosinophils Relative: 1.9 % (ref 0.0–5.0)
HCT: 34.1 % — ABNORMAL LOW (ref 39.0–52.0)
Hemoglobin: 11.5 g/dL — ABNORMAL LOW (ref 13.0–17.0)
Lymphocytes Relative: 26.5 % (ref 12.0–46.0)
Lymphs Abs: 2.4 10*3/uL (ref 0.7–4.0)
MCHC: 33.6 g/dL (ref 30.0–36.0)
MCV: 92.5 fl (ref 78.0–100.0)
MONO ABS: 0.8 10*3/uL (ref 0.1–1.0)
Monocytes Relative: 9.1 % (ref 3.0–12.0)
NEUTROS ABS: 5.5 10*3/uL (ref 1.4–7.7)
Neutrophils Relative %: 61.9 % (ref 43.0–77.0)
Platelets: 315 10*3/uL (ref 150.0–400.0)
RBC: 3.68 Mil/uL — ABNORMAL LOW (ref 4.22–5.81)
RDW: 14.6 % (ref 11.5–15.5)
WBC: 8.9 10*3/uL (ref 4.0–10.5)

## 2018-03-24 LAB — COMPREHENSIVE METABOLIC PANEL
ALK PHOS: 61 U/L (ref 39–117)
ALT: 22 U/L (ref 0–53)
AST: 25 U/L (ref 0–37)
Albumin: 3.8 g/dL (ref 3.5–5.2)
BUN: 15 mg/dL (ref 6–23)
CHLORIDE: 101 meq/L (ref 96–112)
CO2: 26 mEq/L (ref 19–32)
Calcium: 9.3 mg/dL (ref 8.4–10.5)
Creatinine, Ser: 1.16 mg/dL (ref 0.40–1.50)
GFR: 60.11 mL/min (ref >60.00)
Glucose, Bld: 109 mg/dL — ABNORMAL HIGH (ref 70–99)
POTASSIUM: 4.8 meq/L (ref 3.5–5.1)
Sodium: 136 mEq/L (ref 135–145)
TOTAL PROTEIN: 6.4 g/dL (ref 6.0–8.3)
Total Bilirubin: 0.7 mg/dL (ref 0.2–1.2)

## 2018-03-24 LAB — URINALYSIS, MICROSCOPIC ONLY: WBC, UA: NONE SEEN (ref 0–?)

## 2018-03-24 LAB — LIPID PANEL
Cholesterol: 139 mg/dL (ref 0–200)
HDL: 70.8 mg/dL (ref >39.00)
LDL Cholesterol: 49 mg/dL (ref 0–99)
NonHDL: 68.38
Total CHOL/HDL Ratio: 2
Triglycerides: 95 mg/dL (ref 0.0–149.0)
VLDL: 19 mg/dL (ref 0.0–40.0)

## 2018-03-24 LAB — MICROALBUMIN / CREATININE URINE RATIO
Creatinine,U: 53.9 mg/dL
Microalb Creat Ratio: 33.7 mg/g — ABNORMAL HIGH (ref 0.0–30.0)
Microalb, Ur: 18.1 mg/dL — ABNORMAL HIGH (ref 0.0–1.9)

## 2018-03-24 LAB — HEMOGLOBIN A1C: Hgb A1c MFr Bld: 6.6 % — ABNORMAL HIGH (ref 4.6–6.5)

## 2018-03-24 LAB — T4, FREE: Free T4: 0.72 ng/dL (ref 0.60–1.60)

## 2018-03-24 LAB — TSH: TSH: 6.13 u[IU]/mL — AB (ref 0.35–4.50)

## 2018-03-25 LAB — IRON,TIBC AND FERRITIN PANEL
%SAT: 37 % (calc) (ref 20–48)
Ferritin: 269 ng/mL (ref 24–380)
Iron: 97 ug/dL (ref 50–180)
TIBC: 264 mcg/dL (calc) (ref 250–425)

## 2018-04-08 ENCOUNTER — Ambulatory Visit: Payer: PPO | Admitting: Internal Medicine

## 2018-04-25 ENCOUNTER — Encounter: Payer: Self-pay | Admitting: Internal Medicine

## 2018-04-25 ENCOUNTER — Other Ambulatory Visit: Payer: Self-pay

## 2018-04-25 ENCOUNTER — Ambulatory Visit (INDEPENDENT_AMBULATORY_CARE_PROVIDER_SITE_OTHER): Payer: PPO | Admitting: Internal Medicine

## 2018-04-25 VITALS — BP 118/62 | HR 77 | Temp 97.5°F | Resp 15 | Ht 71.5 in | Wt 165.0 lb

## 2018-04-25 DIAGNOSIS — E559 Vitamin D deficiency, unspecified: Secondary | ICD-10-CM

## 2018-04-25 DIAGNOSIS — E785 Hyperlipidemia, unspecified: Secondary | ICD-10-CM | POA: Diagnosis not present

## 2018-04-25 DIAGNOSIS — E119 Type 2 diabetes mellitus without complications: Secondary | ICD-10-CM

## 2018-04-25 DIAGNOSIS — E538 Deficiency of other specified B group vitamins: Secondary | ICD-10-CM | POA: Diagnosis not present

## 2018-04-25 DIAGNOSIS — E1121 Type 2 diabetes mellitus with diabetic nephropathy: Secondary | ICD-10-CM | POA: Diagnosis not present

## 2018-04-25 DIAGNOSIS — I10 Essential (primary) hypertension: Secondary | ICD-10-CM

## 2018-04-25 DIAGNOSIS — J449 Chronic obstructive pulmonary disease, unspecified: Secondary | ICD-10-CM

## 2018-04-25 DIAGNOSIS — E0590 Thyrotoxicosis, unspecified without thyrotoxic crisis or storm: Secondary | ICD-10-CM

## 2018-04-25 MED ORDER — TELMISARTAN 80 MG PO TABS: 80.0000 mg | ORAL_TABLET | Freq: Every day | ORAL | 1 refills | Status: DC

## 2018-04-25 MED ORDER — ZOSTER VAC RECOMB ADJUVANTED 50 MCG/0.5ML IM SUSR: 0.5000 mL | Freq: Once | INTRAMUSCULAR | 1 refills | Status: AC

## 2018-04-25 NOTE — Patient Instructions (Addendum)
  Your cholesterol, liver and kidney function are normal.   Your thyroid is borderline underactive.  I will  repeat the thyroid test in 3 months to be sure it is not trending any more    your hemoglobin has improved  to 11.5 , (normal is above 12) and  your iron stores are  Normal   I am making a decision to change  benazepril to telmisartan, based on increased  reports of life threateneng angioedema with ACE Inhibitors .  I also advise you  to take it at night instead of morning,  as recent studies have shown a favorable effect on heart attack and stroke rates in those who do     Your diabetes is still  under excellent control good control but your A1c has increased to 6.6  Remember that reducing your  carbohydrates (including  alcohol),   And getting back to a regular exercise program will manage both of these.  Please return in 3 months for diabetes follow up.

## 2018-04-25 NOTE — Progress Notes (Signed)
Subjective:  Patient ID: Terry [name], male    DOB: 11/24/1933  Age: 85 y.o. MRN: [id number]  CC: The primary encounter diagnosis was Hyperlipidemia with target LDL less than 70. Diagnoses of Hyperthyroidism, Diabetes mellitus with no complication ([place]), Essential hypertension, B12 deficiency, Diabetic nephropathy associated with type 2 diabetes mellitus ([place]), and COPD, moderate ([place]) were also pertinent to this visit.  HPI Terry [name] presents for 6 month  follow up on COPD  Hypertension,  Hyperthyroidism, anemia  And  type 2 DM   Labs done FEb 25 and reviewed   Thyroid underactive  6.13 on methimazole but T4 normal  Iron, normal  hgb stable at 11.5  Normal a1c up now to 6.6 from 6.2  Lipids normal  He feels generally well.  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   Patient does not check blood sugars more than once daily. Last one was 135  in a fasting state.  Dos not recall any above 200 or  less than 80.   Taking his medications as directed,  Not exercising on a regular basis or trying to lose weight.  Patient voices awareness  of the foods he/she needs to avoid,  And follows a low GI diet about 75 of the time.  Has not had an annual diabetic eye exam.  Denies numbness and tingling in lower extremities.  Denies hypoglycemic symptoms.    Outpatient Medications Prior to Visit  Medication Sig Dispense Refill  . aspirin EC 81 MG tablet Take 1 tablet (81 mg total) by mouth daily. 90 tablet 3  . b complex vitamins tablet Take 1 tablet by mouth daily.    . COMBIVENT RESPIMAT 20-100 MCG/ACT AERS respimat Use as directed 1 puff in the mouth or throat 4 (four) times daily as needed.  2  . cyanocobalamin 1000 MCG tablet Take 1,000 mcg by mouth daily.    . metFORMIN (GLUCOPHAGE-XR) 500 MG 24 hr tablet Take 1 tablet (500  mg total) by mouth daily. 90 tablet 1  . methimazole (TAPAZOLE) 5 MG tablet TAKE 1 TABLET BY MOUTH EVERY DAY 90 tablet 0  . metoprolol succinate (TOPROL-XL) 50 MG 24 hr tablet TAKE 1 TABLET (50 MG TOTAL) BY MOUTH DAILY. 90 tablet 1  . sildenafil (REVATIO) 20 MG tablet Take 1 tablet (20 mg total) by mouth 3 (three) times daily. 50 tablet 5  . simvastatin (ZOCOR) 40 MG tablet TAKE 1 TABLET BY MOUTH EVERYDAY AT BEDTIME 90 tablet 1  . Tiotropium Bromide Monohydrate (SPIRIVA RESPIMAT IN) Inhale 1 spray into the lungs daily.    . benazepril (LOTENSIN) 40 MG tablet TAKE 1 TABLET (40 MG TOTAL) BY MOUTH DAILY. 90 tablet 1  . omeprazole (PRILOSEC) 40 MG capsule Take 1 capsule (40 mg total) by mouth daily. (Patient not taking: Reported on 04/25/2018) 90 capsule 1   Facility-Administered Medications Prior to Visit  Medication Dose Route Frequency Provider Last Rate Last Dose  . ipratropium-albuterol (DUONEB) 0.5-2.5 (3) MG/3ML nebulizer solution 3 mL  3 mL Nebulization Q6H Burnard Hawthorne, FNP        Review of Systems;  Patient denies headache, fevers, malaise, unintentional weight loss, skin rash, eye pain, sinus congestion and sinus pain, sore throat, dysphagia,  hemoptysis , cough, dyspnea, wheezing, chest pain, palpitations, orthopnea, edema, abdominal  pain, nausea, melena, diarrhea, constipation, flank pain, dysuria, hematuria, urinary  Frequency, nocturia, numbness, tingling, seizures,  Focal weakness, Loss of consciousness,  Tremor, insomnia, depression, anxiety, and suicidal ideation.      Objective:  BP 118/62 (BP Location: Left Arm, Patient Position: Sitting, Cuff Size: Normal)   Pulse 77   Temp (!) 97.5 F (36.4 C) (Oral)   Resp 15   Ht 5' 11.5" (1.816 m)   Wt 165 lb (74.8 kg)   SpO2 93%   BMI 22.69 kg/m   BP Readings from Last 3 Encounters:  04/25/18 118/62  03/18/18 132/64  02/24/18 130/60    Wt Readings from Last 3 Encounters:  04/25/18 165 lb (74.8 kg)  03/18/18 167 lb  6.4 oz (75.9 kg)  02/24/18 165 lb 12.8 oz (75.2 kg)    General appearance: alert, cooperative and appears stated age Ears: normal TM's and external ear canals both ears Throat: lips, mucosa, and tongue normal; teeth and gums normal Neck: no adenopathy, no carotid bruit, supple, symmetrical, trachea midline and thyroid not enlarged, symmetric, no tenderness/mass/nodules Back: symmetric, no curvature. ROM normal. No CVA tenderness. Lungs: clear to auscultation bilaterally Heart: regular rate and rhythm, S1, S2 normal, no murmur, click, rub or gallop Abdomen: soft, non-tender; bowel sounds normal; no masses,  no organomegaly Pulses: 2+ and symmetric Skin: Skin color, texture, turgor normal. No rashes or lesions Lymph nodes: Cervical, supraclavicular, and axillary nodes normal.  Lab Results  Component Value Date   HGBA1C 6.6 (H) 03/24/2018   HGBA1C 6.2 (A) 10/08/2017   HGBA1C 7.0 (H) 02/25/2017    Lab Results  Component Value Date   CREATININE 1.16 03/24/2018   CREATININE 1.42 10/08/2017   CREATININE 1.3 05/02/2017    Lab Results  Component Value Date   WBC 8.9 03/24/2018   HGB 11.5 (L) 03/24/2018   HCT 34.1 (L) 03/24/2018   PLT 315.0 03/24/2018   GLUCOSE 109 (H) 03/24/2018   CHOL 139 03/24/2018   TRIG 95.0 03/24/2018   HDL 70.80 03/24/2018   LDLDIRECT 49.0 03/12/2016   LDLCALC 49 03/24/2018   ALT 22 03/24/2018   AST 25 03/24/2018   NA 136 03/24/2018   K 4.8 03/24/2018   CL 101 03/24/2018   CREATININE 1.16 03/24/2018   BUN 15 03/24/2018   CO2 26 03/24/2018   TSH 6.13 (H) 03/24/2018   PSA 0.75 11/12/2013   INR 0.9 12/12/2012   HGBA1C 6.6 (H) 03/24/2018   MICROALBUR 18.1 (H) 03/24/2018    Dg Chest 2 View  Result Date: 10/23/2016 CLINICAL DATA:  Right lower chest pain for 2 months, some weight loss, history of COPD EXAM: CHEST  2 VIEW COMPARISON:  Chest x-ray of 10/02/2012 FINDINGS: The lungs remain clear but hyperaerated suggestive of mild emphysematous change.  Mediastinal and hilar contours are unremarkable. Minimal basilar linear scarring is present. No pneumonia or effusion is seen. The heart is within normal limits in size. Only mild degenerative changes present in the lower thoracic spine for age. IMPRESSION: Hyperaeration may indicate mild emphysematous change. No active lung disease. Electronically Signed   By: Ivar Drape M.D.   On: 10/23/2016 08:39   US Abdomen Complete  Result Date: 10/23/2016 CLINICAL DATA:  Weight loss, abdominal pain EXAM: ABDOMEN ULTRASOUND COMPLETE COMPARISON:  CT 08/15/2016 FINDINGS: Gallbladder: No gallstones or wall thickening visualized. No sonographic Murphy sign noted by sonographer. Common bile duct: Diameter: Normal caliber, 3 mm. Liver: No focal lesion identified. Within normal limits in parenchymal echogenicity. Portal vein is  patent on color Doppler imaging with normal direction of blood flow towards the liver. IVC: No abnormality visualized. Pancreas: Not well visualized due to overlying bowel gas. Spleen: Size and appearance within normal limits. Right Kidney: Length: 11.0 cm. 2.7 cm cyst off the lower pole. No hydronephrosis. Normal echotexture. Left Kidney: Length: 11.3 cm. Echogenicity within normal limits. No mass or hydronephrosis visualized. Abdominal aorta: No aneurysm visualized. Other findings: None. IMPRESSION: No acute findings or significant abnormality. Electronically Signed   By: Rolm Baptise M.D.   On: 10/23/2016 09:13    Assessment & Plan:   Problem List Items Addressed This Visit    Hyperlipidemia with target LDL less than 70 - Primary    Current fasting panel is normal on simvastatin 40 mg daiy.  lfts normla,  No changes today  Lab Results  Component Value Date   CHOL 139 03/24/2018   HDL 70.80 03/24/2018   LDLCALC 49 03/24/2018   LDLDIRECT 49.0 03/12/2016   TRIG 95.0 03/24/2018   CHOLHDL 2 03/24/2018   Lab Results  Component Value Date   ALT 22 03/24/2018   AST 25 03/24/2018   ALKPHOS  61 03/24/2018   BILITOT 0.7 03/24/2018         Relevant Medications   telmisartan (MICARDIS) 80 MG tablet   Other Relevant Orders   Lipid panel   Hypertension    Well controlled on current regimen. Renal function stable.  I am making a decision to change patient's benazepril to telmisartan, based on increased anecdotal reports of life threateneng angioedema with ACE Inhibitors .  I also advised patient to take it at night instead of morning,  as recent studies have shown a favorable effect on CAD and CVA rates in those who do       Relevant Medications   telmisartan (MICARDIS) 80 MG tablet   Hyperthyroidism    Previously managed by Dr [name],  [facility],  with methimazole, until her departure. Most recent TSH is vastly different that former TSH with o medication changes but increased adherence to daily medication.  No changes give his lack of hypothyroid symptoms   Lab Results  Component Value Date   TSH 6.13 (H) 03/24/2018         Relevant Orders   TSH   T4, free   COPD, moderate (HCC)    With PFTs noting moderate airway disease due to air trapping and hyperinflation.  Continue use of combivent  When spiriv.a is too expensive       B12 deficiency    He has not had repeat monitoring in years but takes a daily oral supplement.. B12 ordered   Lab Results  Component Value Date   [id number] 469 10/10/2012         Diabetic nephropathy associated with type 2 diabetes mellitus ([place])    Managed with ACE I previously,  Changing to ARB   Lab Results  Component Value Date   CREATININE 1.16 03/24/2018   Lab Results  Component Value Date   MICROALBUR 18.1 (H) 03/24/2018        Relevant Medications   telmisartan (MICARDIS) 80 MG tablet    Other Visit Diagnoses    Diabetes mellitus with no complication (HCC)       Relevant Medications   telmisartan (MICARDIS) 80 MG tablet   Other Relevant Orders   Comprehensive metabolic panel   Hemoglobin A1c      I  have discontinued Eulalio D. Lafontaine "FRANK"'s benazepril. I am also having  him start on Zoster Vaccine Adjuvanted and telmisartan. Additionally, I am having him maintain his cyanocobalamin, b complex vitamins, Combivent Respimat, Tiotropium Bromide Monohydrate (SPIRIVA RESPIMAT IN), aspirin EC, sildenafil, simvastatin, metoprolol succinate, methimazole, omeprazole, and metFORMIN. We will continue to administer ipratropium-albuterol.  Meds ordered this encounter  Medications  . Zoster Vaccine Adjuvanted [facility]) injection    Sig: Inject 0.5 mLs into the muscle once for 1 dose.    Dispense:  1 each    Refill:  1  . telmisartan (MICARDIS) 80 MG tablet    Sig: Take 1 tablet (80 mg total) by mouth at bedtime.    Dispense:  90 tablet    Refill:  1    Medications Discontinued During This Encounter  Medication Reason  . benazepril (LOTENSIN) 40 MG tablet     Follow-up: Return in about 3 months (around 07/26/2018) for follow up diabetes.   [name], MD

## 2018-04-27 ENCOUNTER — Encounter: Payer: Self-pay | Admitting: Internal Medicine

## 2018-04-27 DIAGNOSIS — E1121 Type 2 diabetes mellitus with diabetic nephropathy: Secondary | ICD-10-CM | POA: Insufficient documentation

## 2018-04-27 NOTE — Assessment & Plan Note (Signed)
Managed with ACE I previously,  Changing to ARB   Lab Results  Component Value Date   CREATININE 1.16 03/24/2018   Lab Results  Component Value Date   MICROALBUR 18.1 (H) 03/24/2018

## 2018-04-27 NOTE — Assessment & Plan Note (Signed)
With PFTs noting moderate airway disease due to air trapping and hyperinflation.  Continue use of combivent  When spiriv.a is too expensive

## 2018-04-27 NOTE — Assessment & Plan Note (Signed)
Previously managed by Dr [name],  [facility],  with methimazole, until her departure. Most recent TSH is vastly different that former TSH with o medication changes but increased adherence to daily medication.  No changes give his lack of hypothyroid symptoms   Lab Results  Component Value Date   TSH 6.13 (H) 03/24/2018

## 2018-04-27 NOTE — Assessment & Plan Note (Signed)
Current fasting panel is normal on simvastatin 40 mg daiy.  lfts normla,  No changes today  Lab Results  Component Value Date   CHOL 139 03/24/2018   HDL 70.80 03/24/2018   LDLCALC 49 03/24/2018   LDLDIRECT 49.0 03/12/2016   TRIG 95.0 03/24/2018   CHOLHDL 2 03/24/2018   Lab Results  Component Value Date   ALT 22 03/24/2018   AST 25 03/24/2018   ALKPHOS 61 03/24/2018   BILITOT 0.7 03/24/2018

## 2018-04-27 NOTE — Assessment & Plan Note (Signed)
Likely secondary to diabetic nephropathy. Patient switching from taking ACE Inhibitor  To ARB  Lab Results  Component Value Date   CREATININE 1.16 03/24/2018   Lab Results  Component Value Date   MICROALBUR 18.1 (H) 03/24/2018

## 2018-04-27 NOTE — Assessment & Plan Note (Signed)
Well controlled on current regimen. Renal function stable.  I am making a decision to change patient's benazepril to telmisartan, based on increased anecdotal reports of life threateneng angioedema with ACE Inhibitors .  I also advised patient to take it at night instead of morning,  as recent studies have shown a favorable effect on CAD and CVA rates in those who do

## 2018-04-27 NOTE — Assessment & Plan Note (Signed)
Previously managed by Dr [name]. Currently well-controlled on current medications .  hemoglobin A1c is at goal of less than 7.0 . Patient is reminded to schedule an annual eye exam and foot exam is normal today. Patient has treated microalbuminuria. Patient is tolerating statin therapy for CAD risk reduction and on ACE/ARB for renal protection and hypertension    Lab Results  Component Value Date   HGBA1C 6.6 (H) 03/24/2018

## 2018-04-27 NOTE — Assessment & Plan Note (Signed)
He has not had repeat monitoring in years but takes a daily oral supplement.. B12 ordered   Lab Results  Component Value Date   VITAMINB12 479 10/10/2012

## 2018-05-06 ENCOUNTER — Other Ambulatory Visit: Payer: Self-pay | Admitting: Internal Medicine

## 2018-05-06 NOTE — Telephone Encounter (Signed)
Spoke with pt's wife and she stated that the pt is not taking this medication any longer.

## 2018-05-06 NOTE — Telephone Encounter (Signed)
Looks like the flomax has been discontinued.   Last OV: 04/25/2018 Next OV: 07/29/2018

## 2018-05-06 NOTE — Telephone Encounter (Signed)
Not so. I did not d/c it,  Pleas ask patient if he is  still taking Flomax ,  Because :  It looks like Denisa d/c'd during February visit,  We need to figure out how these meds are being dc'd ,  It would save [place] all a lot of time, and aggravation

## 2018-07-05 ENCOUNTER — Other Ambulatory Visit: Payer: Self-pay | Admitting: Internal Medicine

## 2018-07-11 ENCOUNTER — Other Ambulatory Visit: Payer: Self-pay | Admitting: Internal Medicine

## 2018-07-11 MED ORDER — METHIMAZOLE 5 MG PO TABS: 5.0000 mg | ORAL_TABLET | Freq: Every day | ORAL | 0 refills | Status: DC

## 2018-07-29 ENCOUNTER — Ambulatory Visit (INDEPENDENT_AMBULATORY_CARE_PROVIDER_SITE_OTHER): Payer: PPO | Admitting: Internal Medicine

## 2018-07-29 ENCOUNTER — Other Ambulatory Visit: Payer: Self-pay

## 2018-07-29 ENCOUNTER — Encounter: Payer: Self-pay | Admitting: Internal Medicine

## 2018-07-29 DIAGNOSIS — E785 Hyperlipidemia, unspecified: Secondary | ICD-10-CM | POA: Diagnosis not present

## 2018-07-29 DIAGNOSIS — E1121 Type 2 diabetes mellitus with diabetic nephropathy: Secondary | ICD-10-CM

## 2018-07-29 DIAGNOSIS — I10 Essential (primary) hypertension: Secondary | ICD-10-CM

## 2018-07-29 DIAGNOSIS — D649 Anemia, unspecified: Secondary | ICD-10-CM | POA: Diagnosis not present

## 2018-07-29 DIAGNOSIS — E538 Deficiency of other specified B group vitamins: Secondary | ICD-10-CM

## 2018-07-29 DIAGNOSIS — E119 Type 2 diabetes mellitus without complications: Secondary | ICD-10-CM | POA: Diagnosis not present

## 2018-07-29 DIAGNOSIS — E0590 Thyrotoxicosis, unspecified without thyrotoxic crisis or storm: Secondary | ICD-10-CM

## 2018-07-29 DIAGNOSIS — J449 Chronic obstructive pulmonary disease, unspecified: Secondary | ICD-10-CM

## 2018-07-29 DIAGNOSIS — E559 Vitamin D deficiency, unspecified: Secondary | ICD-10-CM

## 2018-07-29 MED ORDER — TAMSULOSIN HCL 0.4 MG PO CAPS: ORAL_CAPSULE | ORAL | 1 refills | Status: DC

## 2018-07-29 MED ORDER — OMEPRAZOLE 40 MG PO CPDR: 40.0000 mg | DELAYED_RELEASE_CAPSULE | Freq: Every day | ORAL | 1 refills | Status: DC

## 2018-07-29 MED ORDER — SILDENAFIL CITRATE 20 MG PO TABS: 20.0000 mg | ORAL_TABLET | Freq: Three times a day (TID) | ORAL | 5 refills | Status: DC

## 2018-07-29 MED ORDER — METFORMIN HCL 500 MG PO TABS: 500.0000 mg | ORAL_TABLET | Freq: Two times a day (BID) | ORAL | 3 refills | Status: DC

## 2018-07-29 MED ORDER — TELMISARTAN 80 MG PO TABS: 80.0000 mg | ORAL_TABLET | Freq: Every day | ORAL | 1 refills | Status: DC

## 2018-07-29 NOTE — Patient Instructions (Signed)
Please schedule a fasting labs appointment  In August with Terry [name]  Please check you blood pressure once a week for 3 weeks and send me the readings.  Our goal is 130/80 or less (but not less than 110/70 )  You have  been  advised to stop taking Metformin XR in accordance with the recent recall of the drug due to unacceptable levels of [place] found in multiple lots from multiple pharmaceutical companies .  I have called in the immediate release metformin to use instead , twice daily .  Decreased appetite and loose stools are  common side effects of the immediate release drug.

## 2018-07-29 NOTE — Progress Notes (Signed)
Telephone  Note  This visit type was conducted due to national recommendations for restrictions regarding the COVID-19 pandemic (e.g. social distancing).  This format is felt to be most appropriate for this patient at this time.  All issues noted in this document were discussed and addressed.  No physical exam was performed (except for noted visual exam findings with Video Visits).   I connected with@ on 07/29/18 at  3:00 PM EDT by a video enabled telemedicine application or telephone and verified that I am speaking with the correct person using two identifiers. Location patient: home Location provider: work or home office Persons participating in the virtual visit: patient, provider  I discussed the limitations, risks, security and privacy concerns of performing an evaluation and management service by telephone and the availability of in person appointments. I also discussed with the patient that there may be a patient responsible charge related to this service. The patient expressed understanding and agreed to proceed.  Reason for visit: follow up on hypertension, COPD and Type 2 DM    HPI:   The patient has no signs or symptoms of COVID 19 infection (fever, cough, sore throat  or shortness of breath beyond what is typical for patient).  Patient denies contact with other persons with the above mentioned symptoms or with anyone confirmed to have COVID 19 .  he has been minimizing his contact with the public and using a mask and hand sanitizer when he comes into any contact with the public.    Hypertension: patient OWNS a BP monitor but has not been checking BP  at home.   Patient is following a reduced salt diet most days and is taking medications as prescribed  Type 2 DM:  Not checking his blood sugars.    Taking metformin xr.  Not aware of the recall.  Not checking BS .follows a mediterranean diet . Limits starches to 2 servings daily.   Hyperthyroid:  No weight changes or tremors.   Sleeping well   COPD:  Denies any change in baseline dyspnea. Not short of breath at rest or with walking around inside the house.  Staying inside,  No sputum. Not using any scheduled inhaled therapy    ROS: See pertinent positives and negatives per HPI.  Past Medical History:  Diagnosis Date  . Anemia   . COPD (chronic obstructive pulmonary disease) ([place])   . Diabetes mellitus 2008  . GERD (gastroesophageal reflux disease)   . Hyperlipidemia   . Hypertension   . hyperthyroidism   . Squamous cell carcinoma of skin of left upper arm November 2015   Excised to negative margins, associated keratoacanthoma.  . 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 Dionne Milo. He is due for followup colonoscopy     Past Surgical History:  Procedure Laterality Date  . APPENDECTOMY  1970  . ARM SKIN LESION BIOPSY / EXCISION Left 2016  . CATARACT EXTRACTION, BILATERAL  2012   [facility]  . COLONOSCOPY  12-10-12  . COLONOSCOPY W/ POLYPECTOMY  2011   Dr. Raina Mina  . COLONOSCOPY WITH PROPOFOL N/A 09/19/2016   Procedure: COLONOSCOPY WITH PROPOFOL;  Surgeon: Robert Bellow, MD;  Location: [facility] ENDOSCOPY;  Service: Endoscopy;  Laterality: N/A;  . ESOPHAGOGASTRODUODENOSCOPY (EGD) WITH PROPOFOL N/A 09/19/2016   Procedure: ESOPHAGOGASTRODUODENOSCOPY (EGD) WITH PROPOFOL;  Surgeon: Robert Bellow, MD;  Location: [facility] ENDOSCOPY;  Service: Endoscopy;  Laterality: N/A;    Family History  Problem Relation Age of Onset  .  Hypertension Father   . Diabetes Maternal Aunt   . Hyperlipidemia Maternal Uncle   . Birth defects Neg Hx     SOCIAL HX:  reports that he quit smoking about 19 years ago. His smoking use included cigarettes. He has a 100.00 pack-year smoking history. He has never used smokeless tobacco. He reports current alcohol use of about 25.0 standard drinks of alcohol per week. He reports that he does not use drugs.   Current Outpatient Medications:  .  aspirin EC  81 MG tablet, Take 1 tablet (81 mg total) by mouth daily., Disp: 90 tablet, Rfl: 3 .  b complex vitamins tablet, Take 1 tablet by mouth daily., Disp: , Rfl:  .  cyanocobalamin 1000 MCG tablet, Take 1,000 mcg by mouth daily., Disp: , Rfl:  .  methimazole (TAPAZOLE) 5 MG tablet, Take 1 tablet (5 mg total) by mouth daily., Disp: 90 tablet, Rfl: 0 .  metoprolol succinate (TOPROL-XL) 50 MG 24 hr tablet, TAKE 1 TABLET (50 MG TOTAL) BY MOUTH DAILY., Disp: 90 tablet, Rfl: 1 .  omeprazole (PRILOSEC) 40 MG capsule, Take 1 capsule (40 mg total) by mouth daily., Disp: 90 capsule, Rfl: 1 .  sildenafil (REVATIO) 20 MG tablet, Take 1 tablet (20 mg total) by mouth 3 (three) times daily., Disp: 50 tablet, Rfl: 5 .  simvastatin (ZOCOR) 40 MG tablet, TAKE 1 TABLET BY MOUTH EVERYDAY AT BEDTIME, Disp: 90 tablet, Rfl: 1 .  telmisartan (MICARDIS) 80 MG tablet, Take 1 tablet (80 mg total) by mouth at bedtime., Disp: 90 tablet, Rfl: 1 .  COMBIVENT RESPIMAT 20-100 MCG/ACT AERS respimat, Use as directed 1 puff in the mouth or throat 4 (four) times daily as needed., Disp: , Rfl: 2 .  metFORMIN (GLUCOPHAGE) 500 MG tablet, Take 1 tablet (500 mg total) by mouth 2 (two) times daily with a meal., Disp: 180 tablet, Rfl: 3 .  tamsulosin (FLOMAX) 0.4 MG CAPS capsule, TAKE 1 CAPSULE (0.4 MG TOTAL) BY MOUTH DAILY., Disp: 90 capsule, Rfl: 1  Current Facility-Administered Medications:  .  ipratropium-albuterol (DUONEB) 0.5-2.5 (3) MG/3ML nebulizer solution 3 mL, 3 mL, Nebulization, Q6H, Arnett, Yvetta Coder, FNP  EXAM:   General impression: alert, cooperative and articulate.  No signs of being in distress  Lungs: speech is fluent sentence length suggests that patient is not short of breath and not punctuated by cough, sneezing or sniffing. Marland Kitchen   Psych: affect normal.  speech is articulate and non pressured Denies suicidal thoughts   ASSESSMENT AND PLAN:  Discussed the following assessment and plan:  Anemia Mild normocytic,   Chronic.  B12 deficient and taking oral supplements. Normal colonoscopy in 2018,  EGD noted gastritis and PPI was increased to PPI,  Alcohol abuse was also addressed and he has remained abstinent. Repeat labs to be done in august   Lab Results  Component Value Date   WBC 8.9 03/24/2018   HGB 11.5 (L) 03/24/2018   HCT 34.1 (L) 03/24/2018   MCV 92.5 03/24/2018   PLT 315.0 03/24/2018     COPD, moderate With PFTs noting moderate airway disease due to air trapping and hyperinflation.  Advised to resume use of combivent  When spiriva is too expensive   Diabetes mellitus type 2, noninsulin dependent ([place]) Stopping metformin xr due to recall.  Resume metformin 500 mg bid.  Labs needed in August.  Up to date on eye exams.   Lab Results  Component Value Date   HGBA1C 6.6 (H) 03/24/2018  Diabetic nephropathy associated with type 2 diabetes mellitus ([place]) Managed with ACE I previously,  Now on an ARB   Lab Results  Component Value Date   CREATININE 1.16 03/24/2018   Lab Results  Component Value Date   MICROALBUR 18.1 (H) 03/24/2018    Hypertension Managed with telmisartan and metoprolol  He  has been asked to check pressures at home and submit readings for evaluation.   Hyperthyroidism Previously managed by Dr [name],  [facility],  with methimazole, until her departure. Most recent TSH is vastly different than former TSH despite having had no medication changes,  but with increased  adherence to daily medication.  No changes were made in February given  his lack of hypothyroid symptoms  .  Will recheck in august  Lab Results  Component Value Date   TSH 6.13 (H) 03/24/2018       I discussed the assessment and treatment plan with the patient. The patient was provided an opportunity to ask questions and all were answered. The patient agreed with the plan and demonstrated an understanding of the instructions.   The patient was advised to call back or seek an in-person  evaluation if the symptoms worsen or if the condition fails to improve as anticipated.  I provided 25 minutes of non-face-to-face time during this encounter.   [name], MD

## 2018-07-30 ENCOUNTER — Telehealth: Payer: Self-pay

## 2018-07-30 NOTE — Assessment & Plan Note (Signed)
Previously managed by Dr [name],  [facility],  with methimazole, until her departure. Most recent TSH is vastly different than former TSH despite having had no medication changes,  but with increased  adherence to daily medication.  No changes were made in February given  his lack of hypothyroid symptoms  .  Will recheck in august  Lab Results  Component Value Date   TSH 6.13 (H) 03/24/2018

## 2018-07-30 NOTE — Telephone Encounter (Signed)
-----   Message from [name], MD sent at 07/29/2018  3:28 PM EDT ----- Fasting labs needed in august

## 2018-07-30 NOTE — Assessment & Plan Note (Signed)
Managed with telmisartan and metoprolol  He  has been asked to check pressures at home and submit readings for evaluation.

## 2018-07-30 NOTE — Assessment & Plan Note (Signed)
Stopping metformin xr due to recall.  Resume metformin 500 mg bid.  Labs needed in August.  Up to date on eye exams.   Lab Results  Component Value Date   HGBA1C 6.6 (H) 03/24/2018

## 2018-07-30 NOTE — Assessment & Plan Note (Signed)
With PFTs noting moderate airway disease due to air trapping and hyperinflation.  Advised to resume use of combivent  When spiriva is too expensive

## 2018-07-30 NOTE — Assessment & Plan Note (Addendum)
Mild normocytic,  Chronic.  B12 deficient and taking oral supplements. Normal colonoscopy in 2018,  EGD noted gastritis and PPI was increased to PPI,  Alcohol abuse was also addressed and he has remained abstinent. Repeat labs to be done in august   Lab Results  Component Value Date   WBC 8.9 03/24/2018   HGB 11.5 (L) 03/24/2018   HCT 34.1 (L) 03/24/2018   MCV 92.5 03/24/2018   PLT 315.0 03/24/2018

## 2018-07-30 NOTE — Telephone Encounter (Signed)
appt has been scheduled and pt is aware of appt date and time.

## 2018-07-30 NOTE — Assessment & Plan Note (Signed)
Managed with ACE I previously,  Now on an ARB   Lab Results  Component Value Date   CREATININE 1.16 03/24/2018   Lab Results  Component Value Date   MICROALBUR 18.1 (H) 03/24/2018

## 2018-09-09 ENCOUNTER — Other Ambulatory Visit: Payer: Self-pay | Admitting: Internal Medicine

## 2018-09-17 ENCOUNTER — Other Ambulatory Visit: Payer: PPO

## 2018-09-19 ENCOUNTER — Other Ambulatory Visit: Payer: Self-pay | Admitting: Internal Medicine

## 2018-09-29 ENCOUNTER — Other Ambulatory Visit (INDEPENDENT_AMBULATORY_CARE_PROVIDER_SITE_OTHER): Payer: PPO

## 2018-09-29 ENCOUNTER — Other Ambulatory Visit: Payer: Self-pay

## 2018-09-29 DIAGNOSIS — E119 Type 2 diabetes mellitus without complications: Secondary | ICD-10-CM

## 2018-09-29 DIAGNOSIS — E0590 Thyrotoxicosis, unspecified without thyrotoxic crisis or storm: Secondary | ICD-10-CM | POA: Diagnosis not present

## 2018-09-29 DIAGNOSIS — E538 Deficiency of other specified B group vitamins: Secondary | ICD-10-CM | POA: Diagnosis not present

## 2018-09-29 DIAGNOSIS — E559 Vitamin D deficiency, unspecified: Secondary | ICD-10-CM | POA: Diagnosis not present

## 2018-09-29 DIAGNOSIS — D649 Anemia, unspecified: Secondary | ICD-10-CM | POA: Diagnosis not present

## 2018-09-29 DIAGNOSIS — E785 Hyperlipidemia, unspecified: Secondary | ICD-10-CM

## 2018-09-29 LAB — COMPREHENSIVE METABOLIC PANEL
ALT: 13 U/L (ref 0–53)
AST: 16 U/L (ref 0–37)
Albumin: 3.9 g/dL (ref 3.5–5.2)
Alkaline Phosphatase: 58 U/L (ref 39–117)
BUN: 25 mg/dL — ABNORMAL HIGH (ref 6–23)
CO2: 27 mEq/L (ref 19–32)
Calcium: 9.4 mg/dL (ref 8.4–10.5)
Chloride: 102 mEq/L (ref 96–112)
Creatinine, Ser: 1.42 mg/dL (ref 0.40–1.50)
GFR: 47.54 mL/min — ABNORMAL LOW (ref >60.00)
Glucose, Bld: 103 mg/dL — ABNORMAL HIGH (ref 70–99)
Potassium: 4.8 mEq/L (ref 3.5–5.1)
Sodium: 137 mEq/L (ref 135–145)
Total Bilirubin: 0.4 mg/dL (ref 0.2–1.2)
Total Protein: 6.4 g/dL (ref 6.0–8.3)

## 2018-09-29 LAB — CBC WITH DIFFERENTIAL/PLATELET
Basophils Absolute: 0 10*3/uL (ref 0.0–0.1)
Basophils Relative: 0.6 % (ref 0.0–3.0)
Eosinophils Absolute: 0.2 10*3/uL (ref 0.0–0.7)
Eosinophils Relative: 3.2 % (ref 0.0–5.0)
HCT: 36 % — ABNORMAL LOW (ref 39.0–52.0)
Hemoglobin: 12 g/dL — ABNORMAL LOW (ref 13.0–17.0)
Lymphocytes Relative: 27.5 % (ref 12.0–46.0)
Lymphs Abs: 2.1 10*3/uL (ref 0.7–4.0)
MCHC: 33.3 g/dL (ref 30.0–36.0)
MCV: 92 fl (ref 78.0–100.0)
Monocytes Absolute: 0.8 10*3/uL (ref 0.1–1.0)
Monocytes Relative: 10.3 % (ref 3.0–12.0)
Neutro Abs: 4.4 10*3/uL (ref 1.4–7.7)
Neutrophils Relative %: 58.4 % (ref 43.0–77.0)
Platelets: 313 10*3/uL (ref 150.0–400.0)
RBC: 3.91 Mil/uL — ABNORMAL LOW (ref 4.22–5.81)
RDW: 14.3 % (ref 11.5–15.5)
WBC: 7.5 10*3/uL (ref 4.0–10.5)

## 2018-09-29 LAB — TSH: TSH: 3.43 u[IU]/mL (ref 0.35–4.50)

## 2018-09-29 LAB — LIPID PANEL
Cholesterol: 153 mg/dL (ref 0–200)
HDL: 68 mg/dL (ref >39.00)
LDL Cholesterol: 65 mg/dL (ref 0–99)
NonHDL: 84.95
Total CHOL/HDL Ratio: 2
Triglycerides: 99 mg/dL (ref 0.0–149.0)
VLDL: 19.8 mg/dL (ref 0.0–40.0)

## 2018-09-29 LAB — HEMOGLOBIN A1C: Hgb A1c MFr Bld: 6.9 % — ABNORMAL HIGH (ref 4.6–6.5)

## 2018-09-29 LAB — T4, FREE: Free T4: 0.93 ng/dL (ref 0.60–1.60)

## 2018-09-29 LAB — VITAMIN B12: Vitamin B-12: 569 pg/mL (ref 211–911)

## 2018-09-29 LAB — VITAMIN D 25 HYDROXY (VIT D DEFICIENCY, FRACTURES): VITD: 35.78 ng/mL (ref 30.00–100.00)

## 2018-10-05 ENCOUNTER — Other Ambulatory Visit: Payer: Self-pay | Admitting: Internal Medicine

## 2018-10-20 DIAGNOSIS — E119 Type 2 diabetes mellitus without complications: Secondary | ICD-10-CM | POA: Diagnosis not present

## 2018-10-20 LAB — HM DIABETES EYE EXAM

## 2018-11-07 ENCOUNTER — Other Ambulatory Visit: Payer: Self-pay | Admitting: Internal Medicine

## 2018-12-23 ENCOUNTER — Ambulatory Visit (INDEPENDENT_AMBULATORY_CARE_PROVIDER_SITE_OTHER): Payer: PPO

## 2018-12-23 ENCOUNTER — Other Ambulatory Visit: Payer: Self-pay

## 2018-12-23 DIAGNOSIS — Z23 Encounter for immunization: Secondary | ICD-10-CM | POA: Diagnosis not present

## 2019-01-13 IMAGING — US US ABDOMEN COMPLETE
1 series · 14 of 25 positions shown · non-contrast
Comparison: CT 08/15/2016

CLINICAL DATA: Weight loss, abdominal pain

EXAM:
ABDOMEN ULTRASOUND COMPLETE

[Series 1: us abdomen complete · 0.17mm/px · 14 of 86 slices shown]
[im 1/86]
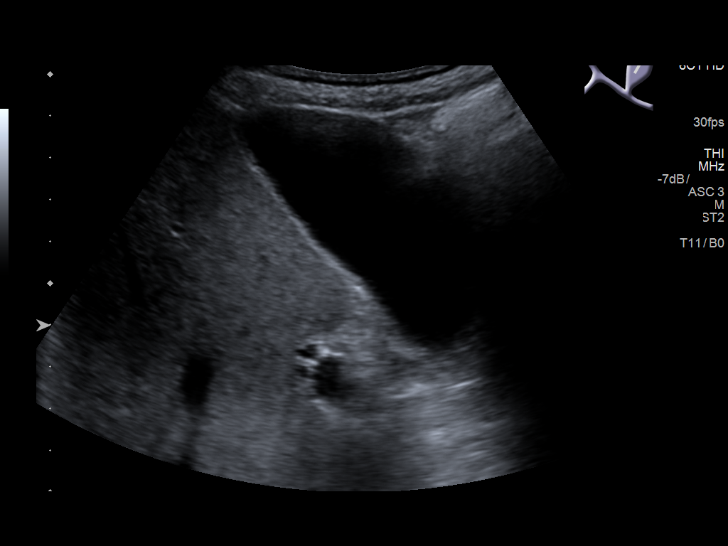
[im 8/86]
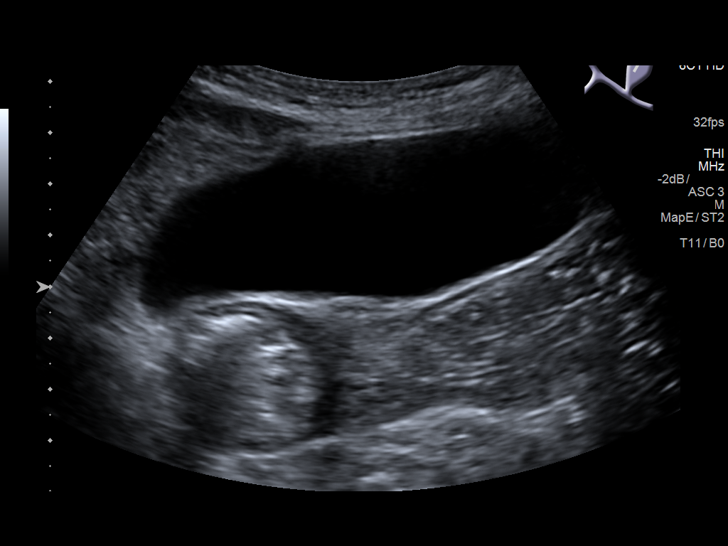
[im 15/86]
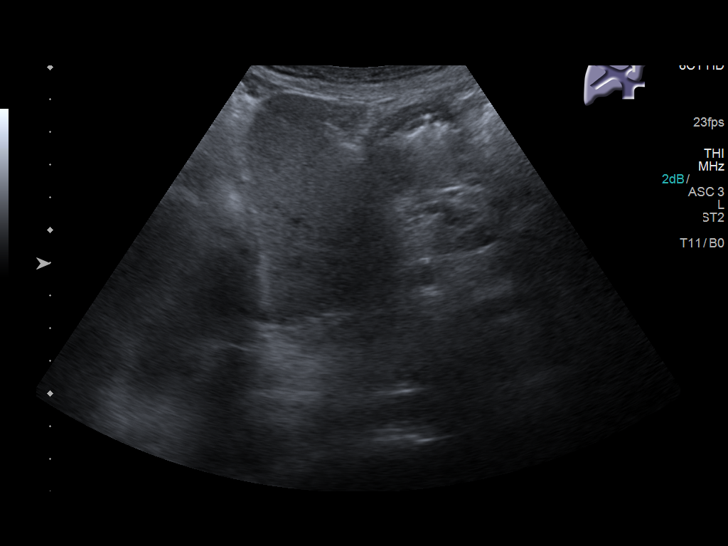
[im 22/86]
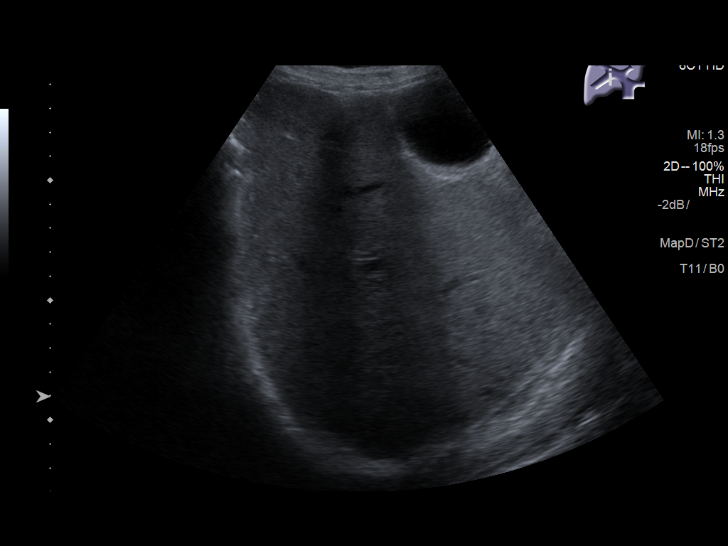
[im 29/86]
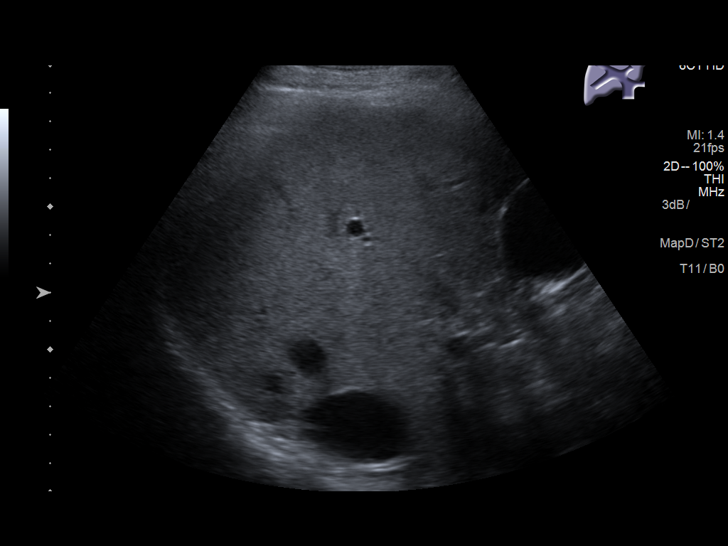
[im 32/86]
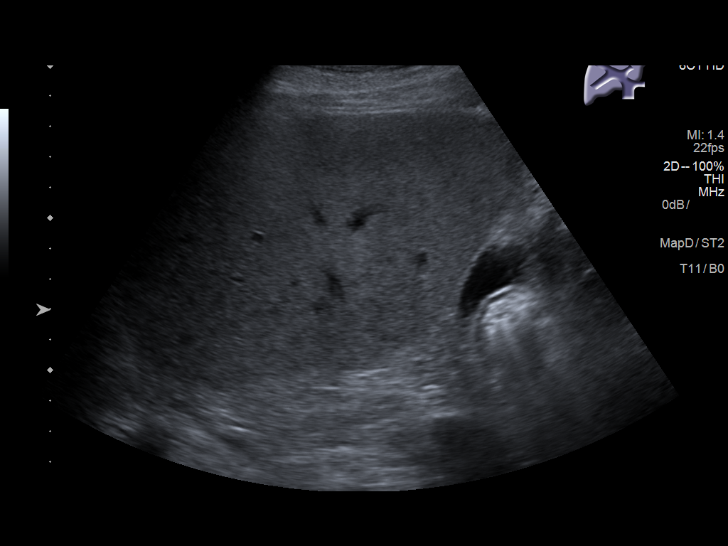
[im 39/86]
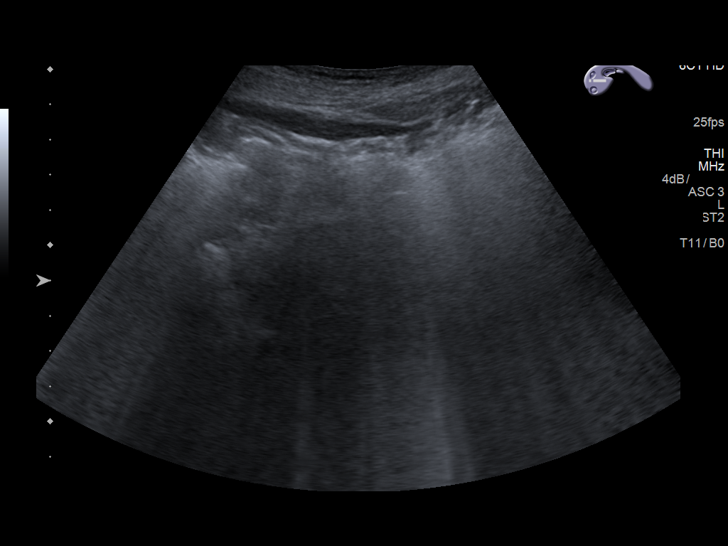
[im 47/86]
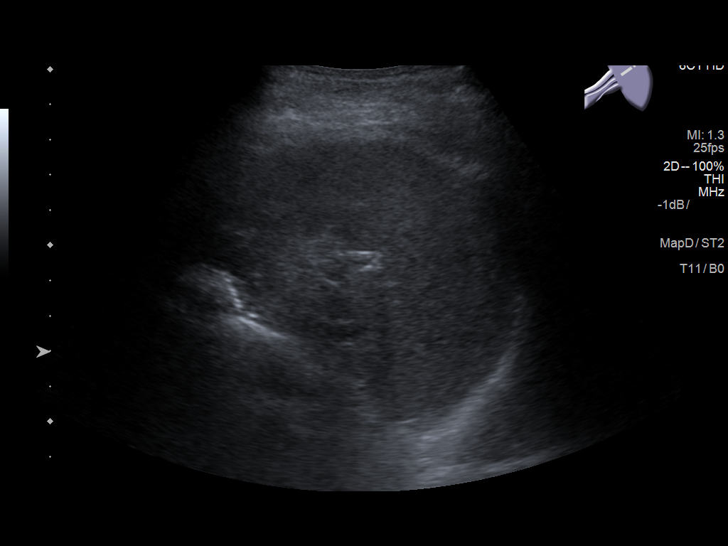
[im 54/86]
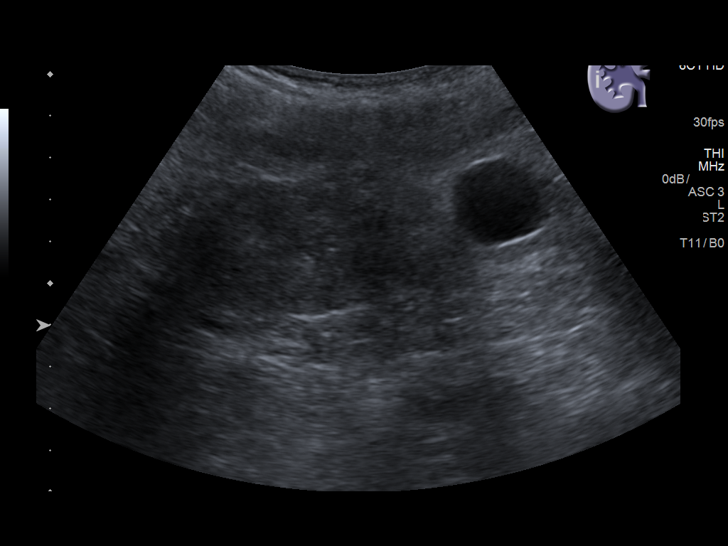
[im 57/86]
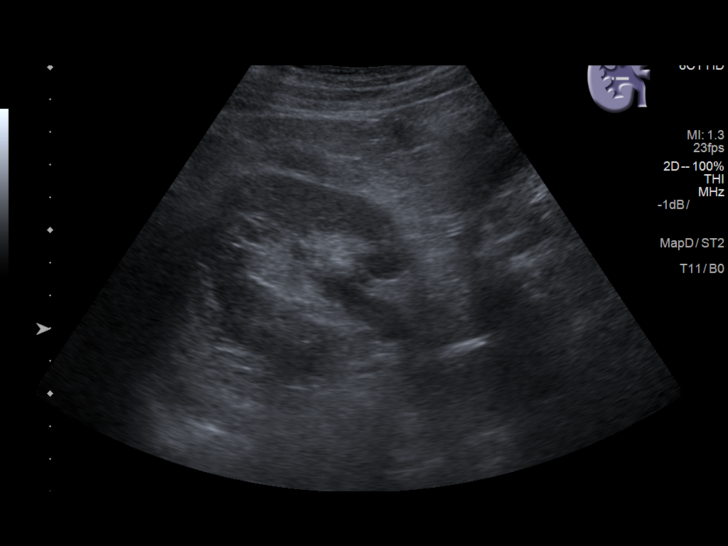
[im 64/86]
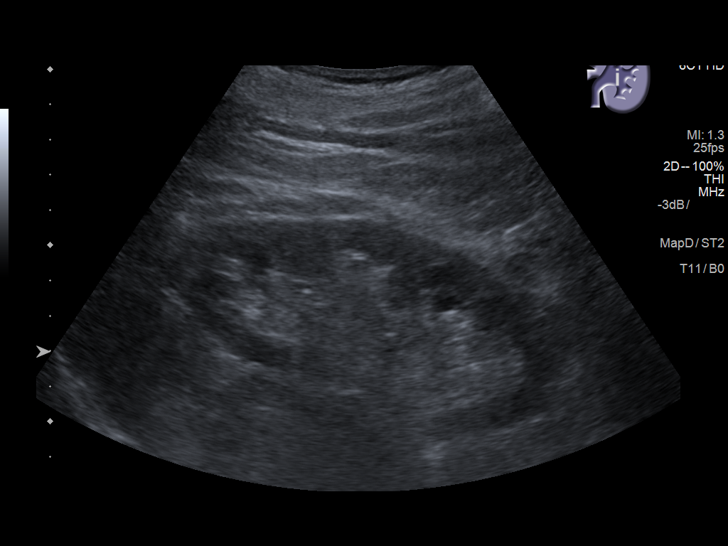
[im 71/86]
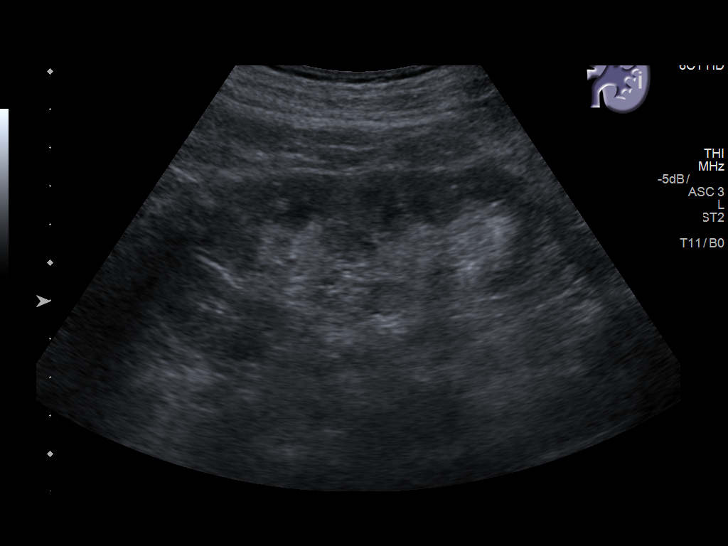
[im 78/86]
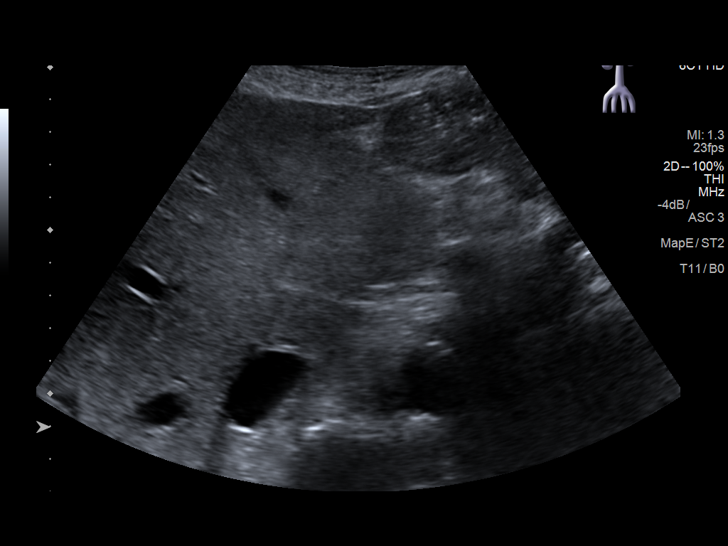
[im 86/86]
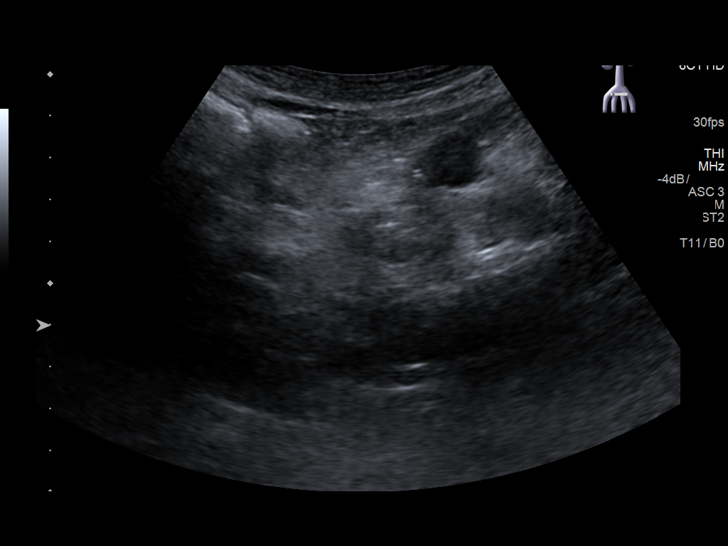

[14 of 25 positions shown; findings below may reference images not displayed]

FINDINGS: Gallbladder: No gallstones or wall thickening visualized. No
sonographic Murphy sign noted by sonographer.

Common bile duct: Diameter: Normal caliber, 3 mm.

Liver: No focal lesion identified. Within normal limits in
parenchymal echogenicity. Portal vein is patent on color Doppler
imaging with normal direction of blood flow towards the liver.

IVC: No abnormality visualized.

Pancreas: Not well visualized due to overlying bowel gas.

Spleen: Size and appearance within normal limits.

Right Kidney: Length: 11.0 cm. 2.7 cm cyst off the lower pole. No
hydronephrosis. Normal echotexture.

Left Kidney: Length: 11.3 cm. Echogenicity within normal limits. No
mass or hydronephrosis visualized.

Abdominal aorta: No aneurysm visualized.

Other findings: None.
IMPRESSION: No acute findings or significant abnormality.

## 2019-01-20 ENCOUNTER — Other Ambulatory Visit: Payer: Self-pay | Admitting: Internal Medicine

## 2019-03-19 ENCOUNTER — Other Ambulatory Visit: Payer: Self-pay | Admitting: Internal Medicine

## 2019-03-23 ENCOUNTER — Ambulatory Visit (INDEPENDENT_AMBULATORY_CARE_PROVIDER_SITE_OTHER): Payer: PPO

## 2019-03-23 ENCOUNTER — Other Ambulatory Visit: Payer: Self-pay

## 2019-03-23 ENCOUNTER — Telehealth: Payer: PPO | Admitting: Internal Medicine

## 2019-03-23 VITALS — Ht 71.5 in | Wt 165.0 lb

## 2019-03-23 DIAGNOSIS — Z0000 Encounter for general adult medical examination without abnormal findings: Secondary | ICD-10-CM

## 2019-03-23 NOTE — Progress Notes (Addendum)
Subjective:   Terry [name] is a 86 y.o. male who presents for Medicare Annual (Subsequent) preventive examination.  Review of Systems:  No ROS.  Medicare Wellness Virtual Visit.  Visual/audio telehealth visit, UTA vital signs.   Ht/Wt provided.  See social history for additional risk factors.   Cardiac Risk Factors include: advanced age (>33men, >2 women);male gender;diabetes mellitus;hypertension     Objective:     Vitals: Ht 5' 11.5" (1.816 m)   Wt 165 lb (74.8 kg)   BMI 22.69 kg/m   Body mass index is 22.69 kg/m.  Advanced Directives 03/23/2019 03/18/2018 11/06/2016 09/19/2016  Does Patient Have a Medical Advance Directive? Yes Yes Yes No  Type of Advance Directive - Living will [place];Living will -  Does patient want to make changes to medical advance directive? No - Patient declined No - Patient declined No - Patient declined -  Copy of [place] in Chart? - - No - copy requested -  Would patient like information on creating a medical advance directive? - - - No - Patient declined    Tobacco Social History   Tobacco Use  Smoking Status Former Smoker  . Packs/day: 2.00  . Years: 50.00  . Pack years: 100.00  . Types: Cigarettes  . Quit date: 06/26/1999  . Years since quitting: 19.7  Smokeless Tobacco Never Used     Counseling given: Not Answered   Clinical Intake:  Pre-visit preparation completed: Yes        Diabetes: Yes(Followed by pcp)  How often do you need to have someone help you when you read instructions, pamphlets, or other written materials from your doctor or pharmacy?: 1 - Never  Interpreter Needed?: No     Past Medical History:  Diagnosis Date  . Anemia   . COPD (chronic obstructive pulmonary disease) ([place])   . Diabetes mellitus 2008  . GERD (gastroesophageal reflux disease)   . Hyperlipidemia   . Hypertension   . hyperthyroidism   . Squamous cell carcinoma of skin of left upper arm  November 2015   Excised to negative margins, associated keratoacanthoma.  . 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 Dionne Milo. He is due for followup colonoscopy    Past Surgical History:  Procedure Laterality Date  . APPENDECTOMY  1970  . ARM SKIN LESION BIOPSY / EXCISION Left 2016  . CATARACT EXTRACTION, BILATERAL  2012   Porfilio  . COLONOSCOPY  12-10-12  . COLONOSCOPY W/ POLYPECTOMY  2011   Dr. Raina Mina  . COLONOSCOPY WITH PROPOFOL N/A 09/19/2016   Procedure: COLONOSCOPY WITH PROPOFOL;  Surgeon: Robert Bellow, MD;  Location[facility] ENDOSCOPY;  Service: Endoscopy;  Laterality: N/A;  . ESOPHAGOGASTRODUODENOSCOPY (EGD) WITH PROPOFOL N/A 09/19/2016   Procedure: ESOPHAGOGASTRODUODENOSCOPY (EGD) WITH PROPOFOL;  Surgeon: Robert Bellow, MD;  Location: [facility] ENDOSCOPY;  Service: Endoscopy;  Laterality: N/A;   Family History  Problem Relation Age of Onset  . Hypertension Father   . Diabetes Maternal Aunt   . Hyperlipidemia Maternal Uncle   . Birth defects Neg Hx    Social History   Socioeconomic History  . Marital status: Married    Spouse name: Not on file  . Number of children: Not on file  . Years of education: Not on file  . Highest education level: Not on file  Occupational History  . Not on file  Tobacco Use  . Smoking status: Former Smoker    Packs/day:  2.00    Years: 50.00    Pack years: 100.00    Types: Cigarettes    Quit date: 06/26/1999    Years since quitting: 19.7  . Smokeless tobacco: Never Used  Substance and Sexual Activity  . Alcohol use: Not Currently  . Drug use: No  . Sexual activity: Yes  Other Topics Concern  . Not on file  Social History Narrative  . Not on file   Social Determinants of Health   Financial Resource Strain:   . Difficulty of Paying Living Expenses: Not on file  Food Insecurity:   . Worried About Charity fundraiser in the Last Year: Not on file  . Ran Out of Food in the Last Year: Not on  file  Transportation Needs:   . Lack of Transportation (Medical): Not on file  . Lack of Transportation (Non-Medical): Not on file  Physical Activity:   . Days of Exercise per Week: Not on file  . Minutes of Exercise per Session: Not on file  Stress:   . Feeling of Stress : Not on file  Social Connections:   . Frequency of Communication with Friends and Family: Not on file  . Frequency of Social Gatherings with Friends and Family: Not on file  . Attends Religious Services: Not on file  . Active Member of Clubs or Organizations: Not on file  . Attends Archivist Meetings: Not on file  . Marital Status: Not on file    Outpatient Encounter Medications as of 03/23/2019  Medication Sig  . aspirin EC 81 MG tablet Take 1 tablet (81 mg total) by mouth daily.  Marland Kitchen b complex vitamins tablet Take 1 tablet by mouth daily.  . COMBIVENT RESPIMAT 20-100 MCG/ACT AERS respimat Use as directed 1 puff in the mouth or throat 4 (four) times daily as needed.  . cyanocobalamin 1000 MCG tablet Take 1,000 mcg by mouth daily.  . metFORMIN (GLUCOPHAGE) 500 MG tablet Take 1 tablet (500 mg total) by mouth 2 (two) times daily with a meal.  . methimazole (TAPAZOLE) 5 MG tablet TAKE 1 TABLET BY MOUTH EVERY DAY  . metoprolol succinate (TOPROL-XL) 50 MG 24 hr tablet TAKE 1 TABLET (50 MG TOTAL) BY MOUTH DAILY.  Marland Kitchen omeprazole (PRILOSEC) 40 MG capsule TAKE 1 CAPSULE BY MOUTH EVERY DAY  . sildenafil (REVATIO) 20 MG tablet Take 1 tablet (20 mg total) by mouth 3 (three) times daily.  . simvastatin (ZOCOR) 40 MG tablet TAKE 1 TABLET BY MOUTH EVERYDAY AT BEDTIME  . tamsulosin (FLOMAX) 0.4 MG CAPS capsule TAKE 1 CAPSULE (0.4 MG TOTAL) BY MOUTH DAILY.  Marland Kitchen telmisartan (MICARDIS) 80 MG tablet Take 1 tablet (80 mg total) by mouth at bedtime.   Facility-Administered Encounter Medications as of 03/23/2019  Medication  . ipratropium-albuterol (DUONEB) 0.5-2.5 (3) MG/3ML nebulizer solution 3 mL    Activities of Daily  Living In your present state of health, do you have any difficulty performing the following activities: 03/23/2019  Hearing? N  Vision? N  Difficulty concentrating or making decisions? N  Walking or climbing stairs? N  Dressing or bathing? N  Doing errands, shopping? N  Preparing Food and eating ? N  Using the Toilet? N  In the past six months, have you accidently leaked urine? N  Do you have problems with loss of bowel control? N  Managing your Medications? Y  Comment Wife assists  Managing your Finances? N  Housekeeping or managing your Housekeeping? N  Some recent data  might be hidden    Patient Care Team: [name], MD as PCP - General (Internal Medicine) Bary Castilla, Forest Gleason, MD (General Surgery) [name], MD (Internal Medicine) Wellington Hampshire, MD as Consulting Physician (Cardiology)    Assessment:   This is a routine wellness examination for Delron.  Nurse connected with patient 03/23/19 at  9:00 AM EST by a telephone enabled telemedicine application and verified that I am speaking with the correct person using two identifiers. Patient stated full name and DOB. Patient gave permission to continue with virtual visit. Patient's location was at home and Nurse's location was at [place] office.   Patient is alert and oriented x3. Patient denies difficulty focusing or concentrating.  Health Maintenance Due: -Foot Exam- reports annual exam with podiatrist. No new changes. Denies wounds, numbness, tingling and does not walk with bare feet.  -Hgb A1c- 09/29/19 (6.9) See completed HM at the end of note.   Eye: Visual acuity not assessed. Virtual visit. Followed by their ophthalmologist. Retinopathy- none reported.  Dental: Dentures- yes  Hearing: Demonstrates normal hearing during visit.  Safety:  Patient feels safe at home- yes Patient does have smoke detectors at home- yes Patient does wear sunscreen or protective clothing when in direct sunlight -  yes Patient does wear seat belt when in a moving vehicle - yes Patient drives- yes Adequate lighting in walkways free from debris- yes Grab bars and handrails used as appropriate- yes Ambulates with an assistive device- no Cell phone on person when ambulating outside of the home- yes  Social: Alcohol intake - not currently    Smoking history- former   Smokers in home? none Illicit drug use? none  Medication: Taking as directed and without issues.  Self/wife managed - yes   Covid-19: Precautions and sickness symptoms discussed. Wears mask, social distancing, hand hygiene as appropriate.   Activities of Daily Living Patient denies needing assistance with: household chores, feeding themselves, getting from bed to chair, getting to the toilet, bathing/showering, dressing, managing money, or preparing meals.   Discussed the importance of a healthy diet, water intake and the benefits of aerobic exercise.   Physical activity- active around the home. No routine but plans to start walking again.   Diet:  Low carb Water: fair intake  Other Providers Patient Care Team: [name], MD as PCP - General (Internal Medicine) Bary Castilla, Forest Gleason, MD (General Surgery) [name], MD (Internal Medicine) Wellington Hampshire, MD as Consulting Physician (Cardiology)  Exercise Activities and Dietary recommendations    Goals      Patient Stated   . Gain weight (pt-stated)    . I'd like to drink more water and walk for exercise (pt-stated)       Fall Risk Fall Risk  03/23/2019 03/18/2018 11/06/2016 07/18/2016 01/02/2016  Falls in the past year? 0 0 No No No  Follow up Falls evaluation completed - - - -   Timed Get Up and Go performed: no, virtual visit  Depression Screen PHQ 2/9 Scores 03/23/2019 03/18/2018 11/06/2016 07/18/2016  PHQ - 2 Score 0 0 0 0     Cognitive Function MMSE - Mini Mental State Exam 11/06/2016  Orientation to time 5  Orientation to Place 5  Registration 3   Attention/ Calculation 5  Recall 3  Language- name 2 objects 2  Language- repeat 1  Language- follow 3 step command 3  Language- read & follow direction 1  Write a sentence 1  Copy design 1  Total score 30     6CIT Screen 03/23/2019 03/18/2018  What Year? 0 points 0 points  What month? 0 points 0 points  What time? 0 points 0 points  Count back from 20 - 0 points  Months in reverse 0 points 0 points  Repeat phrase - 0 points  Total Score - 0    Immunization History  Administered Date(s) Administered  . Fluad Quad(high Dose 65+) 12/23/2018  . Influenza, High Dose Seasonal PF 01/17/2015, 11/06/2016, 10/08/2017  . Influenza,inj,Quad PF,6+ Mos 10/02/2012, 11/12/2013  . Influenza-Unspecified 10/30/2015  . Pneumococcal Conjugate-13 11/12/2013  . Pneumococcal Polysaccharide-23 10/02/2012  . Tdap 02/25/2013   Screening Tests Health Maintenance  Topic Date Due  . FOOT EXAM  03/29/2019  . HEMOGLOBIN A1C  03/29/2019  . OPHTHALMOLOGY EXAM  10/20/2019  . TETANUS/TDAP  02/26/2023  . INFLUENZA VACCINE  Completed  . PNA vac Low Risk Adult  Completed      Plan:   Keep all routine maintenance appointments.   Follow up today @ 03/23/19 @ 10:30  Medicare Attestation I have personally reviewed: The patient's medical and social history Their use of alcohol, tobacco or illicit drugs Their current medications and supplements The patient's functional ability including ADLs,fall risks, home safety risks, cognitive, and hearing and visual impairment Diet and physical activities Evidence for depression   I have reviewed and discussed with patient certain preventive protocols, quality metrics, and best practice recommendations.      OBrien-Blaney, Kosisochukwu Burningham L, LPN  [id number]    I have reviewed the above information and agree with above.   Deborra Medina, MD

## 2019-03-23 NOTE — Patient Instructions (Addendum)
  Terry [name] , Thank you for taking time to come for your Medicare Wellness Visit. I appreciate your ongoing commitment to your health goals. Please review the following plan we discussed and let me know if I can assist you in the future.   These are the goals we discussed: Goals      Patient Stated   . Gain weight (pt-stated)    . I'd like to drink more water and walk for exercise (pt-stated)       This is a list of the screening recommended for you and due dates:  Health Maintenance  Topic Date Due  . Complete foot exam   03/29/2019  . Hemoglobin A1C  03/29/2019  . Eye exam for diabetics  10/20/2019  . Tetanus Vaccine  02/26/2023  . Flu Shot  Completed  . Pneumonia vaccines  Completed

## 2019-03-27 ENCOUNTER — Ambulatory Visit: Payer: PPO | Attending: Internal Medicine

## 2019-03-27 DIAGNOSIS — Z23 Encounter for immunization: Secondary | ICD-10-CM | POA: Insufficient documentation

## 2019-03-27 NOTE — Progress Notes (Signed)
   Covid-19 Vaccination Clinic  Name:  Terry [name]    MRN: [id number] DOB: [birth year]  03/27/2019  [name] was observed post Covid-19 immunization for 15 minutes without incidence. He was provided with Vaccine Information Sheet and instruction to access the V-Safe system.   [name] was instructed to call 911 with any severe reactions post vaccine: Marland Kitchen Difficulty breathing  . Swelling of your face and throat  . A fast heartbeat  . A bad rash all over your body  . Dizziness and weakness    Immunizations Administered    Name Date Dose VIS Date Route   Pfizer COVID-19 Vaccine 03/27/2019 10:03 AM 0.3 mL 01/09/2019 Intramuscular   Manufacturer: [place]   Lot: [id number]   [place]: [id number]

## 2019-04-08 ENCOUNTER — Telehealth (INDEPENDENT_AMBULATORY_CARE_PROVIDER_SITE_OTHER): Payer: PPO | Admitting: Internal Medicine

## 2019-04-08 ENCOUNTER — Telehealth: Payer: Self-pay | Admitting: Internal Medicine

## 2019-04-08 ENCOUNTER — Encounter: Payer: Self-pay | Admitting: Internal Medicine

## 2019-04-08 DIAGNOSIS — E119 Type 2 diabetes mellitus without complications: Secondary | ICD-10-CM | POA: Diagnosis not present

## 2019-04-08 DIAGNOSIS — R809 Proteinuria, unspecified: Secondary | ICD-10-CM | POA: Diagnosis not present

## 2019-04-08 DIAGNOSIS — J449 Chronic obstructive pulmonary disease, unspecified: Secondary | ICD-10-CM | POA: Diagnosis not present

## 2019-04-08 DIAGNOSIS — I10 Essential (primary) hypertension: Secondary | ICD-10-CM

## 2019-04-08 DIAGNOSIS — E1129 Type 2 diabetes mellitus with other diabetic kidney complication: Secondary | ICD-10-CM

## 2019-04-08 DIAGNOSIS — E0520 Thyrotoxicosis with toxic multinodular goiter without thyrotoxic crisis or storm: Secondary | ICD-10-CM | POA: Diagnosis not present

## 2019-04-08 DIAGNOSIS — E0590 Thyrotoxicosis, unspecified without thyrotoxic crisis or storm: Secondary | ICD-10-CM

## 2019-04-08 DIAGNOSIS — E785 Hyperlipidemia, unspecified: Secondary | ICD-10-CM | POA: Diagnosis not present

## 2019-04-08 NOTE — Telephone Encounter (Signed)
Lm on cell # to call office to set up a 2 hr fasting lab and a 56m follow up.

## 2019-04-08 NOTE — Assessment & Plan Note (Signed)
Managed with ACE I previously,  Now on an ARB   Lab Results  Component Value Date   CREATININE 1.42 09/29/2018   Lab Results  Component Value Date   MICROALBUR 18.1 (H) 03/24/2018

## 2019-04-08 NOTE — Assessment & Plan Note (Signed)
Previously managed by Dr [name],  [facility],  with methimazole, until her departure. Most recent TSH is vastly different than former TSH despite having had no medication changes,  but with increased  adherence to daily medication.  No changes were made in February given  his lack of hypothyroid symptoms  . Repeat assessment is overdue   Lab Results  Component Value Date   TSH 3.43 09/29/2018

## 2019-04-08 NOTE — Progress Notes (Signed)
Virtual Visit via [place]  This visit type was conducted due to national recommendations for restrictions regarding the COVID-19 pandemic (e.g. social distancing).  This format is felt to be most appropriate for this patient at this time.  All issues noted in this document were discussed and addressed.  No physical exam was performed (except for noted visual exam findings with Video Visits).   I connected with@ on 04/08/19 at 11:00 AM EST by a video enabled telemedicine application  and verified that I am speaking with the correct person using two identifiers. Location patient: home Location provider: work or home office Persons participating in the virtual visit: patient, provider and patient's wife Rise Paganini  I discussed the limitations, risks, security and privacy concerns of performing an evaluation and management service by telephone and the availability of in person appointments. I also discussed with the patient that there may be a patient responsible charge related to this service. The patient expressed understanding and agreed to proceed.   Reason for visit: follow up   HPI:  86 yr old male with advanced COPD, hyperthyroidism, and type 2 DM presents for 6 month follow up    COPD:  "I'm Not getting any better."   Gets SOB with position change ( standing up) for a half minute .  Not dizzy or presyncopal .  Better after 5 or 6 breaths.  Not wearing oxygen anymore. Not checking pulse ox but owns one.  Has not been walking,  Working or exercising.  Lifestyle sedentary,  But denies depression, wife agrees.    DM:  Not checking blood sugars.  Weight stable   ROS: See pertinent positives and negatives per HPI.  Past Medical History:  Diagnosis Date  . Anemia   . COPD (chronic obstructive pulmonary disease) ([place])   . Diabetes mellitus 2008  . GERD (gastroesophageal reflux disease)   . Hyperlipidemia   . Hypertension   . hyperthyroidism   . Squamous cell carcinoma of skin of left  upper arm November 2015   Excised to negative margins, associated keratoacanthoma.  . 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 Dionne Milo. He is due for followup colonoscopy     Past Surgical History:  Procedure Laterality Date  . APPENDECTOMY  1970  . ARM SKIN LESION BIOPSY / EXCISION Left 2016  . CATARACT EXTRACTION, BILATERAL  2012   Porfilio  . COLONOSCOPY  12-10-12  . COLONOSCOPY W/ POLYPECTOMY  2011   Dr. Raina Mina  . COLONOSCOPY WITH PROPOFOL N/A 09/19/2016   Procedure: COLONOSCOPY WITH PROPOFOL;  Surgeon: Robert Bellow, MD;  [facility] END[facility]COPY;  Service: Endoscopy;  Laterality: N/A;  . ESOPHAGOGASTRODUODENOSCOPY (EGD) WITH PROPOFOL N/A 09/19/2016   Procedure: ESOPHAGOGASTRODUODENOSCOPY (EGD) WITH PROPOFOL;  Surgeon: Robert Bellow, MD;  Location: [facility] ENDOSCOPY;  Service: Endoscopy;  Laterality: N/A;    Family History  Problem Relation Age of Onset  . Hypertension Father   . Diabetes Maternal Aunt   . Hyperlipidemia Maternal Uncle   . Birth defects Neg Hx     SOCIAL HX:  reports that he quit smoking about 19 years ago. His smoking use included cigarettes. He has a 100.00 pack-year smoking history. He has never used smokeless tobacco. He reports previous alcohol use. He reports that he does not use drugs.   Current Outpatient Medications:  .  aspirin EC 81 MG tablet, Take 1 tablet (81 mg total) by mouth daily., Disp: 90 tablet, Rfl: 3 .  b complex vitamins  tablet, Take 1 tablet by mouth daily., Disp: , Rfl:  .  COMBIVENT RESPIMAT 20-100 MCG/ACT AERS respimat, Use as directed 1 puff in the mouth or throat 4 (four) times daily as needed., Disp: , Rfl: 2 .  cyanocobalamin 1000 MCG tablet, Take 1,000 mcg by mouth daily., Disp: , Rfl:  .  metFORMIN (GLUCOPHAGE) 500 MG tablet, Take 1 tablet (500 mg total) by mouth 2 (two) times daily with a meal., Disp: 180 tablet, Rfl: 3 .  methimazole (TAPAZOLE) 5 MG tablet, TAKE 1 TABLET BY MOUTH  EVERY DAY, Disp: 90 tablet, Rfl: 1 .  metoprolol succinate (TOPROL-XL) 50 MG 24 hr tablet, TAKE 1 TABLET (50 MG TOTAL) BY MOUTH DAILY., Disp: 90 tablet, Rfl: 1 .  omeprazole (PRILOSEC) 40 MG capsule, TAKE 1 CAPSULE BY MOUTH EVERY DAY, Disp: 90 capsule, Rfl: 1 .  sildenafil (REVATIO) 20 MG tablet, Take 1 tablet (20 mg total) by mouth 3 (three) times daily., Disp: 50 tablet, Rfl: 5 .  simvastatin (ZOCOR) 40 MG tablet, TAKE 1 TABLET BY MOUTH EVERYDAY AT BEDTIME, Disp: 90 tablet, Rfl: 3 .  tamsulosin (FLOMAX) 0.4 MG CAPS capsule, TAKE 1 CAPSULE (0.4 MG TOTAL) BY MOUTH DAILY., Disp: 90 capsule, Rfl: 1 .  telmisartan (MICARDIS) 80 MG tablet, Take 1 tablet (80 mg total) by mouth at bedtime., Disp: 90 tablet, Rfl: 1  Current Facility-Administered Medications:  .  ipratropium-albuterol (DUONEB) 0.5-2.5 (3) MG/3ML nebulizer solution 3 mL, 3 mL, Nebulization, Q6H, Arnett, Yvetta Coder, FNP  EXAM:  VITALS per patient if applicable:  GENERAL: alert, oriented, appears well and in no acute distress  HEENT: atraumatic, conjunttiva clear, no obvious abnormalities on inspection of external nose and ears  NECK: normal movements of the head and neck  LUNGS: on inspection no signs of respiratory distress, breathing rate appears normal, no obvious gross SOB, gasping or wheezing  CV: no obvious cyanosis  MS: moves all visible extremities without noticeable abnormality  PSYCH/NEURO: pleasant and cooperative, no obvious depression or anxiety, speech and thought processing grossly intact  ASSESSMENT AND PLAN:  Discussed the following assessment and plan:  Hyperthyroidism - Plan: TSH  Hyperlipidemia with target LDL less than 70 - Plan: Lipid panel  Diabetes mellitus type 2, noninsulin dependent (HCC) - Plan: Hemoglobin A1c, Microalbumin / creatinine urine ratio  Essential hypertension - Plan: Comprehensive metabolic panel  Microalbuminuria due to type 2 diabetes mellitus (HCC)  COPD, moderate  (HCC)  Toxic multinodular goiter  Diabetes mellitus type 2, noninsulin dependent ([place]) Managed with metformin 500 mg bid.  Labs needed .   Up to date on eye exams.   Lab Results  Component Value Date   HGBA1C 6.9 (H) 09/29/2018   Lab Results  Component Value Date   MICROALBUR 18.1 (H) 03/24/2018   MICROALBUR 12.2 (H) 03/12/2016       Microalbuminuria due to type 2 diabetes mellitus ([place]) Managed with ACE I previously,  Now on an ARB   Lab Results  Component Value Date   CREATININE 1.42 09/29/2018   Lab Results  Component Value Date   MICROALBUR 18.1 (H) 03/24/2018    COPD, moderate With PFTs noting moderate airway disease due to air trapping and hyperinflation.  Continue use of combivent nebulizer for cost savings (unable to afford Spiriva)  Encouraged to waal more as his ambulatory sats at home today demonstrated an improvement from 85% at rest to 94%   Toxic multinodular goiter Previously managed by Dr [facility],  with methimazole, until  her departure. Most recent TSH is vastly different than former TSH despite having had no medication changes,  but with increased  adherence to daily medication.  No changes were made in February given  his lack of hypothyroid symptoms  . Repeat assessment is overdue   Lab Results  Component Value Date   TSH 3.43 09/29/2018     Hypertension she reports compliance with medication regimen  but has not checked BP since last visit .  She is not using NSAIDs daily.  Discussed goal 130/80 for patients over 70  to preserve renal function.  She has been asked to check her  BP  at home and  submit readings for evaluation. Renal function, electrolytes and screen for proteinuria are all normal .    I discussed the assessment and treatment plan with the patient. The patient was provided an opportunity to ask questions and all were answered. The patient agreed with the plan and demonstrated an understanding of the  instructions.   The patient was advised to call back or seek an in-person evaluation if the symptoms worsen or if the condition fails to improve as anticipated.    I provided  30 minutes of non-face-to-face time during this encounter reviewing patient's current problems and past surgeries, labs and imaging studies, providing counseling on the above mentioned problems , and coordination  of care .  [name], MD

## 2019-04-08 NOTE — Patient Instructions (Signed)
Please check your blood pressure and send me the readings  Check your pulse ox 2-3 times per week and let me know if the numbers drop below 88% with walking   a lower pulse ox when you are resting in  The chair means you are resting too much! Get up ad walk around to expand your lungs

## 2019-04-08 NOTE — Assessment & Plan Note (Addendum)
With PFTs noting moderate airway disease due to air trapping and hyperinflation.  Continue use of combivent nebulizer for cost savings (unable to afford Spiriva)  Encouraged to waal more as his ambulatory sats at home today demonstrated an improvement from 85% at rest to 94%

## 2019-04-08 NOTE — Assessment & Plan Note (Signed)
Managed with metformin 500 mg bid.  Labs needed .   Up to date on eye exams.   Lab Results  Component Value Date   HGBA1C 6.9 (H) 09/29/2018   Lab Results  Component Value Date   MICROALBUR 18.1 (H) 03/24/2018   MICROALBUR 12.2 (H) 03/12/2016

## 2019-04-08 NOTE — Assessment & Plan Note (Signed)
she reports compliance with medication regimen  but has not checked BP since last visit .  She is not using NSAIDs daily.  Discussed goal 130/80 for patients over 70  to preserve renal function.  She has been asked to check her  BP  at home and  submit readings for evaluation. Renal function, electrolytes and screen for proteinuria are all normal .

## 2019-04-10 ENCOUNTER — Other Ambulatory Visit: Payer: Self-pay | Admitting: Internal Medicine

## 2019-04-10 NOTE — Telephone Encounter (Signed)
170/98 pulse 77 and second day it was 170/93 pulse 72 Taking Telmisartan 80 mg  and Metoprolol succinate 50 mg at bedtime daily>

## 2019-04-12 ENCOUNTER — Other Ambulatory Visit: Payer: Self-pay | Admitting: Internal Medicine

## 2019-04-12 MED ORDER — AMLODIPINE BESYLATE 5 MG PO TABS: 5.0000 mg | ORAL_TABLET | Freq: Every day | ORAL | 3 refills | Status: DC

## 2019-04-22 ENCOUNTER — Ambulatory Visit: Payer: PPO | Attending: Internal Medicine

## 2019-04-22 DIAGNOSIS — Z23 Encounter for immunization: Secondary | ICD-10-CM

## 2019-04-22 NOTE — Progress Notes (Signed)
   Covid-19 Vaccination Clinic  Name:  JEROMI ACHORD    MRN: [id number] DOB: 1934-01-01  04/22/2019  Mr. Hollander was observed post Covid-19 immunization for 15 minutes without incident. He was provided with Vaccine Information Sheet and instruction to access the V-Safe system.   Mr. Lehr was instructed to call 911 with any severe reactions post vaccine: Marland Kitchen Difficulty breathing  . Swelling of face and throat  . A fast heartbeat  . A bad rash all over body  . Dizziness and weakness   Immunizations Administered    Name Date Dose VIS Date Route   Pfizer COVID-19 Vaccine 04/22/2019  1:22 PM 0.3 mL 01/09/2019 Intramuscular   Manufacturer: [place]   Lot: [id number]   [place]: [id number]

## 2019-07-23 ENCOUNTER — Other Ambulatory Visit: Payer: Self-pay | Admitting: Internal Medicine

## 2019-10-21 DIAGNOSIS — E119 Type 2 diabetes mellitus without complications: Secondary | ICD-10-CM | POA: Diagnosis not present

## 2019-10-21 LAB — HM DIABETES EYE EXAM

## 2019-10-24 ENCOUNTER — Other Ambulatory Visit: Payer: Self-pay | Admitting: Internal Medicine

## 2019-12-13 ENCOUNTER — Other Ambulatory Visit: Payer: Self-pay | Admitting: Internal Medicine

## 2019-12-17 ENCOUNTER — Other Ambulatory Visit: Payer: Self-pay

## 2019-12-17 ENCOUNTER — Ambulatory Visit (INDEPENDENT_AMBULATORY_CARE_PROVIDER_SITE_OTHER): Payer: PPO

## 2019-12-17 DIAGNOSIS — Z23 Encounter for immunization: Secondary | ICD-10-CM | POA: Diagnosis not present

## 2019-12-18 ENCOUNTER — Other Ambulatory Visit: Payer: Self-pay | Admitting: Internal Medicine

## 2020-01-21 ENCOUNTER — Other Ambulatory Visit: Payer: Self-pay | Admitting: Internal Medicine

## 2020-03-23 ENCOUNTER — Ambulatory Visit (INDEPENDENT_AMBULATORY_CARE_PROVIDER_SITE_OTHER): Payer: PPO

## 2020-03-23 VITALS — Ht 71.5 in | Wt 165.0 lb

## 2020-03-23 DIAGNOSIS — Z0000 Encounter for general adult medical examination without abnormal findings: Secondary | ICD-10-CM | POA: Diagnosis not present

## 2020-03-23 NOTE — Progress Notes (Signed)
Subjective:   Terry [name] is a 87 y.o. male who presents for Medicare Annual/Subsequent preventive examination.  Review of Systems    No ROS.  Medicare Wellness Virtual Visit.      Objective:    Today's Vitals   03/23/20 0911  Weight: 165 lb (74.8 kg)  Height: 5' 11.5" (1.816 m)   Body mass index is 22.69 kg/m.  Advanced Directives 03/23/2020 03/23/2019 03/18/2018 11/06/2016 09/19/2016  Does Patient Have a Medical Advance Directive? Yes Yes Yes Yes No  Type of Advance Directive - - Living will [place];Living will -  Does patient want to make changes to medical advance directive? No - Patient declined No - Patient declined No - Patient declined No - Patient declined -  Copy of [place] in Chart? - - - No - copy requested -  Would patient like information on creating a medical advance directive? - - - - No - Patient declined    Current Medications (verified) Outpatient Encounter Medications as of 03/23/2020  Medication Sig  . amLODipine (NORVASC) 5 MG tablet Take 1 tablet (5 mg total) by mouth daily.  Marland Kitchen aspirin EC 81 MG tablet Take 1 tablet (81 mg total) by mouth daily.  Marland Kitchen b complex vitamins tablet Take 1 tablet by mouth daily.  . COMBIVENT RESPIMAT 20-100 MCG/ACT AERS respimat Use as directed 1 puff in the mouth or throat 4 (four) times daily as needed.  . cyanocobalamin 1000 MCG tablet Take 1,000 mcg by mouth daily.  . metFORMIN (GLUCOPHAGE) 500 MG tablet TAKE 1 TABLET (500 MG TOTAL) BY MOUTH 2 (TWO) TIMES DAILY WITH A MEAL.  . methimazole (TAPAZOLE) 5 MG tablet TAKE 1 TABLET BY MOUTH EVERY DAY  . metoprolol succinate (TOPROL-XL) 50 MG 24 hr tablet TAKE 1 TABLET (50 MG TOTAL) BY MOUTH DAILY.  Marland Kitchen omeprazole (PRILOSEC) 40 MG capsule TAKE 1 CAPSULE BY MOUTH EVERY DAY  . sildenafil (REVATIO) 20 MG tablet Take 1 tablet (20 mg total) by mouth 3 (three) times daily.  . simvastatin (ZOCOR) 40 MG tablet TAKE 1 TABLET BY MOUTH EVERYDAY AT BEDTIME   . tamsulosin (FLOMAX) 0.4 MG CAPS capsule TAKE 1 CAPSULE (0.4 MG TOTAL) BY MOUTH DAILY.  Marland Kitchen telmisartan (MICARDIS) 80 MG tablet TAKE 1 TABLET BY MOUTH EVERYDAY AT BEDTIME   Facility-Administered Encounter Medications as of 03/23/2020  Medication  . ipratropium-albuterol (DUONEB) 0.5-2.5 (3) MG/3ML nebulizer solution 3 mL    Allergies (verified) Patient has no known allergies.   History: Past Medical History:  Diagnosis Date  . Anemia   . COPD (chronic obstructive pulmonary disease) ([place])   . Diabetes mellitus 2008  . GERD (gastroesophageal reflux disease)   . Hyperlipidemia   . Hypertension   . hyperthyroidism   . Squamous cell carcinoma of skin of left upper arm November 2015   Excised to negative margins, associated keratoacanthoma.  . 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 Dionne Milo. He is due for followup colonoscopy    Past Surgical History:  Procedure Laterality Date  . APPENDECTOMY  1970  . ARM SKIN LESION BIOPSY / EXCISION Left 2016  . CATARACT EXTRACTION, BILATERAL  2012   [facility]  . COLONOSCOPY  12-10-12  . COLONOSCOPY W/ POLYPECTOMY  2011   Dr. Raina Mina  . COLONOSCOPY WITH PROPOFOL N/A 09/19/2016   Procedure: COLONOSCOPY WITH PROPOFOL;  Surgeon: Robert Bellow, MD;  Location: [facility] ENDOSCOPY;  Service: Endoscopy;  Laterality: N/A;  .  ESOPHAGOGASTRODUODENOSCOPY (EGD) WITH PROPOFOL N/A 09/19/2016   Procedure: ESOPHAGOGASTRODUODENOSCOPY (EGD) WITH PROPOFOL;  Surgeon: Robert Bellow, MD;  Location: [facility] ENDOSCOPY;  Service: Endoscopy;  Laterality: N/A;   Family History  Problem Relation Age of Onset  . Hypertension Father   . Diabetes Maternal Aunt   . Hyperlipidemia Maternal Uncle   . Birth defects Neg Hx    Social History   Socioeconomic History  . Marital status: Married    Spouse name: Not on file  . Number of children: Not on file  . Years of education: Not on file  . Highest education level: Not on file   Occupational History  . Not on file  Tobacco Use  . Smoking status: Former Smoker    Packs/day: 2.00    Years: 50.00    Pack years: 100.00    Types: Cigarettes    Quit date: 06/26/1999    Years since quitting: 20.7  . Smokeless tobacco: Never Used  Vaping Use  . Vaping Use: Never used  Substance and Sexual Activity  . Alcohol use: Not Currently  . Drug use: No  . Sexual activity: Yes  Other Topics Concern  . Not on file  Social History Narrative  . Not on file   Social Determinants of Health   Financial Resource Strain: Low Risk   . Difficulty of Paying Living Expenses: Not hard at all  Food Insecurity: No Food Insecurity  . Worried About Charity fundraiser in the Last Year: Never true  . Ran Out of Food in the Last Year: Never true  Transportation Needs: No Transportation Needs  . Lack of Transportation (Medical): No  . Lack of Transportation (Non-Medical): No  Physical Activity: Insufficiently Active  . Days of Exercise per Week: 5 days  . Minutes of Exercise per Session: 20 min  Stress: No Stress Concern Present  . Feeling of Stress : Not at all  Social Connections: Unknown  . Frequency of Communication with Friends and Family: Not on file  . Frequency of Social Gatherings with Friends and Family: Not on file  . Attends Religious Services: Not on file  . Active Member of Clubs or Organizations: Not on file  . Attends Archivist Meetings: Not on file  . Marital Status: Married    Tobacco Counseling Counseling given: Not Answered   Clinical Intake:  Pre-visit preparation completed: Yes        Diabetes: Yes  How often do you need to have someone help you when you read instructions, pamphlets, or other written materials from your doctor or pharmacy?: 1 - Never  Nutrition Risk Assessment: Has the patient had any N/V/D within the last 2 weeks?  No  Does the patient have any non-healing wounds?  No  Has the patient had any unintentional weight  loss or weight gain?  No   Diabetes: If diabetic, was a CBG obtained today?  No  Did the patient bring in their glucometer from home?  No  How often do you monitor your CBG's? Only in office.   Financial Strains and Diabetes Management: Are you having any financial strains with the device, your supplies or your medication? No .  Does the patient want to be seen by Chronic Care Management for management of their diabetes?  No  Would the patient like to be referred to a Nutritionist or for Diabetic Management?  No    Interpreter Needed?: No      Activities of Daily Living In your  present state of health, do you have any difficulty performing the following activities: 03/23/2020  Hearing? N  Vision? N  Difficulty concentrating or making decisions? N  Walking or climbing stairs? N  Dressing or bathing? N  Doing errands, shopping? N  Preparing Food and eating ? N  Using the Toilet? N  In the past six months, have you accidently leaked urine? N  Do you have problems with loss of bowel control? N  Managing your Medications? N  Managing your Finances? N  Housekeeping or managing your Housekeeping? N  Some recent data might be hidden    Patient Care Team: [name], MD as PCP - General (Internal Medicine) Bary Castilla, Forest Gleason, MD (General Surgery) [name], MD (Internal Medicine) Wellington Hampshire, MD as Consulting Physician (Cardiology)  Indicate any recent Medical Services you may have received from other than [facility] providers in the past year (date may be approximate).     Assessment:   This is a routine wellness examination for Ethelbert.  I connected with Emmanuel today by telephone and verified that I am speaking with the correct person using two identifiers. Location patient: home Location provider: work Persons participating in the virtual visit: patient, Marine scientist.    I discussed the limitations, risks, security and privacy concerns of performing an evaluation  and management service by telephone and the availability of in person appointments. The patient expressed understanding and verbally consented to this telephonic visit.    Interactive audio and video telecommunications were attempted between this provider and patient, however failed, due to patient having technical difficulties OR patient did not have access to video capability.  We continued and completed visit with audio only.  Some vital signs may be absent or patient reported.   Hearing/Vision screen  Hearing Screening   125Hz  250Hz  500Hz  1000Hz  2000Hz  3000Hz  4000Hz  6000Hz  8000Hz   Right ear:           Left ear:           Comments: Patient is able to hear conversational tones without difficulty. No issues reported.  Vision Screening Comments: Followed by [facility]  Wears corrective lenses  No retinopathy reported  Annual visits  Cataract extraction, bilateral   Dietary issues and exercise activities discussed: Current Exercise Habits: Home exercise routine, Intensity: Mild  Regular diet Fair water intake  Goals      Patient Stated   .  Gain weight (pt-stated)      Healthy diet Stay active      Depression Screen PHQ 2/9 Scores 03/23/2020 03/23/2019 03/18/2018 11/06/2016 07/18/2016 01/02/2016  PHQ - 2 Score 0 0 0 0 0 0    Fall Risk Fall Risk  03/23/2020 04/08/2019 03/23/2019 03/18/2018 11/06/2016  Falls in the past year? 0 0 0 0 No  Number falls in past yr: 0 - - - -  Injury with Fall? 0 - - - -  Follow up Falls evaluation completed Falls evaluation completed Falls evaluation completed - -    FALL RISK PREVENTION PERTAINING TO THE HOME: Handrails in use when climbing stairs? Yes Home free of loose throw rugs in walkways, pet beds, electrical cords, etc? Yes  Adequate lighting in your home to reduce risk of falls? Yes   ASSISTIVE DEVICES UTILIZED TO PREVENT FALLS: Life alert? No  Use of a cane, walker or w/c? No   TIMED UP AND GO: Was the test performed? No . Virtual  visit.   Cognitive Function: Patient is alert and oriented  x3.  Denies difficulty focusing, making decisions, memory loss.  MMSE/6CIT deferred. Normal by direct communication/observation.  MMSE - Mini Mental State Exam 11/06/2016  Orientation to time 5  Orientation to Place 5  Registration 3  Attention/ Calculation 5  Recall 3  Language- name 2 objects 2  Language- repeat 1  Language- follow 3 step command 3  Language- read & follow direction 1  Write a sentence 1  Copy design 1  Total score 30     6CIT Screen 03/23/2019 03/18/2018  What Year? 0 points 0 points  What month? 0 points 0 points  What time? 0 points 0 points  Count back from 20 - 0 points  Months in reverse 0 points 0 points  Repeat phrase - 0 points  Total Score - 0    Immunizations Immunization History  Administered Date(s) Administered  . Fluad Quad(high Dose 65+) 12/23/2018, 12/17/2019  . Influenza, High Dose Seasonal PF 01/17/2015, 11/06/2016, 10/08/2017  . Influenza,inj,Quad PF,6+ Mos 10/02/2012, 11/12/2013  . Influenza-Unspecified 10/30/2015  . PFIZER(Purple Top)SARS-COV-2 Vaccination 03/27/2019, 04/22/2019  . Pneumococcal Conjugate-13 11/12/2013  . Pneumococcal Polysaccharide-23 10/02/2012  . Tdap 02/25/2013   Health Maintenance Health Maintenance  Topic Date Due  . FOOT EXAM  03/29/2019  . HEMOGLOBIN A1C  03/29/2019  . COVID-19 Vaccine (3 - Booster for Pfizer series) 10/23/2019  . OPHTHALMOLOGY EXAM  10/20/2020  . TETANUS/TDAP  02/26/2023  . INFLUENZA VACCINE  Completed  . PNA vac Low Risk Adult  Completed   Colorectal cancer screening: No longer required.   Lung Cancer Screening: (Low Dose CT Chest recommended if Age 48-80 years, 30 pack-year currently smoking OR have quit w/in 15years.) does not qualify.   Hepatitis C Screening: does not qualify.  Vision Screening: Recommended annual ophthalmology exams for early detection of glaucoma and other disorders of the eye. Is the patient up  to date with their annual eye exam?  Yes   Dental Screening: Recommended annual dental exams for proper oral hygiene.  Community Resource Referral / Chronic Care Management: CRR required this visit?  No   CCM required this visit?  No      Plan:    Keep all routine maintenance appointments.   Follow up 04/07/20 @ 8:00  I have personally reviewed and noted the following in the patient's chart:   . Medical and social history . Use of alcohol, tobacco or illicit drugs  . Current medications and supplements . Functional ability and status . Nutritional status . Physical activity . Advanced directives . List of other physicians . Hospitalizations, surgeries, and ER visits in previous 12 months . Vitals . Screenings to include cognitive, depression, and falls . Referrals and appointments  In addition, I have reviewed and discussed with patient certain preventive protocols, quality metrics, and best practice recommendations. A written personalized care plan for preventive services as well as general preventive health recommendations were provided to patient via mychart.     Varney Biles, LPN   5/62/1308

## 2020-03-23 NOTE — Patient Instructions (Addendum)
Terry [name] , Thank you for taking time to come for your Medicare Wellness Visit. I appreciate your ongoing commitment to your health goals. Please review the following plan we discussed and let me know if I can assist you in the future.   These are the goals we discussed: Goals      Patient Stated   .  Gain weight (pt-stated)      Healthy diet Stay active       This is a list of the screening recommended for you and due dates:  Health Maintenance  Topic Date Due  . Complete foot exam   03/29/2019  . Hemoglobin A1C  03/29/2019  . COVID-19 Vaccine (3 - Booster for Pfizer series) 10/23/2019  . Eye exam for diabetics  10/20/2020  . Tetanus Vaccine  02/26/2023  . Flu Shot  Completed  . Pneumonia vaccines  Completed    Immunizations Immunization History  Administered Date(s) Administered  . Fluad Quad(high Dose 65+) 12/23/2018, 12/17/2019  . Influenza, High Dose Seasonal PF 01/17/2015, 11/06/2016, 10/08/2017  . Influenza,inj,Quad PF,6+ Mos 10/02/2012, 11/12/2013  . Influenza-Unspecified 10/30/2015  . PFIZER(Purple Top)SARS-COV-2 Vaccination 03/27/2019, 04/22/2019  . Pneumococcal Conjugate-13 11/12/2013  . Pneumococcal Polysaccharide-23 10/02/2012  . Tdap 02/25/2013   Advanced directives: End of life planning; Advance aging; Advanced directives discussed.  Copy of current HCPOA/Living Will requested.    Conditions/risks identified: none new.   Follow up in one year for your annual wellness visit.   Keep all routine maintenance appointments.   Follow up 04/07/20 @ 8:00   Preventive Care 65 Years and Older, Male Preventive care refers to lifestyle choices and visits with your health care provider that can promote health and wellness. What does preventive care include?  A yearly physical exam. This is also called an annual well check.  Dental exams once or twice a year.  Routine eye exams. Ask your health care provider how often you should have your eyes  checked.  Personal lifestyle choices, including:  Daily care of your teeth and gums.  Regular physical activity.  Eating a healthy diet.  Avoiding tobacco and drug use.  Limiting alcohol use.  Practicing safe sex.  Taking low doses of aspirin every day.  Taking vitamin and mineral supplements as recommended by your health care provider. What happens during an annual well check? The services and screenings done by your health care provider during your annual well check will depend on your age, overall health, lifestyle risk factors, and family history of disease. Counseling  Your health care provider may ask you questions about your:  Alcohol use.  Tobacco use.  Drug use.  Emotional well-being.  Home and relationship well-being.  Sexual activity.  Eating habits.  History of falls.  Memory and ability to understand (cognition).  Work and work Statistician. Screening  You may have the following tests or measurements:  Height, weight, and BMI.  Blood pressure.  Lipid and cholesterol levels. These may be checked every 5 years, or more frequently if you are over 11 years old.  Skin check.  Lung cancer screening. You may have this screening every year starting at age 39 if you have a 30-pack-year history of smoking and currently smoke or have quit within the past 15 years.  Fecal occult blood test (FOBT) of the stool. You may have this test every year starting at age 18.  Flexible sigmoidoscopy or colonoscopy. You may have a sigmoidoscopy every 5 years or a colonoscopy every 10 years starting at  age 67.  Prostate cancer screening. Recommendations will vary depending on your family history and other risks.  Hepatitis C blood test.  Hepatitis B blood test.  Sexually transmitted disease (STD) testing.  Diabetes screening. This is done by checking your blood sugar (glucose) after you have not eaten for a while (fasting). You may have this done every 1-3  years.  Abdominal aortic aneurysm (AAA) screening. You may need this if you are a current or former smoker.  Osteoporosis. You may be screened starting at age 52 if you are at high risk. Talk with your health care provider about your test results, treatment options, and if necessary, the need for more tests. Vaccines  Your health care provider may recommend certain vaccines, such as:  Influenza vaccine. This is recommended every year.  Tetanus, diphtheria, and acellular pertussis (Tdap, Td) vaccine. You may need a Td booster every 10 years.  Zoster vaccine. You may need this after age 64.  Pneumococcal 13-valent conjugate (PCV13) vaccine. One dose is recommended after age 86.  Pneumococcal polysaccharide (PPSV23) vaccine. One dose is recommended after age 4. Talk to your health care provider about which screenings and vaccines you need and how often you need them. This information is not intended to replace advice given to you by your health care provider. Make sure you discuss any questions you have with your health care provider. Document Released: 02/11/2015 Document Revised: 10/05/2015 Document Reviewed: 11/16/2014 Elsevier Interactive Patient Education  2017 [employer] Prevention in the Home Falls can cause injuries. They can happen to people of all ages. There are many things you can do to make your home safe and to help prevent falls. What can I do on the outside of my home?  Regularly fix the edges of walkways and driveways and fix any cracks.  Remove anything that might make you trip as you walk through a door, such as a raised step or threshold.  Trim any bushes or trees on the path to your home.  Use bright outdoor lighting.  Clear any walking paths of anything that might make someone trip, such as rocks or tools.  Regularly check to see if handrails are loose or broken. Make sure that both sides of any steps have handrails.  Any raised decks and porches  should have guardrails on the edges.  Have any leaves, snow, or ice cleared regularly.  Use sand or salt on walking paths during winter.  Clean up any spills in your garage right away. This includes oil or grease spills. What can I do in the bathroom?  Use night lights.  Install grab bars by the toilet and in the tub and shower. Do not use towel bars as grab bars.  Use non-skid mats or decals in the tub or shower.  If you need to sit down in the shower, use a plastic, non-slip stool.  Keep the floor dry. Clean up any water that spills on the floor as soon as it happens.  Remove soap buildup in the tub or shower regularly.  Attach bath mats securely with double-sided non-slip rug tape.  Do not have throw rugs and other things on the floor that can make you trip. What can I do in the bedroom?  Use night lights.  Make sure that you have a light by your bed that is easy to reach.  Do not use any sheets or blankets that are too big for your bed. They should not hang down onto the  floor.  Have a firm chair that has side arms. You can use this for support while you get dressed.  Do not have throw rugs and other things on the floor that can make you trip. What can I do in the kitchen?  Clean up any spills right away.  Avoid walking on wet floors.  Keep items that you use a lot in easy-to-reach places.  If you need to reach something above you, use a strong step stool that has a grab bar.  Keep electrical cords out of the way.  Do not use floor polish or wax that makes floors slippery. If you must use wax, use non-skid floor wax.  Do not have throw rugs and other things on the floor that can make you trip. What can I do with my stairs?  Do not leave any items on the stairs.  Make sure that there are handrails on both sides of the stairs and use them. Fix handrails that are broken or loose. Make sure that handrails are as long as the stairways.  Check any carpeting to  make sure that it is firmly attached to the stairs. Fix any carpet that is loose or worn.  Avoid having throw rugs at the top or bottom of the stairs. If you do have throw rugs, attach them to the floor with carpet tape.  Make sure that you have a light switch at the top of the stairs and the bottom of the stairs. If you do not have them, ask someone to add them for you. What else can I do to help prevent falls?  Wear shoes that:  Do not have high heels.  Have rubber bottoms.  Are comfortable and fit you well.  Are closed at the toe. Do not wear sandals.  If you use a stepladder:  Make sure that it is fully opened. Do not climb a closed stepladder.  Make sure that both sides of the stepladder are locked into place.  Ask someone to hold it for you, if possible.  Clearly mark and make sure that you can see:  Any grab bars or handrails.  First and last steps.  Where the edge of each step is.  Use tools that help you move around (mobility aids) if they are needed. These include:  Canes.  Walkers.  Scooters.  Crutches.  Turn on the lights when you go into a dark area. Replace any light bulbs as soon as they burn out.  Set up your furniture so you have a clear path. Avoid moving your furniture around.  If any of your floors are uneven, fix them.  If there are any pets around you, be aware of where they are.  Review your medicines with your doctor. Some medicines can make you feel dizzy. This can increase your chance of falling. Ask your doctor what other things that you can do to help prevent falls. This information is not intended to replace advice given to you by your health care provider. Make sure you discuss any questions you have with your health care provider. Document Released: 11/11/2008 Document Revised: 06/23/2015 Document Reviewed: 02/19/2014 Elsevier Interactive Patient Education  2017 [employer].

## 2020-04-07 ENCOUNTER — Other Ambulatory Visit: Payer: Self-pay

## 2020-04-07 ENCOUNTER — Encounter: Payer: Self-pay | Admitting: Internal Medicine

## 2020-04-07 ENCOUNTER — Ambulatory Visit (INDEPENDENT_AMBULATORY_CARE_PROVIDER_SITE_OTHER): Payer: PPO | Admitting: Internal Medicine

## 2020-04-07 VITALS — BP 126/76 | HR 68 | Temp 97.6°F | Resp 15 | Ht 71.5 in | Wt 167.2 lb

## 2020-04-07 DIAGNOSIS — I208 Other forms of angina pectoris: Secondary | ICD-10-CM | POA: Diagnosis not present

## 2020-04-07 DIAGNOSIS — J449 Chronic obstructive pulmonary disease, unspecified: Secondary | ICD-10-CM

## 2020-04-07 DIAGNOSIS — I2510 Atherosclerotic heart disease of native coronary artery without angina pectoris: Secondary | ICD-10-CM | POA: Diagnosis not present

## 2020-04-07 DIAGNOSIS — I6523 Occlusion and stenosis of bilateral carotid arteries: Secondary | ICD-10-CM | POA: Diagnosis not present

## 2020-04-07 DIAGNOSIS — N183 Chronic kidney disease, stage 3 unspecified: Secondary | ICD-10-CM

## 2020-04-07 DIAGNOSIS — I129 Hypertensive chronic kidney disease with stage 1 through stage 4 chronic kidney disease, or unspecified chronic kidney disease: Secondary | ICD-10-CM

## 2020-04-07 DIAGNOSIS — E0520 Thyrotoxicosis with toxic multinodular goiter without thyrotoxic crisis or storm: Secondary | ICD-10-CM

## 2020-04-07 DIAGNOSIS — E1129 Type 2 diabetes mellitus with other diabetic kidney complication: Secondary | ICD-10-CM

## 2020-04-07 DIAGNOSIS — E785 Hyperlipidemia, unspecified: Secondary | ICD-10-CM

## 2020-04-07 DIAGNOSIS — E0590 Thyrotoxicosis, unspecified without thyrotoxic crisis or storm: Secondary | ICD-10-CM

## 2020-04-07 DIAGNOSIS — E1122 Type 2 diabetes mellitus with diabetic chronic kidney disease: Secondary | ICD-10-CM | POA: Diagnosis not present

## 2020-04-07 DIAGNOSIS — I2089 Other forms of angina pectoris: Secondary | ICD-10-CM

## 2020-04-07 DIAGNOSIS — E119 Type 2 diabetes mellitus without complications: Secondary | ICD-10-CM

## 2020-04-07 DIAGNOSIS — I2584 Coronary atherosclerosis due to calcified coronary lesion: Secondary | ICD-10-CM

## 2020-04-07 DIAGNOSIS — E1121 Type 2 diabetes mellitus with diabetic nephropathy: Secondary | ICD-10-CM | POA: Diagnosis not present

## 2020-04-07 DIAGNOSIS — I10 Essential (primary) hypertension: Secondary | ICD-10-CM | POA: Diagnosis not present

## 2020-04-07 DIAGNOSIS — R809 Proteinuria, unspecified: Secondary | ICD-10-CM

## 2020-04-07 LAB — LIPID PANEL
Cholesterol: 143 mg/dL (ref 0–200)
HDL: 57.4 mg/dL (ref >39.00)
LDL Cholesterol: 67 mg/dL (ref 0–99)
NonHDL: 85.26
Total CHOL/HDL Ratio: 2
Triglycerides: 89 mg/dL (ref 0.0–149.0)
VLDL: 17.8 mg/dL (ref 0.0–40.0)

## 2020-04-07 LAB — COMPREHENSIVE METABOLIC PANEL
ALT: 9 U/L (ref 0–53)
AST: 13 U/L (ref 0–37)
Albumin: 3.8 g/dL (ref 3.5–5.2)
Alkaline Phosphatase: 58 U/L (ref 39–117)
BUN: 16 mg/dL (ref 6–23)
CO2: 30 mEq/L (ref 19–32)
Calcium: 9.2 mg/dL (ref 8.4–10.5)
Chloride: 103 mEq/L (ref 96–112)
Creatinine, Ser: 1.49 mg/dL (ref 0.40–1.50)
GFR: 42.63 mL/min — ABNORMAL LOW (ref >60.00)
Glucose, Bld: 119 mg/dL — ABNORMAL HIGH (ref 70–99)
Potassium: 4.5 mEq/L (ref 3.5–5.1)
Sodium: 140 mEq/L (ref 135–145)
Total Bilirubin: 0.6 mg/dL (ref 0.2–1.2)
Total Protein: 6.3 g/dL (ref 6.0–8.3)

## 2020-04-07 LAB — MICROALBUMIN / CREATININE URINE RATIO
Creatinine,U: 138.5 mg/dL
Microalb Creat Ratio: 43.6 mg/g — ABNORMAL HIGH (ref 0.0–30.0)
Microalb, Ur: 60.3 mg/dL — ABNORMAL HIGH (ref 0.0–1.9)

## 2020-04-07 LAB — POCT GLYCOSYLATED HEMOGLOBIN (HGB A1C): Hemoglobin A1C: 6.1 % — AB (ref 4.0–5.6)

## 2020-04-07 LAB — TSH: TSH: 2.64 u[IU]/mL (ref 0.35–4.50)

## 2020-04-07 NOTE — Patient Instructions (Signed)
Parachute jumping is STRONGLY DISADVISED    Chronic Obstructive Pulmonary Disease  Chronic obstructive pulmonary disease (COPD) is a long-term (chronic) lung problem. When you have COPD, it is hard for air to get in and out of your lungs. Usually the condition gets worse over time, and your lungs will never return to normal. There are things you can do to keep yourself as healthy as possible. What are the causes?  Smoking. This is the most common cause.  Certain genes passed from parent to child (inherited). What increases the risk?  Being exposed to secondhand smoke from cigarettes, pipes, or cigars.  Being exposed to chemicals and other irritants, such as fumes and dust in the work environment.  Having chronic lung conditions or infections. What are the signs or symptoms?  Shortness of breath, especially during physical activity.  A long-term cough with a large amount of thick mucus. Sometimes, the cough may not have any mucus (dry cough).  Wheezing.  Breathing quickly.  Skin that looks gray or blue, especially in the fingers, toes, or lips.  Feeling tired (fatigue).  Weight loss.  Chest tightness.  Having infections often.  Episodes when breathing symptoms become much worse (exacerbations). At the later stages of this disease, you may have swelling in the ankles, feet, or legs. How is this treated?  Taking medicines.  Quitting smoking, if you smoke.  Rehabilitation. This includes steps to make your body work better. It may involve a team of specialists.  Doing exercises.  Making changes to your diet.  Using oxygen.  Lung surgery.  Lung transplant.  Comfort measures (palliative care). Follow these instructions at home: Medicines  Take over-the-counter and prescription medicines only as told by your doctor.  Talk to your doctor before taking any cough or allergy medicines. You may need to avoid medicines that cause your lungs to be  dry. Lifestyle  If you smoke, stop smoking. Smoking makes the problem worse.  Do not smoke or use any products that contain nicotine or tobacco. If you need help quitting, ask your doctor.  Avoid being around things that make your breathing worse. This may include smoke, chemicals, and fumes.  Stay active, but remember to rest as well.  Learn and use tips on how to manage stress and control your breathing.  Make sure you get enough sleep. Most adults need at least 7 hours of sleep every night.  Eat healthy foods. Eat smaller meals more often. Rest before meals. Controlled breathing Learn and use tips on how to control your breathing as told by your doctor. Try:  Breathing in (inhaling) through your nose for 1 second. Then, pucker your lips and breath out (exhale) through your lips for 2 seconds.  Putting one hand on your belly (abdomen). Breathe in slowly through your nose for 1 second. Your hand on your belly should move out. Pucker your lips and breathe out slowly through your lips. Your hand on your belly should move in as you breathe out.   Controlled coughing Learn and use controlled coughing to clear mucus from your lungs. Follow these steps: 1. Lean your head a little forward. 2. Breathe in deeply. 3. Try to hold your breath for 3 seconds. 4. Keep your mouth slightly open while coughing 2 times. 5. Spit any mucus out into a tissue. 6. Rest and do the steps again 1 or 2 times as needed. General instructions  Make sure you get all the shots (vaccines) that your doctor recommends. Ask your doctor  about a flu shot and a pneumonia shot.  Use oxygen therapy and pulmonary rehabilitation if told by your doctor. If you need home oxygen therapy, ask your doctor if you should buy a tool to measure your oxygen level (oximeter).  Make a COPD action plan with your doctor. This helps you to know what to do if you feel worse than usual.  Manage any other conditions you have as told by  your doctor.  Avoid going outside when it is very hot, cold, or humid.  Avoid people who have a sickness you can catch (contagious).  Keep all follow-up visits. Contact a doctor if:  You cough up more mucus than usual.  There is a change in the color or thickness of the mucus.  It is harder to breathe than usual.  Your breathing is faster than usual.  You have trouble sleeping.  You need to use your medicines more often than usual.  You have trouble doing your normal activities such as getting dressed or walking around the house. Get help right away if:  You have shortness of breath while resting.  You have shortness of breath that stops you from: ? Being able to talk. ? Doing normal activities.  Your chest hurts for longer than 5 minutes.  Your skin color is more blue than usual.  Your pulse oximeter shows that you have low oxygen for longer than 5 minutes.  You have a fever.  You feel too tired to breathe normally. These symptoms may represent a serious problem that is an emergency. Do not wait to see if the symptoms will go away. Get medical help right away. Call your local emergency services (911 in the [place] Do not drive yourself to the hospital. Summary  Chronic obstructive pulmonary disease (COPD) is a long-term lung problem.  The way your lungs work will never return to normal. Usually the condition gets worse over time. There are things you can do to keep yourself as healthy as possible.  Take over-the-counter and prescription medicines only as told by your doctor.  If you smoke, stop. Smoking makes the problem worse. This information is not intended to replace advice given to you by your health care provider. Make sure you discuss any questions you have with your health care provider. Document Revised: 11/24/2019 Document Reviewed: 11/24/2019 Elsevier Patient Education  2021 [employer].

## 2020-04-07 NOTE — Progress Notes (Signed)
Subjective:  Patient ID: Terry [name], male    DOB: [birth year]  Age: 87 y.o. MRN: [id number]  CC: The primary encounter diagnosis was Diabetic nephropathy associated with type 2 diabetes mellitus ([place]). 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  HPI STROTHER EVERITT presents for follow up on hypertension, Type 2 DM , COPD, and hyperlipidemia  This visit occurred during the SARS-CoV-2 public health emergency.  Safety protocols were in place, including screening questions prior to the visit, additional usage of staff PPE, and extensive cleaning of exam room while observing appropriate contact time as indicated for disinfecting solutions.   Patient Has not had labs since 2020.    Type 2 DM:  He is not physically active,  Not overweight . Taking metformin, simvastatin and telmisartan.  Does not check sugars.   COPD:  He is Not dyspneic at rest, and has  no morning headaches . Not using inhalers at all. Not physically active,  But disclosed that he plans to schedule a recreational  parachute jump that he has pre purchased in 2023-02-12. He has no death wish and lacks insight about the effect of high altitude on oxygenation in patients with advanced COPD .Refuses to wear oxygen  Patient is taking his medications as prescribed and notes no adverse effects.  Home BP readings have not been done.   She is avoiding added salt in her diet .  Not physically active due to symptomatic COPD with activity.    .   Lab Results  Component Value Date   HGBA1C 6.1 (A) 04/07/2020        Outpatient Medications Prior to Visit  Medication Sig Dispense Refill  . amLODipine (NORVASC) 5 MG tablet Take 1 tablet (5 mg total) by mouth daily. 90 tablet 3  . aspirin EC 81 MG tablet Take 1 tablet (81 mg total) by mouth daily. 90 tablet 3  . b complex vitamins tablet Take 1 tablet by mouth daily.    . COMBIVENT RESPIMAT 20-100 MCG/ACT AERS respimat Use as directed 1 puff in the mouth or throat 4 (four) times daily as needed.  2  . cyanocobalamin 1000 MCG tablet Take 1,000 mcg by mouth daily.    . metFORMIN (GLUCOPHAGE) 500 MG tablet TAKE 1 TABLET (500 MG TOTAL) BY MOUTH 2 (TWO) TIMES DAILY WITH A MEAL. 180 tablet 3  . methimazole (TAPAZOLE) 5 MG tablet TAKE 1 TABLET BY MOUTH EVERY DAY 90 tablet 1  . metoprolol succinate (TOPROL-XL) 50 MG 24 hr tablet TAKE 1 TABLET (50 MG TOTAL) BY MOUTH DAILY. 90 tablet 1  . omeprazole (PRILOSEC) 40 MG capsule TAKE 1 CAPSULE BY MOUTH EVERY DAY 90 capsule 1  . sildenafil (REVATIO) 20 MG tablet Take 1 tablet (20 mg total) by mouth 3 (three) times daily. 50 tablet 5  . simvastatin (ZOCOR) 40 MG tablet TAKE 1 TABLET BY MOUTH EVERYDAY AT BEDTIME 90 tablet 3  . tamsulosin (FLOMAX) 0.4 MG CAPS capsule TAKE 1 CAPSULE (0.4 MG TOTAL) BY MOUTH DAILY. 90 capsule 1  . telmisartan (MICARDIS) 80 MG tablet TAKE 1 TABLET BY MOUTH EVERYDAY AT BEDTIME 90 tablet 1  Facility-Administered Medications Prior to Visit  Medication Dose Route Frequency Provider Last Rate Last Admin  . ipratropium-albuterol (DUONEB) 0.5-2.5 (3) MG/3ML nebulizer solution 3 mL  3 mL Nebulization Q6H Burnard Hawthorne, FNP        Review of Systems;  Patient denies headache, fevers, malaise, unintentional weight loss, skin rash, eye pain, sinus congestion and sinus pain, sore throat, dysphagia,  hemoptysis , cough, dyspnea, wheezing, chest pain, palpitations, orthopnea, edema, abdominal pain, nausea, melena, diarrhea, constipation, flank pain, dysuria, hematuria, urinary  Frequency,  nocturia, numbness, tingling, seizures,  Focal weakness, Loss of consciousness,  Tremor, insomnia, depression, anxiety, and suicidal ideation.      Objective:  BP 126/76 (BP Location: Left Arm, Patient Position: Sitting, Cuff Size: Normal)   Pulse 68   Temp 97.6 F (36.4 C) (Oral)   Resp 15   Ht 5' 11.5" (1.816 m)   Wt 167 lb 3.2 oz (75.8 kg)   SpO2 91%   BMI 22.99 kg/m   BP Readings from Last 3 Encounters:  04/07/20 126/76  04/25/18 118/62  03/18/18 132/64    Wt Readings from Last 3 Encounters:  04/07/20 167 lb 3.2 oz (75.8 kg)  03/23/20 165 lb (74.8 kg)  03/23/19 165 lb (74.8 kg)    General appearance: alert, cooperative and appears stated age Ears: normal TM's and external ear canals both ears Throat: lips, mucosa, and tongue normal; teeth and gums normal Neck: no adenopathy, no carotid bruit, supple, symmetrical, trachea midline and thyroid not enlarged, symmetric, no tenderness/mass/nodules Back: symmetric, no curvature. ROM normal. No CVA tenderness. Lungs: diminished BS  Bilaterally without wheezing or ronchi.  Heart: regular rate and rhythm, S1, S2 normal, no murmur, click, rub or gallop Abdomen: soft, non-tender; bowel sounds normal; no masses,  no organomegaly Pulses: 2+ and symmetric Skin: Skin color, texture, turgor normal. No rashes or lesions Lymph nodes: Cervical, supraclavicular, and axillary nodes normal. Foot exam:  Nails are reasonably well trimmed, No callouses,  Sensation intact to microfilament  Lab Results  Component Value Date   HGBA1C 6.1 (A) 04/07/2020   HGBA1C 6.9 (H) 09/29/2018   HGBA1C 6.6 (H) 03/24/2018    Lab Results  Component Value Date   CREATININE 1.49 04/07/2020   CREATININE 1.42 09/29/2018   CREATININE 1.16 03/24/2018    Lab Results  Component Value Date   WBC 7.5 09/29/2018   HGB 12.0 (L) 09/29/2018   HCT 36.0 (L) 09/29/2018   PLT 313.0 09/29/2018   GLUCOSE 119 (H) 04/07/2020   CHOL 143 04/07/2020   TRIG 89.0  04/07/2020   HDL 57.40 04/07/2020   LDLDIRECT 49.0 03/12/2016   LDLCALC 67 04/07/2020   ALT 9 04/07/2020   AST 13 04/07/2020   NA 140 04/07/2020   K 4.5 04/07/2020   CL 103 04/07/2020   CREATININE 1.49 04/07/2020   BUN 16 04/07/2020   CO2 30 04/07/2020   TSH 2.64 04/07/2020   PSA 0.75 11/12/2013   INR 0.9 12/12/2012   HGBA1C 6.1 (A) 04/07/2020   MICROALBUR 60.3 (H) 04/07/2020    DG Chest 2 View  Result Date: 10/23/2016 CLINICAL DATA:  Right lower chest pain for 2 months, some weight loss, history of COPD EXAM: CHEST  2 VIEW COMPARISON:  Chest x-ray of 10/02/2012 FINDINGS: The lungs remain clear but hyperaerated suggestive of mild emphysematous change. Mediastinal and hilar contours are unremarkable. Minimal basilar linear scarring is present. No pneumonia or effusion is seen. The heart is within normal limits in size. Only mild  degenerative changes present in the lower thoracic spine for age. IMPRESSION: Hyperaeration may indicate mild emphysematous change. No active lung disease. Electronically Signed   By: Ivar Drape M.D.   On: 10/23/2016 08:39   US Abdomen Complete  Result Date: 10/23/2016 CLINICAL DATA:  Weight loss, abdominal pain EXAM: ABDOMEN ULTRASOUND COMPLETE COMPARISON:  CT 08/15/2016 FINDINGS: Gallbladder: No gallstones or wall thickening visualized. No sonographic Murphy sign noted by sonographer. Common bile duct: Diameter: Normal caliber, 3 mm. Liver: No focal lesion identified. Within normal limits in parenchymal echogenicity. Portal vein is patent on color Doppler imaging with normal direction of blood flow towards the liver. IVC: No abnormality visualized. Pancreas: Not well visualized due to overlying bowel gas. Spleen: Size and appearance within normal limits. Right Kidney: Length: 11.0 cm. 2.7 cm cyst off the lower pole. No hydronephrosis. Normal echotexture. Left Kidney: Length: 11.3 cm. Echogenicity within normal limits. No mass or hydronephrosis visualized. Abdominal  aorta: No aneurysm visualized. Other findings: None. IMPRESSION: No acute findings or significant abnormality. Electronically Signed   By: Rolm Baptise M.D.   On: 10/23/2016 09:13    Assessment & Plan:   Problem List Items Addressed This Visit      Unprioritized   Type 2 DM with CKD stage 3 and hypertension ([place])    Diabetes is well controlled with diet and metformin 500 mg bid.  He has been lost to nephrology follow up since 2019.;   Referral to nephrology for CKD advised.  Continue statin, ASA  and ARB  Lab Results  Component Value Date   HGBA1C 6.1 (A) 04/07/2020   Lab Results  Component Value Date   MICROALBUR 60.3 (H) 04/07/2020   MICROALBUR 18.1 (H) 03/24/2018           Toxic multinodular goiter    Previously managed by Dr Eustace [facility],  with methimazole, until her departure. TSH is at goal .  Continue current dose of  5 mg  Lab Results  Component Value Date   TSH 2.64 04/07/2020         Microalbuminuria due to type 2 diabetes mellitus (HCC)    Worsening,  Despite use of telmisartan , With decline in GFR noted.  Nephrology referral advised; awaiting affirmation by patient . Advised to avoid all NSAIDs. BP is at goal given his age.   Lab Results  Component Value Date   CREATININE 1.49 04/07/2020   Lab Results  Component Value Date   MICROALBUR 60.3 (H) 04/07/2020   MICROALBUR 18.1 (H) 03/24/2018           Hyperthyroidism   Hyperlipidemia with target LDL less than 70    LDL is at goal on simvastatin and   LFTs normal; no changes today   Lab Results  Component Value Date   CHOL 143 04/07/2020   HDL 57.40 04/07/2020   LDLCALC 67 04/07/2020   LDLDIRECT 49.0 03/12/2016   TRIG 89.0 04/07/2020   CHOLHDL 2 04/07/2020   Lab Results  Component Value Date   ALT 9 04/07/2020   AST 13 04/07/2020   ALKPHOS 58 04/07/2020   BILITOT 0.6 04/07/2020         Diabetic nephropathy associated with type 2 diabetes mellitus ([place]) - Primary     Managed with  ARB .  Overdue for follow up  Diabetes is well controlled In spite of being lost to follow up since Sept 2020   Lab Results  Component Value Date   HGBA1C 6.1 (A)  04/07/2020         Relevant Orders   POCT HgB A1C (Completed)   Coronary atherosclerosis due to calcified coronary lesion of native artery    Noted on 2018 CT scan.  No recent functional study or screen for ischemia.  He is sedentary and has no symptoms.  continue statin,   Asa , beta blocker and ARB      COPD, moderate ([place])    Advanced.  Refuses to use inhalers or oxygen.  Room air sats 91%. And he is asymptomatic at rest.  STRONGLY ADVISED NOT to parachute jump as planned .  Reviewed symptoms of nocturnal hypoxia and the effect of low oxygen tension at high altitudes which would likely result in syncope during the drop, as well as acute MI and possibly death. .      Bilateral carotid artery stenosis    Last assessment ws 2017 and moderate.  One year follow up was advised .  He is taking statin and ASA       Anginal equivalent ([place])    Other Visit Diagnoses    Essential hypertension         A total of 40 minutes was spent with patient in review of patient's chart and in a face to face visit, which was  was spent in counseling patient on the above mentioned issues , reviewing and explaining recent labs and imaging studies done, and coordination of care.  I am having [place]. Theodoro Clock" maintain his cyanocobalamin, b complex vitamins, Combivent Respimat, aspirin EC, sildenafil, tamsulosin, omeprazole, amLODipine, metFORMIN, telmisartan, simvastatin, methimazole, and metoprolol succinate. We will continue to administer ipratropium-albuterol.  No orders of the defined types were placed in this encounter.   There are no discontinued medications.  Follow-up: No follow-ups on file.   [name], MD

## 2020-04-07 NOTE — Assessment & Plan Note (Signed)
Managed with  ARB .  Overdue for follow up  Diabetes is well controlled In spite of being lost to follow up since Sept 2020   Lab Results  Component Value Date   HGBA1C 6.1 (A) 04/07/2020

## 2020-04-07 NOTE — Assessment & Plan Note (Addendum)
Advanced.  Refuses to use inhalers or oxygen.  Room air sats 91%. And he is asymptomatic at rest.  STRONGLY ADVISED NOT to parachute jump as planned .  Reviewed symptoms of nocturnal hypoxia and the effect of low oxygen tension at high altitudes which would likely result in syncope during the drop, as well as acute MI and possibly death. Marland Kitchen

## 2020-04-09 ENCOUNTER — Encounter: Payer: Self-pay | Admitting: Internal Medicine

## 2020-04-09 DIAGNOSIS — I2584 Coronary atherosclerosis due to calcified coronary lesion: Secondary | ICD-10-CM | POA: Insufficient documentation

## 2020-04-09 DIAGNOSIS — I2510 Atherosclerotic heart disease of native coronary artery without angina pectoris: Secondary | ICD-10-CM | POA: Insufficient documentation

## 2020-04-09 NOTE — Assessment & Plan Note (Signed)
Noted on 2018 CT scan.  No recent functional study or screen for ischemia.  He is sedentary and has no symptoms.  continue statin,   Asa , beta blocker and ARB 

## 2020-04-09 NOTE — Assessment & Plan Note (Addendum)
Diabetes is well controlled with diet and metformin 500 mg bid.  He has been lost to nephrology follow up since 2019.;   Referral to nephrology for CKD advised.  Continue statin, ASA  and ARB  Lab Results  Component Value Date   HGBA1C 6.1 (A) 04/07/2020   Lab Results  Component Value Date   MICROALBUR 60.3 (H) 04/07/2020   MICROALBUR 18.1 (H) 03/24/2018

## 2020-04-09 NOTE — Assessment & Plan Note (Addendum)
Worsening,  Despite use of telmisartan , With decline in GFR noted.  Nephrology referral advised; awaiting affirmation by patient . Advised to avoid all NSAIDs. BP is at goal given his age.   Lab Results  Component Value Date   CREATININE 1.49 04/07/2020   Lab Results  Component Value Date   MICROALBUR 60.3 (H) 04/07/2020   MICROALBUR 18.1 (H) 03/24/2018

## 2020-04-09 NOTE — Assessment & Plan Note (Signed)
LDL is at goal on simvastatin and   LFTs normal; no changes today   Lab Results  Component Value Date   CHOL 143 04/07/2020   HDL 57.40 04/07/2020   LDLCALC 67 04/07/2020   LDLDIRECT 49.0 03/12/2016   TRIG 89.0 04/07/2020   CHOLHDL 2 04/07/2020   Lab Results  Component Value Date   ALT 9 04/07/2020   AST 13 04/07/2020   ALKPHOS 58 04/07/2020   BILITOT 0.6 04/07/2020

## 2020-04-09 NOTE — Assessment & Plan Note (Signed)
Previously managed by Dr [name],  [facility],  with methimazole, until her departure. TSH is at goal .  Continue current dose of  5 mg  Lab Results  Component Value Date   TSH 2.64 04/07/2020

## 2020-04-09 NOTE — Assessment & Plan Note (Signed)
Last assessment ws 2017 and moderate.  One year follow up was advised .  He is taking statin and ASA

## 2020-04-11 ENCOUNTER — Telehealth: Payer: Self-pay | Admitting: Internal Medicine

## 2020-04-11 DIAGNOSIS — N183 Chronic kidney disease, stage 3 unspecified: Secondary | ICD-10-CM

## 2020-04-11 DIAGNOSIS — E1122 Type 2 diabetes mellitus with diabetic chronic kidney disease: Secondary | ICD-10-CM

## 2020-04-11 DIAGNOSIS — E1121 Type 2 diabetes mellitus with diabetic nephropathy: Secondary | ICD-10-CM

## 2020-04-11 NOTE — Telephone Encounter (Signed)
The patient called stating he wanted to be put in contact with the  Kidney specialist Dr. [name] recommended.

## 2020-04-13 NOTE — Addendum Note (Signed)
Addended by: [name] on: 04/13/2020 12:26 PM   Modules accepted: Orders

## 2020-04-26 ENCOUNTER — Other Ambulatory Visit: Payer: Self-pay | Admitting: Internal Medicine

## 2020-05-17 DIAGNOSIS — R801 Persistent proteinuria, unspecified: Secondary | ICD-10-CM | POA: Diagnosis not present

## 2020-05-17 DIAGNOSIS — N400 Enlarged prostate without lower urinary tract symptoms: Secondary | ICD-10-CM | POA: Diagnosis not present

## 2020-05-17 DIAGNOSIS — N1832 Chronic kidney disease, stage 3b: Secondary | ICD-10-CM | POA: Diagnosis not present

## 2020-05-17 DIAGNOSIS — I10 Essential (primary) hypertension: Secondary | ICD-10-CM | POA: Diagnosis not present

## 2020-05-17 DIAGNOSIS — E1122 Type 2 diabetes mellitus with diabetic chronic kidney disease: Secondary | ICD-10-CM | POA: Diagnosis not present

## 2020-06-28 ENCOUNTER — Other Ambulatory Visit: Payer: Self-pay | Admitting: Internal Medicine

## 2020-07-25 ENCOUNTER — Other Ambulatory Visit: Payer: Self-pay | Admitting: Internal Medicine

## 2020-10-24 DIAGNOSIS — E119 Type 2 diabetes mellitus without complications: Secondary | ICD-10-CM | POA: Diagnosis not present

## 2020-10-24 LAB — HM DIABETES EYE EXAM

## 2020-10-30 ENCOUNTER — Other Ambulatory Visit: Payer: Self-pay | Admitting: Internal Medicine

## 2020-11-02 ENCOUNTER — Ambulatory Visit (INDEPENDENT_AMBULATORY_CARE_PROVIDER_SITE_OTHER): Payer: PPO | Admitting: Internal Medicine

## 2020-11-02 ENCOUNTER — Encounter: Payer: Self-pay | Admitting: Internal Medicine

## 2020-11-02 ENCOUNTER — Other Ambulatory Visit: Payer: Self-pay

## 2020-11-02 VITALS — BP 158/68 | HR 116 | Temp 96.2°F | Ht 71.5 in | Wt 171.0 lb

## 2020-11-02 DIAGNOSIS — N183 Chronic kidney disease, stage 3 unspecified: Secondary | ICD-10-CM

## 2020-11-02 DIAGNOSIS — Z7189 Other specified counseling: Secondary | ICD-10-CM | POA: Diagnosis not present

## 2020-11-02 DIAGNOSIS — I129 Hypertensive chronic kidney disease with stage 1 through stage 4 chronic kidney disease, or unspecified chronic kidney disease: Secondary | ICD-10-CM

## 2020-11-02 DIAGNOSIS — Z23 Encounter for immunization: Secondary | ICD-10-CM

## 2020-11-02 DIAGNOSIS — J449 Chronic obstructive pulmonary disease, unspecified: Secondary | ICD-10-CM

## 2020-11-02 DIAGNOSIS — I6523 Occlusion and stenosis of bilateral carotid arteries: Secondary | ICD-10-CM

## 2020-11-02 DIAGNOSIS — E0590 Thyrotoxicosis, unspecified without thyrotoxic crisis or storm: Secondary | ICD-10-CM

## 2020-11-02 DIAGNOSIS — E1122 Type 2 diabetes mellitus with diabetic chronic kidney disease: Secondary | ICD-10-CM | POA: Diagnosis not present

## 2020-11-02 DIAGNOSIS — I10 Essential (primary) hypertension: Secondary | ICD-10-CM | POA: Diagnosis not present

## 2020-11-02 DIAGNOSIS — E1121 Type 2 diabetes mellitus with diabetic nephropathy: Secondary | ICD-10-CM

## 2020-11-02 DIAGNOSIS — Z66 Do not resuscitate: Secondary | ICD-10-CM | POA: Diagnosis not present

## 2020-11-02 MED ORDER — ALBUTEROL SULFATE HFA 108 (90 BASE) MCG/ACT IN AERS: 2.0000 | INHALATION_SPRAY | Freq: Four times a day (QID) | RESPIRATORY_TRACT | 11 refills | Status: DC | PRN

## 2020-11-02 MED ORDER — IPRATROPIUM-ALBUTEROL 0.5-2.5 (3) MG/3ML IN SOLN: 3.0000 mL | Freq: Four times a day (QID) | RESPIRATORY_TRACT | 2 refills | Status: DC | PRN

## 2020-11-02 NOTE — Patient Instructions (Addendum)
Check to see if you are currently  taking amlodipine  and WHAT DOSE YOU ARE TAKING    so I can adjust your dose.  The combivent medication is cheaper if you use it through a nebulizer machine  up to 4 times daily  PURSED LIP BREATHING  DNR ORDERS SIGNED TODAY  MOST ORDERS: TAKE WITH YOU TO HOSPITAL IF HOSPITALIZED  CAROTID DOPPLER ULTRASOUND HAS BEEN ORDERED

## 2020-11-02 NOTE — Progress Notes (Signed)
Subjective:  Patient ID: Terry [name], male    DOB: 1933/08/31  Age: 87 y.o. MRN: [id number]  CC: The primary encounter diagnosis was Diabetic nephropathy associated with type 2 diabetes mellitus ([place]). Diagnoses of DNR no code (do not resuscitate), Hyperthyroidism, Primary hypertension, Carotid stenosis, asymptomatic, bilateral, Counseling regarding end of life decision making, Need for immunization against influenza, Need for pneumococcal vaccination, COPD, moderate ([place]), and Type 2 DM with CKD stage 3 and hypertension ([place]) were also pertinent to this visit.  HPI Terry [name] presents for follow up on multiple issues  Chief Complaint  Patient presents with   Follow-up    Diabetes, hypertension, hyperthyroidism   This visit occurred during the SARS-CoV-2 public health emergency.  Safety protocols were in place, including screening questions prior to the visit, additional usage of staff PPE, and extensive cleaning of exam room while observing appropriate contact time as indicated for disinfecting solutions.    T2DM with CKD stage 3 /nephropathy  :  diabetes has been diet controlled.  Does not check BS.  Had his eye exam last week per patient. ([place]) ;   saw  Nephrology in April after 3 yrs of being lost to follow up . Amlodipine 2.5 mg started  for BP 165.  However he was already taking 5 mg prescribed by me and has confirmed that he is taking 5 mg daily   COPD ::  advanced, with chronic hypoxic resp failure;  refuses to wear oxygen . Not using combivent regularly.   3 weeks ago started having nocturnal dyspnea, now having exertional dyspnea.  Has become  sedentary since he quit his part time job at [employer]  due to dyspnea.   CODE status discussed ,  he desires to be DNR status.  MOST orders and DNR orders signed and given to patient    HTN:    Does not check BP at home. .    Depression screen:  he emphatically denies depression. "I've lived a good life.  I don't  want to end up like those people out there."      Outpatient Medications Prior to Visit  Medication Sig Dispense Refill   amLODipine (NORVASC) 5 MG tablet TAKE 1 TABLET BY MOUTH EVERY DAY 90 tablet 3   aspirin EC 81 MG tablet Take 1 tablet (81 mg total) by mouth daily. 90 tablet 3   b complex vitamins tablet Take 1 tablet by mouth daily.     cyanocobalamin 1000 MCG tablet Take 1,000 mcg by mouth daily.     metFORMIN (GLUCOPHAGE) 500 MG tablet TAKE 1 TABLET (500 MG TOTAL) BY MOUTH 2 (TWO) TIMES DAILY WITH A MEAL. 180 tablet 3   methimazole (TAPAZOLE) 5 MG tablet TAKE 1 TABLET BY MOUTH EVERY DAY 90 tablet 1   metoprolol succinate (TOPROL-XL) 50 MG 24 hr tablet TAKE 1 TABLET BY MOUTH EVERY DAY 90 tablet 1   omeprazole (PRILOSEC) 40 MG capsule TAKE 1 CAPSULE BY MOUTH EVERY DAY 90 capsule 1   sildenafil (REVATIO) 20 MG tablet Take 1 tablet (20 mg total) by mouth 3 (three) times daily. 50 tablet 5   simvastatin (ZOCOR) 40 MG tablet TAKE 1 TABLET BY MOUTH EVERYDAY AT BEDTIME 90 tablet 3   tamsulosin (FLOMAX) 0.4 MG CAPS capsule TAKE 1 CAPSULE (0.4 MG TOTAL) BY MOUTH DAILY. 90 capsule 1   telmisartan (MICARDIS) 80 MG tablet TAKE 1 TABLET BY MOUTH EVERYDAY AT BEDTIME 90 tablet 1   COMBIVENT RESPIMAT 20-100  MCG/ACT AERS respimat Use as directed 1 puff in the mouth or throat 4 (four) times daily as needed.  2   Facility-Administered Medications Prior to Visit  Medication Dose Route Frequency Provider Last Rate Last Admin   ipratropium-albuterol (DUONEB) 0.5-2.5 (3) MG/3ML nebulizer solution 3 mL  3 mL Nebulization Q6H Burnard Hawthorne, FNP        Review of Systems;  Patient denies headache, fevers, malaise, unintentional weight loss, skin rash, eye pain, sinus congestion and sinus pain, sore throat, dysphagia,  hemoptysis , cough, wheezing, chest pain, palpitations, orthopnea, edema, abdominal pain, nausea, melena, diarrhea, constipation, flank pain, dysuria, hematuria, urinary  Frequency,  nocturia, numbness, tingling, seizures,  Focal weakness, Loss of consciousness,  Tremor, insomnia, depression, anxiety, and suicidal ideation.      Objective:  BP (!) 158/68 (BP Location: Left Arm, Patient Position: Sitting, Cuff Size: Normal)   Pulse (!) 116   Temp (!) 96.2 F (35.7 C) (Temporal)   Ht 5' 11.5" (1.816 m)   Wt 171 lb (77.6 kg)   SpO2 92%   BMI 23.52 kg/m   BP Readings from Last 3 Encounters:  11/02/20 (!) 158/68  04/07/20 126/76  04/25/18 118/62    Wt Readings from Last 3 Encounters:  11/02/20 171 lb (77.6 kg)  04/07/20 167 lb 3.2 oz (75.8 kg)  03/23/20 165 lb (74.8 kg)    General appearance: alert, cooperative and appears stated age Ears: normal TM's and external ear canals both ears Throat: lips, mucosa, and tongue normal; teeth and gums normal Neck: no adenopathy, no carotid bruit, supple, symmetrical, trachea midline and thyroid not enlarged, symmetric, no tenderness/mass/nodules Back: symmetric, no curvature. ROM normal. No CVA tenderness. Lungs: clear to auscultation bilaterally Heart: regular rate and rhythm, S1, S2 normal, no murmur, click, rub or gallop Abdomen: soft, non-tender; bowel sounds normal; no masses,  no organomegaly Pulses: 2+ and symmetric Skin: Skin color, texture, turgor normal. No rashes or lesions Lymph nodes: Cervical, supraclavicular, and axillary nodes normal.  Lab Results  Component Value Date   HGBA1C 6.8 (H) 11/02/2020   HGBA1C 6.1 (A) 04/07/2020   HGBA1C 6.9 (H) 09/29/2018    Lab Results  Component Value Date   CREATININE 1.33 11/02/2020   CREATININE 1.49 04/07/2020   CREATININE 1.42 09/29/2018    Lab Results  Component Value Date   WBC 7.5 09/29/2018   HGB 12.0 (L) 09/29/2018   HCT 36.0 (L) 09/29/2018   PLT 313.0 09/29/2018   GLUCOSE 119 (H) 11/02/2020   CHOL 173 11/02/2020   TRIG 102.0 11/02/2020   HDL 77.60 11/02/2020   LDLDIRECT 49.0 03/12/2016   LDLCALC 75 11/02/2020   ALT 9 11/02/2020   AST 16  11/02/2020   NA 137 11/02/2020   K 4.3 11/02/2020   CL 100 11/02/2020   CREATININE 1.33 11/02/2020   BUN 14 11/02/2020   CO2 28 11/02/2020   TSH 0.89 11/02/2020   PSA 0.75 11/12/2013   INR 0.9 12/12/2012   HGBA1C 6.8 (H) 11/02/2020   MICROALBUR 60.3 (H) 04/07/2020      Assessment & Plan:   Problem List Items Addressed This Visit       Unprioritized   COPD, moderate (HCC)    Pursed lip breathing technique demonstrated with good improvement in symptoms.  He has declined continued oxygen supplementation and is aware his prognosis .  DNR and MOST orders signed.       Relevant Medications   albuterol (VENTOLIN HFA) 108 (90 Base) MCG/ACT inhaler  ipratropium-albuterol (DUONEB) 0.5-2.5 (3) MG/3ML SOLN   Diabetic nephropathy associated with type 2 diabetes mellitus ([place]) - Primary   Relevant Orders   Hemoglobin A1c (Completed)   Lipid panel (Completed)   DNR no code (do not resuscitate)    He was screened for depression .  DNR and MOST orders signed       Relevant Orders   DNR (Do Not Resuscitate)   Hypertension    Taking 5 mg amlodipine currently.  Will increase to 10 mg daily       Relevant Orders   Comprehensive metabolic panel (Completed)   Hyperthyroidism   Relevant Orders   TSH (Completed)   Type 2 DM with CKD stage 3 and hypertension (HCC)    A1c has risen to 6.8.  Would avoid metformin given his chronic respiratory failure and probable underlying compensated respiratory acidosis . If SGLT2 inhibitor is affordable will prescribe.  GFR is > 16ml/min.  Will increase amlodipine to 10 mg daily given persistent hypertension (noted at nephrology visit as well )  Lab Results  Component Value Date   HGBA1C 6.8 (H) 11/02/2020         Other Visit Diagnoses     Carotid stenosis, asymptomatic, bilateral       Relevant Orders   VAS US CAROTID   Counseling regarding end of life decision making       Relevant Orders   DNR (Do Not Resuscitate)   Need for  immunization against influenza       Relevant Orders   Flu Vaccine QUAD High Dose(Fluad) (Completed)   Need for pneumococcal vaccination       Relevant Orders   Pneumococcal conjugate vaccine 20-valent (Prevnar 20) (Completed)       I have discontinued Kaeleb D. Torelli "FRANK"'s Combivent Respimat. I am also having him start on albuterol and ipratropium-albuterol. Additionally, I am having him maintain his cyanocobalamin, b complex vitamins, aspirin EC, sildenafil, tamsulosin, omeprazole, simvastatin, amLODipine, methimazole, metoprolol succinate, metFORMIN, and telmisartan. We will continue to administer ipratropium-albuterol.  Meds ordered this encounter  Medications   albuterol (VENTOLIN HFA) 108 (90 Base) MCG/ACT inhaler    Sig: Inhale 2 puffs into the lungs every 6 (six) hours as needed for wheezing or shortness of breath.    Dispense:  3.7 g    Refill:  11   ipratropium-albuterol (DUONEB) 0.5-2.5 (3) MG/3ML SOLN    Sig: Take 3 mLs by nebulization every 6 (six) hours as needed.    Dispense:  360 mL    Refill:  2    Medications Discontinued During This Encounter  Medication Reason   COMBIVENT RESPIMAT 20-100 MCG/ACT AERS respimat    A total of 40 minutes was spent with patient more than half of which was spent in counseling patient on the above mentioned issues discussing end of life goals,  and coordination of care.   Follow-up: Return in about 6 months (around 05/03/2021).   [name], MD

## 2020-11-03 LAB — LIPID PANEL
Cholesterol: 173 mg/dL (ref 0–200)
HDL: 77.6 mg/dL (ref >39.00)
LDL Cholesterol: 75 mg/dL (ref 0–99)
NonHDL: 95.58
Total CHOL/HDL Ratio: 2
Triglycerides: 102 mg/dL (ref 0.0–149.0)
VLDL: 20.4 mg/dL (ref 0.0–40.0)

## 2020-11-03 LAB — COMPREHENSIVE METABOLIC PANEL
ALT: 9 U/L (ref 0–53)
AST: 16 U/L (ref 0–37)
Albumin: 3.9 g/dL (ref 3.5–5.2)
Alkaline Phosphatase: 81 U/L (ref 39–117)
BUN: 14 mg/dL (ref 6–23)
CO2: 28 mEq/L (ref 19–32)
Calcium: 9.4 mg/dL (ref 8.4–10.5)
Chloride: 100 mEq/L (ref 96–112)
Creatinine, Ser: 1.33 mg/dL (ref 0.40–1.50)
GFR: 48.66 mL/min — ABNORMAL LOW (ref >60.00)
Glucose, Bld: 119 mg/dL — ABNORMAL HIGH (ref 70–99)
Potassium: 4.3 mEq/L (ref 3.5–5.1)
Sodium: 137 mEq/L (ref 135–145)
Total Bilirubin: 0.7 mg/dL (ref 0.2–1.2)
Total Protein: 6.6 g/dL (ref 6.0–8.3)

## 2020-11-03 LAB — HEMOGLOBIN A1C: Hgb A1c MFr Bld: 6.8 % — ABNORMAL HIGH (ref 4.6–6.5)

## 2020-11-03 LAB — TSH: TSH: 0.89 u[IU]/mL (ref 0.35–5.50)

## 2020-11-03 MED ORDER — EMPAGLIFLOZIN 10 MG PO TABS: 10.0000 mg | ORAL_TABLET | Freq: Every day | ORAL | 0 refills | Status: DC

## 2020-11-03 MED ORDER — AMLODIPINE BESYLATE 10 MG PO TABS: 10.0000 mg | ORAL_TABLET | Freq: Every day | ORAL | 1 refills | Status: DC

## 2020-11-03 NOTE — Assessment & Plan Note (Signed)
Pursed lip breathing technique demonstrated with good improvement in symptoms.  He has declined continued oxygen supplementation and is aware his prognosis .  DNR and MOST orders signed.  

## 2020-11-03 NOTE — Assessment & Plan Note (Signed)
Taking 5 mg amlodipine currently.  Will increase to 10 mg daily

## 2020-11-03 NOTE — Assessment & Plan Note (Addendum)
A1c has risen to 6.8.  Would avoid metformin given his chronic respiratory failure and probable underlying compensated respiratory acidosis . If SGLT2 inhibitor is affordable will prescribe.  GFR is > 33ml/min.  Will increase amlodipine to 10 mg daily given persistent hypertension (noted at nephrology visit as well )  Lab Results  Component Value Date   HGBA1C 6.8 (H) 11/02/2020

## 2020-11-03 NOTE — Assessment & Plan Note (Signed)
He was screened for depression .  DNR and MOST orders signed

## 2020-11-10 ENCOUNTER — Telehealth: Payer: Self-pay

## 2020-11-10 NOTE — Telephone Encounter (Signed)
PA for Duoneb has been submitted on covermymeds.

## 2020-11-18 ENCOUNTER — Telehealth: Payer: Self-pay | Admitting: Internal Medicine

## 2020-11-18 NOTE — Telephone Encounter (Signed)
Pharmacy is stating a PA is still in process for patient. A duplicate request was denied. Patient has been informed of the above.

## 2020-11-18 NOTE — Telephone Encounter (Signed)
Patient called in stating his refill for a nebulizer was denied but he is unsure why.Please call him at [phone].

## 2020-12-03 DIAGNOSIS — J449 Chronic obstructive pulmonary disease, unspecified: Secondary | ICD-10-CM | POA: Diagnosis not present

## 2020-12-04 ENCOUNTER — Other Ambulatory Visit: Payer: Self-pay | Admitting: Internal Medicine

## 2020-12-15 ENCOUNTER — Telehealth: Payer: Self-pay | Admitting: Internal Medicine

## 2020-12-15 NOTE — Telephone Encounter (Signed)
Patient dropped off a Denial of medicare part D letter. It looks like medication has been approved through his Medicare Part A/B. [facility] states that [facility] will not fill . Paper is up front in [place]'s color folder.

## 2020-12-16 NOTE — Telephone Encounter (Signed)
Spoke with pt to let him know I have resent the prescription to [employer] which is the company that his nebulizer machine came from because that is the insurance that the medication is covered under. Pt gave a verbal understanding.

## 2020-12-27 ENCOUNTER — Other Ambulatory Visit: Payer: Self-pay | Admitting: Internal Medicine

## 2021-01-02 DIAGNOSIS — J449 Chronic obstructive pulmonary disease, unspecified: Secondary | ICD-10-CM | POA: Diagnosis not present

## 2021-01-24 ENCOUNTER — Other Ambulatory Visit: Payer: Self-pay | Admitting: Internal Medicine

## 2021-02-02 DIAGNOSIS — J449 Chronic obstructive pulmonary disease, unspecified: Secondary | ICD-10-CM | POA: Diagnosis not present

## 2021-02-23 ENCOUNTER — Other Ambulatory Visit: Payer: Self-pay

## 2021-02-23 ENCOUNTER — Ambulatory Visit (INDEPENDENT_AMBULATORY_CARE_PROVIDER_SITE_OTHER): Payer: PPO

## 2021-02-23 ENCOUNTER — Other Ambulatory Visit: Payer: Self-pay | Admitting: Internal Medicine

## 2021-02-23 DIAGNOSIS — I6523 Occlusion and stenosis of bilateral carotid arteries: Secondary | ICD-10-CM

## 2021-02-23 DIAGNOSIS — N183 Chronic kidney disease, stage 3 unspecified: Secondary | ICD-10-CM

## 2021-02-23 DIAGNOSIS — E1122 Type 2 diabetes mellitus with diabetic chronic kidney disease: Secondary | ICD-10-CM

## 2021-02-23 DIAGNOSIS — E785 Hyperlipidemia, unspecified: Secondary | ICD-10-CM

## 2021-02-23 MED ORDER — ATORVASTATIN CALCIUM 20 MG PO TABS: 20.0000 mg | ORAL_TABLET | Freq: Every day | ORAL | 3 refills | Status: DC

## 2021-02-23 NOTE — Assessment & Plan Note (Signed)
<  50%.  Change statin to atorvastatin

## 2021-02-24 ENCOUNTER — Telehealth: Payer: Self-pay

## 2021-02-24 NOTE — Telephone Encounter (Signed)
LMTCB in regards to lab results.  

## 2021-02-25 NOTE — Assessment & Plan Note (Signed)
Changing simvastatin to atorvastatin 20 mg;  Goal LDL < 70

## 2021-02-25 NOTE — Assessment & Plan Note (Signed)
noncritical by repeat dopplers Jan 2023.  Continue LDL reduction , increase ATORVASTATIN TO 40 mg for goal LDL < 70

## 2021-03-05 DIAGNOSIS — J449 Chronic obstructive pulmonary disease, unspecified: Secondary | ICD-10-CM | POA: Diagnosis not present

## 2021-03-08 ENCOUNTER — Telehealth: Payer: Self-pay

## 2021-03-08 MED ORDER — SILDENAFIL CITRATE 20 MG PO TABS: 20.0000 mg | ORAL_TABLET | Freq: Three times a day (TID) | ORAL | 5 refills | Status: DC

## 2021-03-08 MED ORDER — IPRATROPIUM-ALBUTEROL 0.5-2.5 (3) MG/3ML IN SOLN: 3.0000 mL | Freq: Four times a day (QID) | RESPIRATORY_TRACT | 2 refills | Status: DC | PRN

## 2021-03-08 NOTE — Telephone Encounter (Signed)
When speaking to pt about results he stated that he never got the medications for his nebulizer machine. Advised pt that I would probably have to send that in to his regular pharmacy. Pt also stated that he needs a refill on his sildenafil. Medications have been refilled.

## 2021-03-14 NOTE — Telephone Encounter (Signed)
Pt returning call. Pt requesting callback.  °

## 2021-03-15 ENCOUNTER — Other Ambulatory Visit: Payer: Self-pay | Admitting: Internal Medicine

## 2021-03-16 NOTE — Telephone Encounter (Signed)
Spoke with pt to let him know that both medications have been sent in to pharmacy. [facility] stated that [facility] not [facility]. [facility] and they stated that they had to order it and would call him when it came in. Advised pt of this and he gave a verbal understanding.

## 2021-03-24 ENCOUNTER — Ambulatory Visit: Payer: PPO

## 2021-03-24 ENCOUNTER — Telehealth: Payer: Self-pay

## 2021-03-24 NOTE — Telephone Encounter (Signed)
No answer when called for scheduled AWV. Appointment confirmed yesterday. Unable to leave message. Reschedule.

## 2021-04-02 DIAGNOSIS — J449 Chronic obstructive pulmonary disease, unspecified: Secondary | ICD-10-CM | POA: Diagnosis not present

## 2021-04-21 DIAGNOSIS — E119 Type 2 diabetes mellitus without complications: Secondary | ICD-10-CM | POA: Diagnosis not present

## 2021-05-02 ENCOUNTER — Other Ambulatory Visit: Payer: Self-pay | Admitting: Internal Medicine

## 2021-05-03 DIAGNOSIS — J449 Chronic obstructive pulmonary disease, unspecified: Secondary | ICD-10-CM | POA: Diagnosis not present

## 2021-05-19 ENCOUNTER — Telehealth: Payer: Self-pay | Admitting: Internal Medicine

## 2021-05-19 NOTE — Telephone Encounter (Signed)
Copied from [place] [phone]. Topic: Medicare AWV ?>> May 19, 2021 11:22 AM Harris-Coley, Hannah Beat wrote: ?Reason for CRM: Left message for patient to schedule Annual Wellness Visit.  Please schedule with Nurse Health Advisor Denisa O'Brien-Blaney, LPN at [facility].  Please call [phone] ask for Juliann Pulse ?

## 2021-06-02 DIAGNOSIS — J449 Chronic obstructive pulmonary disease, unspecified: Secondary | ICD-10-CM | POA: Diagnosis not present

## 2021-06-24 ENCOUNTER — Other Ambulatory Visit: Payer: Self-pay | Admitting: Internal Medicine

## 2021-07-06 DIAGNOSIS — D2262 Melanocytic nevi of left upper limb, including shoulder: Secondary | ICD-10-CM | POA: Diagnosis not present

## 2021-07-06 DIAGNOSIS — X32XXXA Exposure to sunlight, initial encounter: Secondary | ICD-10-CM | POA: Diagnosis not present

## 2021-07-06 DIAGNOSIS — L570 Actinic keratosis: Secondary | ICD-10-CM | POA: Diagnosis not present

## 2021-07-06 DIAGNOSIS — D2272 Melanocytic nevi of left lower limb, including hip: Secondary | ICD-10-CM | POA: Diagnosis not present

## 2021-07-06 DIAGNOSIS — D2261 Melanocytic nevi of right upper limb, including shoulder: Secondary | ICD-10-CM | POA: Diagnosis not present

## 2021-07-06 DIAGNOSIS — Z85828 Personal history of other malignant neoplasm of skin: Secondary | ICD-10-CM | POA: Diagnosis not present

## 2021-07-06 DIAGNOSIS — L821 Other seborrheic keratosis: Secondary | ICD-10-CM | POA: Diagnosis not present

## 2021-07-07 ENCOUNTER — Other Ambulatory Visit: Payer: Self-pay | Admitting: Internal Medicine

## 2021-07-11 ENCOUNTER — Ambulatory Visit (INDEPENDENT_AMBULATORY_CARE_PROVIDER_SITE_OTHER): Payer: PPO

## 2021-07-11 VITALS — Ht 71.5 in | Wt 168.0 lb

## 2021-07-11 DIAGNOSIS — Z0000 Encounter for general adult medical examination without abnormal findings: Secondary | ICD-10-CM

## 2021-07-11 NOTE — Progress Notes (Cosign Needed Addendum)
Subjective:   Terry [name] is a 88 y.o. male who presents for Medicare Annual/Subsequent preventive examination.  Review of Systems    No ROS.  Medicare Wellness Virtual Visit.  Visual/audio telehealth visit, UTA vital signs.   See social history for additional risk factors.   Cardiac Risk Factors include: advanced age (>73mn, >>57women);diabetes mellitus;male gender;hypertension     Objective:    Today's Vitals   07/11/21 1353  Weight: 168 lb (76.2 kg)  Height: 5' 11.5" (1.816 m)   Body mass index is 23.1 kg/m.     07/11/2021    2:00 PM 03/23/2020    9:14 AM 03/23/2019    9:15 AM 03/18/2018   12:18 PM 11/06/2016    3:22 PM 09/19/2016    7:56 AM  Advanced Directives  Does Patient Have a Medical Advance Directive? Yes Yes Yes Yes Yes No  Type of Advance Directive Out of facility DNR (pink MOST or yellow form)   Living will [place] will   Does patient want to make changes to medical advance directive?  No - Patient declined No - Patient declined No - Patient declined No - Patient declined   Copy of [place] Chart?     No - copy requested   Would patient like information on creating a medical advance directive?      No - Patient declined    Current Medications (verified) Outpatient Encounter Medications as of 07/11/2021  Medication Sig   albuterol (VENTOLIN HFA) 108 (90 Base) MCG/ACT inhaler Inhale 2 puffs into the lungs every 6 (six) hours as needed for wheezing or shortness of breath.   amLODipine (NORVASC) 10 MG tablet TAKE 1 TABLET BY MOUTH EVERY DAY   aspirin EC 81 MG tablet Take 1 tablet (81 mg total) by mouth daily.   atorvastatin (LIPITOR) 20 MG tablet Take 1 tablet (20 mg total) by mouth daily.   b complex vitamins tablet Take 1 tablet by mouth daily.   cyanocobalamin 1000 MCG tablet Take 1,000 mcg by mouth daily.   ipratropium-albuterol (DUONEB) 0.5-2.5 (3) MG/3ML SOLN Take 3 mLs by nebulization every 6 (six)  hours as needed.   JARDIANCE 10 MG TABS tablet TAKE 1 TABLET BY MOUTH DAILY BEFORE BREAKFAST.   metFORMIN (GLUCOPHAGE) 500 MG tablet TAKE 1 TABLET (500 MG TOTAL) BY MOUTH 2 (TWO) TIMES DAILY WITH A MEAL.   methimazole (TAPAZOLE) 5 MG tablet TAKE 1 TABLET BY MOUTH EVERY DAY   metoprolol succinate (TOPROL-XL) 50 MG 24 hr tablet TAKE 1 TABLET BY MOUTH EVERY DAY   omeprazole (PRILOSEC) 40 MG capsule TAKE 1 CAPSULE BY MOUTH EVERY DAY   sildenafil (REVATIO) 20 MG tablet Take 1 tablet (20 mg total) by mouth 3 (three) times daily.   tamsulosin (FLOMAX) 0.4 MG CAPS capsule TAKE 1 CAPSULE (0.4 MG TOTAL) BY MOUTH DAILY.   telmisartan (MICARDIS) 80 MG tablet TAKE 1 TABLET BY MOUTH EVERYDAY AT BEDTIME   Facility-Administered Encounter Medications as of 07/11/2021  Medication   ipratropium-albuterol (DUONEB) 0.5-2.5 (3) MG/3ML nebulizer solution 3 mL    Allergies (verified) Patient has no known allergies.   History: Past Medical History:  Diagnosis Date   Anemia    COPD (chronic obstructive pulmonary disease) ([place]    Diabetes mellitus 2008   GERD (gastroesophageal reflux disease)    Hyperlipidemia    Hypertension    hyperthyroidism    Squamous cell carcinoma of skin of left upper arm November 2015  Excised to negative margins, associated keratoacanthoma.   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 Dionne Milo. He is due for followup colonoscopy    Past Surgical History:  Procedure Laterality Date   [place] / EXCISION Left 2016   CATARACT EXTRACTION, BILATERAL  2012   [place]   COLONOSCOPY  12-10-12   COLONOSCOPY W/ POLYPECTOMY  2011   Dr. Raina Mina   COLONOSCOPY WITH PROPOFOL N/A 09/19/2016   Procedure: COLONOSCOPY WITH PROPOFOL;  Surgeon: Robert Bellow, MD;  Location: [facility] ENDOSCOPY;  Service: Endoscopy;  Laterality: N/A;   ESOPHAGOGASTRODUODENOSCOPY (EGD) WITH PROPOFOL N/A 09/19/2016   Procedure:  ESOPHAGOGASTRODUODENOSCOPY (EGD) WITH PROPOFOL;  Surgeon: Robert Bellow, MD;  Location: [facility] ENDOSCOPY;  Service: Endoscopy;  Laterality: N/A;   Family History  Problem Relation Age of Onset   Hypertension Father    Diabetes Maternal Aunt    Hyperlipidemia Maternal Uncle    Birth defects Neg Hx    Social History   Socioeconomic History   Marital status: Married    Spouse name: Not on file   Number of children: Not on file   Years of education: Not on file   Highest education level: Not on file  Occupational History   Not on file  Tobacco Use   Smoking status: Former    Packs/day: 2.00    Years: 50.00    Total pack years: 100.00    Types: Cigarettes    Quit date: 06/26/1999    Years since quitting: 22.0   Smokeless tobacco: Never  Vaping Use   Vaping Use: Never used  Substance and Sexual Activity   Alcohol use: Not Currently   Drug use: No   Sexual activity: Yes  Other Topics Concern   Not on file  Social History Narrative   Not on file   Social Determinants of Health   Financial Resource Strain: Low Risk  (07/11/2021)   Overall Financial Resource Strain (CARDIA)    Difficulty of Paying Living Expenses: Not hard at all  Food Insecurity: No Food Insecurity (07/11/2021)   Hunger Vital Sign    Worried About Running Out of Food in the Last Year: Never true    [place] in the Last Year: Never true  Transportation Needs: No Transportation Needs (07/11/2021)   PRAPARE - Hydrologist (Medical): No    Lack of Transportation (Non-Medical): No  Physical Activity: Insufficiently Active (03/23/2020)   Exercise Vital Sign    Days of Exercise per Week: 5 days    Minutes of Exercise per Session: 20 min  Stress: No Stress Concern Present (07/11/2021)   [place]    Feeling of Stress : Not at all  Social Connections: Unknown (07/11/2021)   Social Connection and Isolation Panel  [NHANES]    Frequency of Communication with Friends and Family: Not on file    Frequency of Social Gatherings with Friends and Family: Not on file    Attends Religious Services: Not on file    Active Member of Clubs or Organizations: Not on file    Attends Archivist Meetings: Not on file    Marital Status: Married    Tobacco Counseling Counseling given: Not Answered   Clinical Intake:  Pre-visit preparation completed: Yes        Diabetes: Yes (Followed by PCP)  How often do you need to  have someone help you when you read instructions, pamphlets, or other written materials from your doctor or pharmacy?: 1 - Never  Interpreter Needed?: No    Activities of Daily Living    07/11/2021    2:03 PM  In your present state of health, do you have any difficulty performing the following activities:  Hearing? 0  Vision? 0  Difficulty concentrating or making decisions? 0  Walking or climbing stairs? 0  Dressing or bathing? 0  Doing errands, shopping? 0  Preparing Food and eating ? N  Using the Toilet? N  In the past six months, have you accidently leaked urine? N  Do you have problems with loss of bowel control? N  Managing your Medications? N  Managing your Finances? N  Housekeeping or managing your Housekeeping? N    Patient Care Team: [name], MD as PCP - General (Internal Medicine) Bary Castilla Forest Gleason, MD (General Surgery) [name], MD (Internal Medicine) Wellington Hampshire, MD as Consulting Physician (Cardiology)  Indicate any recent Medical Services you may have received from other than [facility] providers in the past year (date may be approximate).     Assessment:   This is a routine wellness examination for Terry [name].  Virtual Visit via Telephone Note  I connected with  Terry [name] on 07/11/21 at  1:45 PM EDT by telephone and verified that I am speaking with the correct person using two identifiers.  Persons participating in the virtual  visit: patient/Nurse Health Advisor   I discussed the limitations of performing an evaluation and management service by telehealth. We continued and completed visit with audio only. Some vital signs may be absent or patient reported.   Hearing/Vision screen Hearing Screening - Comments:: Patient is able to hear conversational tones without difficulty.  No issues reported. Vision Screening - Comments:: Wears corrective lenses Cataract extraction, bilateral No retinopathy They have seen their ophthalmologist in the last 12 months.    Dietary issues and exercise activities discussed: Current Exercise Habits: Home exercise routine, Intensity: Mild Healthy diet Good water intake   Goals Addressed               This Visit's Progress     Patient Stated     Maintain weight (pt-stated)        Healthy diet Stay active       Depression Screen    07/11/2021    1:57 PM 11/02/2020    1:41 PM 03/23/2020    9:13 AM 03/23/2019    9:23 AM 03/18/2018   12:21 PM 11/06/2016    3:39 PM 07/18/2016    2:10 PM  PHQ 2/9 Scores  PHQ - 2 Score 0 0 0 0 0 0 0    Fall Risk    07/11/2021    2:03 PM 11/02/2020    1:40 PM 04/07/2020    8:17 AM 03/23/2020    9:15 AM 04/08/2019   11:28 AM  Fall Risk   Falls in the past year? 0 0 0 0 0  Number falls in past yr:    0   Injury with Fall?    0   Follow up Falls evaluation completed Falls evaluation completed Falls evaluation completed Falls evaluation completed Falls evaluation completed    [place]: Home free of loose throw rugs in walkways, pet beds, electrical cords, etc? Yes  Adequate lighting in your home to reduce risk of falls? Yes   ASSISTIVE DEVICES UTILIZED  TO PREVENT FALLS: Life alert? No  Use of a cane, walker or w/c? No   TIMED UP AND GO: Was the test performed? No .   Cognitive Function: Patient is alert and oriented x3.     11/06/2016    3:36 PM  MMSE - Mini Mental State Exam  Orientation to  time 5  Orientation to Place 5  Registration 3  Attention/ Calculation 5  Recall 3  Language- name 2 objects 2  Language- repeat 1  Language- follow 3 step command 3  Language- read & follow direction 1  Write a sentence 1  Copy design 1  Total score 30        03/23/2019    9:27 AM 03/18/2018   12:28 PM  6CIT Screen  What Year? 0 points 0 points  What month? 0 points 0 points  What time? 0 points 0 points  Count back from 20  0 points  Months in reverse 0 points 0 points  Repeat phrase  0 points  Total Score  0 points    Immunizations Immunization History  Administered Date(s) Administered   Fluad Quad(high Dose 65+) 12/23/2018, 12/17/2019, 11/02/2020   Influenza, High Dose Seasonal PF 01/17/2015, 11/06/2016, 10/08/2017   Influenza,inj,Quad PF,6+ Mos 10/02/2012, 11/12/2013   Influenza-Unspecified 10/30/2015   PFIZER(Purple Top)SARS-COV-2 Vaccination 03/27/2019, 04/22/2019, 11/30/2019   PNEUMOCOCCAL CONJUGATE-20 11/02/2020   Pneumococcal Conjugate-13 11/12/2013   Pneumococcal Polysaccharide-23 10/02/2012   Tdap 02/25/2013   Screening Tests Health Maintenance  Topic Date Due   HEMOGLOBIN A1C  05/03/2021   COVID-19 Vaccine (4 - Pfizer series) 07/27/2021 (Originally 01/25/2020)   INFLUENZA VACCINE  08/29/2021   OPHTHALMOLOGY EXAM  10/24/2021   FOOT EXAM  11/02/2021   TETANUS/TDAP  02/26/2023   Pneumonia Vaccine 54+ Years old  Completed   HPV VACCINES  Aged Out   Zoster Vaccines- Shingrix  Discontinued   Health Maintenance Health Maintenance Due  Topic Date Due   HEMOGLOBIN A1C  05/03/2021   Hepatitis C Screening: does not qualify.  Vision Screening: Recommended annual ophthalmology exams for early detection of glaucoma and other disorders of the eye.  Dental Screening: Recommended annual dental exams for proper oral hygiene  Community Resource Referral / Chronic Care Management: CRR required this visit?  No   CCM required this visit?  No      Plan:    Keep all routine maintenance appointments.   I have personally reviewed and noted the following in the patient's chart:   Medical and social history Use of alcohol, tobacco or illicit drugs  Current medications and supplements including opioid prescriptions. Patient is not currently taking opioid prescriptions. Functional ability and status Nutritional status Physical activity Advanced directives List of other physicians Hospitalizations, surgeries, and ER visits in previous 12 months Vitals Screenings to include cognitive, depression, and falls Referrals and appointments  In addition, I have reviewed and discussed with patient certain preventive protocols, quality metrics, and best practice recommendations. A written personalized care plan for preventive services as well as general preventive health recommendations were provided to patient.     OBrien-Blaney, Greenly Rarick L, LPN   05/14/8307    I have reviewed the above information and agree with above.   Deborra Medina, MD

## 2021-07-11 NOTE — Patient Instructions (Addendum)
  Terry [name] , Thank you for taking time to come for your Medicare Wellness Visit. I appreciate your ongoing commitment to your health goals. Please review the following plan we discussed and let me know if I can assist you in the future.   These are the goals we discussed:  Goals       Patient Stated     Maintain weight (pt-stated)      Healthy diet Stay active        This is a list of the screening recommended for you and due dates:  Health Maintenance  Topic Date Due   Hemoglobin A1C  05/03/2021   COVID-19 Vaccine (4 - Pfizer series) 07/27/2021*   Flu Shot  08/29/2021   Eye exam for diabetics  10/24/2021   Complete foot exam   11/02/2021   Tetanus Vaccine  02/26/2023   Pneumonia Vaccine  Completed   HPV Vaccine  Aged Out   Zoster (Shingles) Vaccine  Discontinued  *Topic was postponed. The date shown is not the original due date.

## 2021-08-09 ENCOUNTER — Ambulatory Visit (INDEPENDENT_AMBULATORY_CARE_PROVIDER_SITE_OTHER): Payer: PPO | Admitting: Internal Medicine

## 2021-08-09 ENCOUNTER — Encounter: Payer: Self-pay | Admitting: Internal Medicine

## 2021-08-09 VITALS — BP 162/78 | HR 94 | Temp 97.9°F | Ht 71.5 in | Wt 159.2 lb

## 2021-08-09 DIAGNOSIS — J449 Chronic obstructive pulmonary disease, unspecified: Secondary | ICD-10-CM

## 2021-08-09 DIAGNOSIS — I2584 Coronary atherosclerosis due to calcified coronary lesion: Secondary | ICD-10-CM

## 2021-08-09 DIAGNOSIS — E1121 Type 2 diabetes mellitus with diabetic nephropathy: Secondary | ICD-10-CM | POA: Diagnosis not present

## 2021-08-09 DIAGNOSIS — E785 Hyperlipidemia, unspecified: Secondary | ICD-10-CM

## 2021-08-09 DIAGNOSIS — N183 Chronic kidney disease, stage 3 unspecified: Secondary | ICD-10-CM | POA: Diagnosis not present

## 2021-08-09 DIAGNOSIS — I129 Hypertensive chronic kidney disease with stage 1 through stage 4 chronic kidney disease, or unspecified chronic kidney disease: Secondary | ICD-10-CM | POA: Diagnosis not present

## 2021-08-09 DIAGNOSIS — E1122 Type 2 diabetes mellitus with diabetic chronic kidney disease: Secondary | ICD-10-CM | POA: Diagnosis not present

## 2021-08-09 DIAGNOSIS — I2510 Atherosclerotic heart disease of native coronary artery without angina pectoris: Secondary | ICD-10-CM | POA: Diagnosis not present

## 2021-08-09 DIAGNOSIS — E0590 Thyrotoxicosis, unspecified without thyrotoxic crisis or storm: Secondary | ICD-10-CM

## 2021-08-09 DIAGNOSIS — R5383 Other fatigue: Secondary | ICD-10-CM | POA: Diagnosis not present

## 2021-08-09 MED ORDER — TELMISARTAN 80 MG PO TABS: ORAL_TABLET | ORAL | 1 refills | Status: DC

## 2021-08-09 MED ORDER — ZOSTER VAC RECOMB ADJUVANTED 50 MCG/0.5ML IM SUSR: 0.5000 mL | Freq: Once | INTRAMUSCULAR | 1 refills | Status: DC

## 2021-08-09 MED ORDER — AMLODIPINE BESYLATE 10 MG PO TABS
10.0000 mg | ORAL_TABLET | Freq: Every day | ORAL | 1 refills | Status: DC
Start: 2021-08-09 — End: 2022-08-23

## 2021-08-09 MED ORDER — ZOSTER VAC RECOMB ADJUVANTED 50 MCG/0.5ML IM SUSR: 0.5000 mL | Freq: Once | INTRAMUSCULAR | 1 refills | Status: AC

## 2021-08-09 NOTE — Patient Instructions (Signed)
  You might want to try a premixed protein drink called Premier Protein shake in the morning.  It is less $$$ and very low sugar.    160 cal  30 g protein  1 g sugar 50% calcium needs    Practice Pursed Lip Breathing to help your breathing issues    The ShingRx vaccine is now available in local pharmacies and is  FREE FOR MEDICARE RECIPIENTS.  Two doses are needed (second dose due 2 to 6 months after first dose).  Anticipate a day or two of flu like symptoms

## 2021-08-09 NOTE — Progress Notes (Unsigned)
Patient ID: Terry [name], male    DOB: [birth year]  Age: 88 y.o. MRN: [id number]  The patient is here for follow up and management of other chronic and acute problems.   The risk factors are reflected in the social history.  The roster of all physicians providing medical care to patient - is listed in the Snapshot section of the chart.  Activities of daily living:  The patient is 100% independent in all ADLs: dressing, toileting, feeding as well as independent mobility  Home safety : The patient has smoke detectors in the home. They wear seatbelts.  There are no firearms at home. There is no violence in the home.   There is no risks for hepatitis, STDs or HIV. There is no   history of blood transfusion. They have no travel history to infectious disease endemic areas of the world.  The patient has seen their dentist in the last six month. They have seen their eye doctor in the last year. They admit to slight hearing difficulty with regard to whispered voices and some television programs.  They have deferred audiologic testing in the last year.  They do not  have excessive sun exposure. Discussed the need for sun protection: hats, long sleeves and use of sunscreen if there is significant sun exposure.   Diet: the importance of a healthy diet is discussed. He has lost 10 lbs unintentionally since June.  Eats twice daily ,  bagel w Terry [name] ,  skips lunch ,  has dinner , usually take out.    The benefits of regular aerobic exercise were discussed. He is limited by his severe COPD to just walking the dog .   Depression screen: there are no signs or vegative symptoms of depression- irritability, change in appetite, anhedonia, sadness/tearfullness.  Cognitive assessment: the patient manages all their financial and personal affairs and is actively engaged. They could relate day,date,year and events; recalled 2/3 objects at 3 minutes; performed clock-face test normally.  The following portions of the  patient's history were reviewed and updated as appropriate: allergies, current medications, past family history, past medical history,  past surgical history, past social history  and problem list.  Visual acuity was not assessed per patient preference since she has regular follow up with her ophthalmologist. Hearing and body mass index were assessed and reviewed.   During the course of the visit the patient was educated and counseled about appropriate screening and preventive services including : fall prevention , diabetes screening, nutrition counseling, colorectal cancer screening, and recommended immunizations.    CC: The primary encounter diagnosis was Type 2 DM with CKD stage 3 and hypertension ([place]). Diagnoses of Hyperlipidemia with target LDL less than 70, Hyperthyroidism, Other fatigue, COPD, moderate ([place]), Coronary atherosclerosis due to calcified coronary lesion of native artery, and Diabetic nephropathy associated with type 2 diabetes mellitus ([place]) were also pertinent to this visit.  1) Hypoxia:  secondaary to COPD ,  "not ready for oxygen."  2) no issues   History Terry [name] has a past medical history of Anemia, COPD (chronic obstructive pulmonary disease) ([place]), Diabetes mellitus (2008), GERD (gastroesophageal reflux disease), Hyperlipidemia, Hypertension, hyperthyroidism, Squamous cell carcinoma of skin of left upper arm (November 2015), and Tubulovillous adenoma of colon (06/28/2011).   He has a past surgical history that includes Cataract extraction, bilateral (2012); Colonoscopy w/ polypectomy (2011); Appendectomy (1970); Colonoscopy (12-10-12); Arm skin lesion biopsy / excision (Left, 2016); Colonoscopy with propofol (N/A, 09/19/2016); and Esophagogastroduodenoscopy (egd) with propofol (N/A, 09/19/2016).  His family history includes Diabetes in his maternal aunt; Hyperlipidemia in his maternal uncle; Hypertension in his father.He reports that he quit smoking about 22 years ago. His  smoking use included cigarettes. He has a 100.00 pack-year smoking history. He has never used smokeless tobacco. He reports that he does not currently use alcohol. He reports that he does not use drugs.  Outpatient Medications Prior to Visit  Medication Sig Dispense Refill   albuterol (VENTOLIN HFA) 108 (90 Base) MCG/ACT inhaler Inhale 2 puffs into the lungs every 6 (six) hours as needed for wheezing or shortness of breath. 3.7 g 11   aspirin EC 81 MG tablet Take 1 tablet (81 mg total) by mouth daily. 90 tablet 3   b complex vitamins tablet Take 1 tablet by mouth daily.     cyanocobalamin 1000 MCG tablet Take 1,000 mcg by mouth daily.     ipratropium-albuterol (DUONEB) 0.5-2.5 (3) MG/3ML SOLN Take 3 mLs by nebulization every 6 (six) hours as needed. 360 mL 2   methimazole (TAPAZOLE) 5 MG tablet TAKE 1 TABLET BY MOUTH EVERY DAY 90 tablet 1   metoprolol succinate (TOPROL-XL) 50 MG 24 hr tablet TAKE 1 TABLET BY MOUTH EVERY DAY 90 tablet 1   omeprazole (PRILOSEC) 40 MG capsule TAKE 1 CAPSULE BY MOUTH EVERY DAY 90 capsule 1   amLODipine (NORVASC) 10 MG tablet TAKE 1 TABLET BY MOUTH EVERY DAY 90 tablet 1   telmisartan (MICARDIS) 80 MG tablet TAKE 1 TABLET BY MOUTH EVERYDAY AT BEDTIME 90 tablet 1   sildenafil (REVATIO) 20 MG tablet Take 1 tablet (20 mg total) by mouth 3 (three) times daily. (Patient not taking: Reported on 08/09/2021) 50 tablet 5   atorvastatin (LIPITOR) 20 MG tablet Take 1 tablet (20 mg total) by mouth daily. (Patient not taking: Reported on 08/09/2021) 90 tablet 3   JARDIANCE 10 MG TABS tablet TAKE 1 TABLET BY MOUTH DAILY BEFORE BREAKFAST. (Patient not taking: Reported on 08/09/2021) 30 tablet 0   metFORMIN (GLUCOPHAGE) 500 MG tablet TAKE 1 TABLET (500 MG TOTAL) BY MOUTH 2 (TWO) TIMES DAILY WITH A MEAL. (Patient not taking: Reported on 08/09/2021) 180 tablet 3   tamsulosin (FLOMAX) 0.4 MG CAPS capsule TAKE 1 CAPSULE (0.4 MG TOTAL) BY MOUTH DAILY. (Patient not taking: Reported on 08/09/2021)  90 capsule 1   Facility-Administered Medications Prior to Visit  Medication Dose Route Frequency Provider Last Rate Last Admin   ipratropium-albuterol (DUONEB) 0.5-2.5 (3) MG/3ML nebulizer solution 3 mL  3 mL Nebulization Q6H Terry Hawthorne, FNP        Review of Systems  Patient denies headache, fevers, malaise, unintentional weight loss, skin rash, eye pain, sinus congestion and sinus pain, sore throat, dysphagia,  hemoptysis , cough, dyspnea, wheezing, chest pain, palpitations, orthopnea, edema, abdominal pain, nausea, melena, diarrhea, constipation, flank pain, dysuria, hematuria, urinary  Frequency, nocturia, numbness, tingling, seizures,  Focal weakness, Loss of consciousness,  Tremor, insomnia, depression, anxiety, and suicidal ideation.     Objective:  BP (!) 162/78 (BP Location: Left Arm, Patient Position: Sitting, Cuff Size: Normal)   Pulse 94   Temp 97.9 F (36.6 C) (Oral)   Ht 5' 11.5" (1.816 m)   Wt 159 lb 3.2 oz (72.2 kg)   SpO2 91%   BMI 21.89 kg/m   Physical Exam  General appearance: alert, cooperative and appears stated age Ears: normal TM's and external ear canals both ears Throat: lips, mucosa, and tongue normal; teeth and gums normal Neck: no adenopathy, no  carotid bruit, supple, symmetrical, trachea midline and thyroid not enlarged, symmetric, no tenderness/mass/nodules Back: symmetric, no curvature. ROM normal. No CVA tenderness. Lungs: clear to auscultation bilaterally Heart: regular rate and rhythm, S1, S2 normal, no murmur, click, rub or gallop Abdomen: soft, non-tender; bowel sounds normal; no masses,  no organomegaly Pulses: 2+ and symmetric Skin: Skin color, texture, turgor normal. No rashes or lesions Lymph nodes: Cervical, supraclavicular, and axillary nodes normal.   Assessment & Plan:   Problem List Items Addressed This Visit     Hyperthyroidism    Previously managed by Terry [name],  Terry [name],  with methimazole, until her  departure. TSH is at goal .  Continue current dose of  5 mg  Lab Results  Component Value Date   TSH 1.06 08/09/2021         Relevant Orders   TSH (Completed)   Diabetic nephropathy associated with type 2 diabetes mellitus (HCC)    Continue ARB       Relevant Medications   telmisartan (MICARDIS) 80 MG tablet   Type 2 DM with CKD stage 3 and hypertension (HCC) - Primary    A1c has droppedd to 6.6.  Would avoid metformin given his chronic respiratory failure and probable underlying compensated respiratory acidosis .. he has stopped statin and Jardiance as well  Lab Results  Component Value Date   HGBA1C 6.6 (H) 08/09/2021         Relevant Medications   amLODipine (NORVASC) 10 MG tablet   telmisartan (MICARDIS) 80 MG tablet   Other Relevant Orders   Urine Microalbumin w/creat. ratio (Completed)   Hemoglobin A1c (Completed)   Comprehensive metabolic panel (Completed)   Hyperlipidemia with target LDL less than 70   Relevant Medications   amLODipine (NORVASC) 10 MG tablet   telmisartan (MICARDIS) 80 MG tablet   Other Relevant Orders   Lipid Panel w/reflex Direct LDL (Completed)   Coronary atherosclerosis due to calcified coronary lesion of native artery    Noted on 2018 CT scan.  No recent functional study or screen for ischemia.  He is sedentary and has no symptoms.  continue statin,   Asa , beta blocker and ARB      Relevant Medications   amLODipine (NORVASC) 10 MG tablet   telmisartan (MICARDIS) 80 MG tablet   COPD, moderate (HCC)    Pursed lip breathing technique demonstrated with good improvement in symptoms.  He has declined continued oxygen supplementation and is aware his prognosis .  DNR and MOST orders signed.       Other Visit Diagnoses     Other fatigue       Relevant Orders   CBC with Differential/Platelet (Completed)       I have discontinued Winona Legato. [facility] "FRANK"'s tamsulosin, metFORMIN, Jardiance, and atorvastatin. I have also changed his  amLODipine. Additionally, I am having him maintain his cyanocobalamin, b complex vitamins, aspirin EC, omeprazole, albuterol, ipratropium-albuterol, sildenafil, metoprolol succinate, methimazole, and telmisartan. We will continue to administer ipratropium-albuterol.  Meds ordered this encounter  Medications   amLODipine (NORVASC) 10 MG tablet    Sig: Take 1 tablet (10 mg total) by mouth daily.    Dispense:  90 tablet    Refill:  1   telmisartan (MICARDIS) 80 MG tablet    Sig: TAKE 1 TABLET BY MOUTH EVERYDAY AT BEDTIME    Dispense:  90 tablet    Refill:  1   DISCONTD: Zoster Vaccine Adjuvanted (SHINGRIX) injection    Sig: Inject 0.5  mLs into the muscle once for 1 dose.    Dispense:  1 each    Refill:  1   Zoster Vaccine Adjuvanted [facility]) injection    Sig: Inject 0.5 mLs into the muscle once for 1 dose.    Dispense:  1 each    Refill:  1    Medications Discontinued During This Encounter  Medication Reason   amLODipine (NORVASC) 10 MG tablet Reorder   telmisartan (MICARDIS) 80 MG tablet Reorder   Zoster Vaccine Adjuvanted [facility]) injection    atorvastatin (LIPITOR) 20 MG tablet    JARDIANCE 10 MG TABS tablet    metFORMIN (GLUCOPHAGE) 500 MG tablet    tamsulosin (FLOMAX) 0.4 MG CAPS capsule    A total of 25 minutes of face to face time was spent with patient more than half of which was spent in reviewing his medical history,  counselling about the above mentioned conditions  and coordination of care    Follow-up: Return in about 3 months (around 11/09/2021).   [name], MD

## 2021-08-10 LAB — MICROALBUMIN / CREATININE URINE RATIO
Creatinine,U: 92.8 mg/dL
Microalb Creat Ratio: 167.7 mg/g — ABNORMAL HIGH (ref 0.0–30.0)
Microalb, Ur: 155.5 mg/dL — ABNORMAL HIGH (ref 0.0–1.9)

## 2021-08-10 LAB — CBC WITH DIFFERENTIAL/PLATELET
Basophils Absolute: 0.1 10*3/uL (ref 0.0–0.1)
Basophils Relative: 1.2 % (ref 0.0–3.0)
Eosinophils Absolute: 0.2 10*3/uL (ref 0.0–0.7)
Eosinophils Relative: 2.4 % (ref 0.0–5.0)
HCT: 38 % — ABNORMAL LOW (ref 39.0–52.0)
Hemoglobin: 12.7 g/dL — ABNORMAL LOW (ref 13.0–17.0)
Lymphocytes Relative: 17.9 % (ref 12.0–46.0)
Lymphs Abs: 1.3 10*3/uL (ref 0.7–4.0)
MCHC: 33.4 g/dL (ref 30.0–36.0)
MCV: 95.8 fl (ref 78.0–100.0)
Monocytes Absolute: 0.7 10*3/uL (ref 0.1–1.0)
Monocytes Relative: 9.6 % (ref 3.0–12.0)
Neutro Abs: 4.9 10*3/uL (ref 1.4–7.7)
Neutrophils Relative %: 68.9 % (ref 43.0–77.0)
Platelets: 306 10*3/uL (ref 150.0–400.0)
RBC: 3.97 Mil/uL — ABNORMAL LOW (ref 4.22–5.81)
RDW: 13.6 % (ref 11.5–15.5)
WBC: 7.1 10*3/uL (ref 4.0–10.5)

## 2021-08-10 LAB — LIPID PANEL W/REFLEX DIRECT LDL
Cholesterol: 212 mg/dL — ABNORMAL HIGH (ref <200)
HDL: 89 mg/dL (ref >=40)
LDL Cholesterol (Calc): 101 mg/dL (calc) — ABNORMAL HIGH
Non-HDL Cholesterol (Calc): 123 mg/dL (calc) (ref <130)
Total CHOL/HDL Ratio: 2.4 (calc) (ref <5.0)
Triglycerides: 127 mg/dL (ref <150)

## 2021-08-10 LAB — COMPREHENSIVE METABOLIC PANEL
ALT: 9 U/L (ref 0–53)
AST: 18 U/L (ref 0–37)
Albumin: 4.1 g/dL (ref 3.5–5.2)
Alkaline Phosphatase: 62 U/L (ref 39–117)
BUN: 17 mg/dL (ref 6–23)
CO2: 27 mEq/L (ref 19–32)
Calcium: 9.8 mg/dL (ref 8.4–10.5)
Chloride: 102 mEq/L (ref 96–112)
Creatinine, Ser: 1.29 mg/dL (ref 0.40–1.50)
GFR: 50.21 mL/min — ABNORMAL LOW (ref >60.00)
Glucose, Bld: 110 mg/dL — ABNORMAL HIGH (ref 70–99)
Potassium: 4.3 mEq/L (ref 3.5–5.1)
Sodium: 138 mEq/L (ref 135–145)
Total Bilirubin: 0.8 mg/dL (ref 0.2–1.2)
Total Protein: 6.8 g/dL (ref 6.0–8.3)

## 2021-08-10 LAB — TSH: TSH: 1.06 u[IU]/mL (ref 0.35–5.50)

## 2021-08-10 LAB — HEMOGLOBIN A1C: Hgb A1c MFr Bld: 6.6 % — ABNORMAL HIGH (ref 4.6–6.5)

## 2021-08-10 NOTE — Assessment & Plan Note (Signed)
Pursed lip breathing technique demonstrated with good improvement in symptoms.  He has declined continued oxygen supplementation and is aware his prognosis .  DNR and MOST orders signed.

## 2021-08-10 NOTE — Assessment & Plan Note (Signed)
A1c has droppedd to 6.6.  Would avoid metformin given his chronic respiratory failure and probable underlying compensated respiratory acidosis .. he has stopped statin and Jardiance as well  Lab Results  Component Value Date   HGBA1C 6.6 (H) 08/09/2021

## 2021-08-10 NOTE — Assessment & Plan Note (Signed)
--  Continue ARB 

## 2021-08-10 NOTE — Assessment & Plan Note (Signed)
Previously managed by Dr [name],  [facility],  with methimazole, until her departure. TSH is at goal .  Continue current dose of  5 mg  Lab Results  Component Value Date   TSH 1.06 08/09/2021

## 2021-08-10 NOTE — Assessment & Plan Note (Signed)
Noted on 2018 CT scan.  No recent functional study or screen for ischemia.  He is sedentary and has no symptoms.  continue statin,   Asa , beta blocker and ARB

## 2021-09-01 ENCOUNTER — Other Ambulatory Visit: Payer: Self-pay

## 2021-09-01 NOTE — Patient Outreach (Signed)
  Care Coordination   09/01/2021 Name: Terry [name] MRN: [id number] DOB: 12-19-1933   Care Coordination Outreach Attempts:  An unsuccessful telephone outreach was attempted today to offer the patient information about available care coordination services as a benefit of their health plan.  HIPAA compliant voice message left with call back phone number.  Return call requested  Follow Up Plan:  Additional outreach attempts will be made to offer the patient care coordination information and services.   Encounter Outcome:  No Answer  Care Coordination Interventions Activated:  No   Care Coordination Interventions:  No, not indicated    Quinn Plowman RN,BSN,CCM RN Care Manager Coordinator [phone]

## 2021-11-07 ENCOUNTER — Other Ambulatory Visit: Payer: Self-pay | Admitting: Internal Medicine

## 2021-11-10 ENCOUNTER — Other Ambulatory Visit: Payer: Self-pay | Admitting: Internal Medicine

## 2021-11-10 ENCOUNTER — Telehealth: Payer: Self-pay

## 2021-11-10 ENCOUNTER — Encounter: Payer: Self-pay | Admitting: Internal Medicine

## 2021-11-10 ENCOUNTER — Ambulatory Visit (INDEPENDENT_AMBULATORY_CARE_PROVIDER_SITE_OTHER): Payer: PPO | Admitting: Internal Medicine

## 2021-11-10 VITALS — BP 160/84 | HR 86 | Temp 97.3°F | Ht 71.5 in | Wt 157.2 lb

## 2021-11-10 DIAGNOSIS — E785 Hyperlipidemia, unspecified: Secondary | ICD-10-CM

## 2021-11-10 DIAGNOSIS — Z23 Encounter for immunization: Secondary | ICD-10-CM | POA: Diagnosis not present

## 2021-11-10 DIAGNOSIS — E1121 Type 2 diabetes mellitus with diabetic nephropathy: Secondary | ICD-10-CM | POA: Diagnosis not present

## 2021-11-10 DIAGNOSIS — R809 Proteinuria, unspecified: Secondary | ICD-10-CM | POA: Diagnosis not present

## 2021-11-10 DIAGNOSIS — E1129 Type 2 diabetes mellitus with other diabetic kidney complication: Secondary | ICD-10-CM

## 2021-11-10 DIAGNOSIS — I10 Essential (primary) hypertension: Secondary | ICD-10-CM | POA: Diagnosis not present

## 2021-11-10 DIAGNOSIS — I129 Hypertensive chronic kidney disease with stage 1 through stage 4 chronic kidney disease, or unspecified chronic kidney disease: Secondary | ICD-10-CM | POA: Diagnosis not present

## 2021-11-10 DIAGNOSIS — J449 Chronic obstructive pulmonary disease, unspecified: Secondary | ICD-10-CM

## 2021-11-10 DIAGNOSIS — E1122 Type 2 diabetes mellitus with diabetic chronic kidney disease: Secondary | ICD-10-CM | POA: Diagnosis not present

## 2021-11-10 DIAGNOSIS — N183 Chronic kidney disease, stage 3 unspecified: Secondary | ICD-10-CM | POA: Diagnosis not present

## 2021-11-10 MED ORDER — RSVPREF3 VAC RECOMB ADJUVANTED 120 MCG/0.5ML IM SUSR: 0.5000 mL | Freq: Once | INTRAMUSCULAR | 0 refills | Status: AC

## 2021-11-10 MED ORDER — TADALAFIL 5 MG PO TABS: 5.0000 mg | ORAL_TABLET | Freq: Every day | ORAL | 0 refills | Status: DC | PRN

## 2021-11-10 NOTE — Progress Notes (Unsigned)
Subjective:  Patient ID: Terry [name], male    DOB: 01/18/1934  Age: 88 y.o. MRN: [id number]  CC: The primary encounter diagnosis was Type 2 DM with CKD stage 3 and hypertension ([place]). Diagnoses of Hyperlipidemia with target LDL less than 70, COPD, moderate ([place]), Need for immunization against influenza, Diabetic nephropathy associated with type 2 diabetes mellitus ([place]), Primary hypertension, and Microalbuminuria due to type 2 diabetes mellitus ([place]) were also pertinent to this visit.   HPI Terry [name] presents for follow up on chronic conditions  Chief Complaint  Patient presents with   Follow-up    3 month follow up on diabetes   1) HTN: TAKING AMLODIPINE  and  TELMISARTAN  AT [facility] at  XL 50 MG   2) COPD:  has been unable to get NEBULIZED MEDICATIONS SINCE THE NEBULIZER  WAS GIVEN TO HIM A YEAR AGO due to insurance refusing to authorize   3) ED:  SILDENAFIL NOW COSTING HIM $200   4) T2DM:    he  feels generally well,  But is not  exercising regularly or trying to lose weight. Not Checking  blood sugars.  Taking   medications as directed. Following a carbohydrate modified diet 6 days per week. Denies numbness, burning and tingling of extremities. Appetite is good.    Outpatient Medications Prior to Visit  Medication Sig Dispense Refill   albuterol (VENTOLIN HFA) 108 (90 Base) MCG/ACT inhaler Inhale 2 puffs into the lungs every 6 (six) hours as needed for wheezing or shortness of breath. 3.7 g 11   amLODipine (NORVASC) 10 MG tablet Take 1 tablet (10 mg total) by mouth daily. 90 tablet 1   aspirin EC 81 MG tablet Take 1 tablet (81 mg total) by mouth daily. 90 tablet 3   b complex vitamins tablet Take 1 tablet by mouth daily.     cyanocobalamin 1000 MCG tablet Take 1,000 mcg by mouth daily.     ipratropium-albuterol (DUONEB) 0.5-2.5 (3) MG/3ML SOLN Take 3 mLs by nebulization every 6 (six) hours as needed. 360 mL 2   methimazole (TAPAZOLE) 5 MG tablet TAKE 1 TABLET  BY MOUTH EVERY DAY 90 tablet 1   metoprolol succinate (TOPROL-XL) 50 MG 24 hr tablet TAKE 1 TABLET BY MOUTH EVERY DAY 90 tablet 1   omeprazole (PRILOSEC) 40 MG capsule TAKE 1 CAPSULE BY MOUTH EVERY DAY 90 capsule 1   telmisartan (MICARDIS) 80 MG tablet TAKE 1 TABLET BY MOUTH EVERYDAY AT BEDTIME 90 tablet 1   sildenafil (REVATIO) 20 MG tablet Take 1 tablet (20 mg total) by mouth 3 (three) times daily. 50 tablet 5   Facility-Administered Medications Prior to Visit  Medication Dose Route Frequency Provider Last Rate Last Admin   ipratropium-albuterol (DUONEB) 0.5-2.5 (3) MG/3ML nebulizer solution 3 mL  3 mL Nebulization Q6H Burnard Hawthorne, FNP        Review of Systems;  Patient denies headache, fevers, malaise, unintentional weight loss, skin rash, eye pain, sinus congestion and sinus pain, sore throat, dysphagia,  hemoptysis , cough, dyspnea, wheezing, chest pain, palpitations, orthopnea, edema, abdominal pain, nausea, melena, diarrhea, constipation, flank pain, dysuria, hematuria, urinary  Frequency, nocturia, numbness, tingling, seizures,  Focal weakness, Loss of consciousness,  Tremor, insomnia, depression, anxiety, and suicidal ideation.      Objective:  BP (!) 160/84 (BP Location: Left Arm, Patient Position: Sitting, Cuff Size: Normal)   Pulse 86   Temp (!) 97.3 F (36.3 C) (Oral)   Ht  5' 11.5" (1.816 m)   Wt 157 lb 3.2 oz (71.3 kg)   SpO2 93%   BMI 21.62 kg/m   BP Readings from Last 3 Encounters:  11/10/21 (!) 160/84  08/09/21 (!) 162/78  11/02/20 (!) 158/68    Wt Readings from Last 3 Encounters:  11/10/21 157 lb 3.2 oz (71.3 kg)  08/09/21 159 lb 3.2 oz (72.2 kg)  07/11/21 168 lb (76.2 kg)    General appearance: alert, cooperative and appears stated age Ears: normal TM's and external ear canals both ears Throat: lips, mucosa, and tongue normal; teeth and gums normal Neck: no adenopathy, no carotid bruit, supple, symmetrical, trachea midline and thyroid not enlarged,  symmetric, no tenderness/mass/nodules Back: symmetric, no curvature. ROM normal. No CVA tenderness. Lungs: clear to auscultation bilaterally  Heart: regular rate and rhythm, S1, S2 normal, no murmur, click, rub or gallop Abdomen: soft, non-tender; bowel sounds normal; no masses,  no organomegaly Pulses: 2+ and symmetric Skin: Skin color, texture, turgor normal. No rashes or lesions Lymph nodes: Cervical, supraclavicular, and axillary nodes normal. Neuro:  awake and interactive with normal mood and affect. Higher cortical functions are normal. Speech is clear without word-finding difficulty or dysarthria. Extraocular movements are intact. Visual fields of both eyes are grossly intact. Sensation to light touch is grossly intact bilaterally of upper and lower extremities. Motor examination shows 4+/5 symmetric hand grip and upper extremity and 5/5 lower extremity strength. There is no pronation or drift. Gait is non-ataxic   Lab Results  Component Value Date   HGBA1C 6.6 (H) 08/09/2021   HGBA1C 6.8 (H) 11/02/2020   HGBA1C 6.1 (A) 04/07/2020    Lab Results  Component Value Date   CREATININE 1.29 08/09/2021   CREATININE 1.33 11/02/2020   CREATININE 1.49 04/07/2020    Lab Results  Component Value Date   WBC 7.1 08/09/2021   HGB 12.7 (L) 08/09/2021   HCT 38.0 (L) 08/09/2021   PLT 306.0 08/09/2021   GLUCOSE 110 (H) 08/09/2021   CHOL 212 (H) 08/09/2021   TRIG 127 08/09/2021   HDL 89 08/09/2021   LDLDIRECT 49.0 03/12/2016   LDLCALC 101 (H) 08/09/2021   ALT 9 08/09/2021   AST 18 08/09/2021   NA 138 08/09/2021   K 4.3 08/09/2021   CL 102 08/09/2021   CREATININE 1.29 08/09/2021   BUN 17 08/09/2021   CO2 27 08/09/2021   TSH 1.06 08/09/2021   PSA 0.75 11/12/2013   INR 0.9 12/12/2012   HGBA1C 6.6 (H) 08/09/2021   MICROALBUR 155.5 (H) 08/09/2021   Assessment & Plan:   Problem List Items Addressed This Visit     COPD, moderate ([place])    Continue home nebulizer use for cost savings  .  He has declined continued oxygen supplementation and is aware his prognosis .  DNR and MOST orders signed.       Relevant Orders   AMB Referral to Pharmacy Medication Management   Diabetic nephropathy associated with type 2 diabetes mellitus ([place])    Maintained with dietary management.  Avoiding  metformin given his chronic respiratory failure and probable underlying compensated respiratory acidosis .. he has stopped statin and Jardiance as well   Continue ARB   Lab Results  Component Value Date   HGBA1C 6.6 (H) 08/09/2021        Hyperlipidemia with target LDL less than 70   Relevant Medications   tadalafil (CIALIS) 5 MG tablet   Other Relevant Orders   Lipid Profile   Direct  LDL   Hypertension    he reports compliance with medication regimen  but has an elevated reading today in office.  he is not using NSAIDs daily.  Discussed goal of 130/80  to preserve renal function.  he has been asked to check his  BP  at home and  submit readings for evaluation. Renal function, electrolytes and screen for proteinuria are all normal .      Relevant Medications   tadalafil (CIALIS) 5 MG tablet   Microalbuminuria due to type 2 diabetes mellitus (HCC)    A1c has droppedd to 6.6.  Would avoid metformin given his chronic respiratory failure and probable underlying compensated respiratory acidosis .. he has stopped statin and Jardiance as well  Lab Results  Component Value Date   HGBA1C 6.6 (H) 08/09/2021         Type 2 DM with CKD stage 3 and hypertension ([place]) - Primary    managedwith diet.  Avoiding  metformin given his chronic respiratory failure and probable underlying compensated respiratory acidosis .. he has stopped statin and Jardiance as well  Lab Results  Component Value Date   HGBA1C 6.6 (H) 08/09/2021         Relevant Medications   tadalafil (CIALIS) 5 MG tablet   Other Relevant Orders   Comp Met (CMET)   HgB A1c   Other Visit Diagnoses     Need for immunization  against influenza       Relevant Orders   Flu Vaccine QUAD High Dose(Fluad) (Completed)         Follow-up: No follow-ups on file.   [name], MD

## 2021-11-10 NOTE — Chronic Care Management (AMB) (Unsigned)
   Care Guide Note  11/10/2021 Name: Terry [name] MRN: [id number] DOB: [birth year]  Referred by: [name], MD Reason for referral : Care Coordination (Outreach to schedule referral with Pharm D [name] )   Terry [name] is a 88 y.o. year old male who is a primary care patient of [name], [name], MD. Terry [name] was referred to the pharmacist for assistance related to DM.    An unsuccessful telephone outreach was attempted today to contact the patient who was referred to the pharmacy team for assistance with medication assistance. Additional attempts will be made to contact the patient.   [name], [name], [place] [address] Direct Dial: [phone] [email or web address]

## 2021-11-10 NOTE — Patient Instructions (Addendum)
  The new goals for optimal blood pressure management are  1300/80 OR LESS .  YOUR BP WAS HIGH TODAY .      Please check your blood pressure a few times at home and send me the readings so I can determine if you need a change in medication   ( WE CAN INCREASE TOPROL IF NEEDED)   I HAVE ORDERED  PHARMACY CONSULT TO HELP FIGURE OUT WHY YOUR NEBULIZER MEDS ARE NOT BEING COVERED   I HAVE CHANGED VIAGRA TO CIALIS

## 2021-11-12 NOTE — Assessment & Plan Note (Signed)
he reports compliance with medication regimen  but has an elevated reading today in office.  he is not using NSAIDs daily.  Discussed goal of 130/80  to preserve renal function.  he has been asked to check his  BP  at home and  submit readings for evaluation. Renal function, electrolytes and screen for proteinuria are all normal .

## 2021-11-12 NOTE — Assessment & Plan Note (Signed)
A1c has droppedd to 6.6.  Would avoid metformin given his chronic respiratory failure and probable underlying compensated respiratory acidosis .. he has stopped statin and Jardiance as well  Lab Results  Component Value Date   HGBA1C 6.6 (H) 08/09/2021

## 2021-11-12 NOTE — Assessment & Plan Note (Signed)
managedwith diet.  Avoiding  metformin given his chronic respiratory failure and probable underlying compensated respiratory acidosis .. he has stopped statin and Jardiance as well  Lab Results  Component Value Date   HGBA1C 6.6 (H) 08/09/2021

## 2021-11-12 NOTE — Assessment & Plan Note (Signed)
Continue home nebulizer use for cost savings .  He has declined continued oxygen supplementation and is aware his prognosis .  DNR and MOST orders signed.

## 2021-11-12 NOTE — Assessment & Plan Note (Signed)
Maintained with dietary management.  Avoiding  metformin given his chronic respiratory failure and probable underlying compensated respiratory acidosis .. he has stopped statin and Jardiance as well   Continue ARB   Lab Results  Component Value Date   HGBA1C 6.6 (H) 08/09/2021

## 2021-11-13 NOTE — Chronic Care Management (AMB) (Signed)
   Care Guide Note  11/13/2021 Name: Terry [name] MRN: [id number] DOB: 1933-11-30  Referred by: [name], MD Reason for referral : Care Coordination (Outreach to schedule referral with Pharm D [name] )   Terry [name] is a 88 y.o. year old male who is a primary care patient of [name], [name], MD. Terry [name] was referred to the pharmacist for assistance related to DM.    Successful contact was made with the patient to discuss pharmacy services including being ready for the pharmacist to call at least 5 minutes before the scheduled appointment time, to have medication bottles and any blood sugar or blood pressure readings ready for review. The patient agreed to meet with the pharmacist via with the pharmacist via telephone visit on 11/27/2021.    [name], [place], [place] [address] Direct Dial: [phone] [email or web address]

## 2021-11-27 ENCOUNTER — Other Ambulatory Visit: Payer: PPO | Admitting: Pharmacist

## 2021-11-27 DIAGNOSIS — J449 Chronic obstructive pulmonary disease, unspecified: Secondary | ICD-10-CM

## 2021-11-27 MED ORDER — IPRATROPIUM-ALBUTEROL 0.5-2.5 (3) MG/3ML IN SOLN: 3.0000 mL | Freq: Four times a day (QID) | RESPIRATORY_TRACT | 2 refills | Status: DC | PRN

## 2021-11-27 NOTE — Patient Instructions (Addendum)
Pilar Plate,   It was great talking to you today!  Let me know if there are any issues with the nebulizer medication.   Consider purchasing a good quality blood pressure machine, such as an Omron-brand upper arm cuff. This can be purchased from any pharmacy, including our [facility] outpatient pharmacies, for ~$28-$30. We recommend a blood pressure cuff that goes around your upper arm, as these are generally the most accurate.   Take care!  [name] Hedwig Morton, PharmD, [facility] [phone]

## 2021-11-27 NOTE — Progress Notes (Signed)
11/27/2021 Name: ODES LOLLI MRN: [id number] DOB: [birth year]  Chief Complaint  Patient presents with   Medication Management   Diabetes    Terry [name] is a 88 y.o. year old male who presented for a telephone visit.   They were referred to the pharmacist by their PCP for assistance in managing medication access.   Subjective:  Care Team: Primary Care Provider: [name], MD ; Next Scheduled Visit: 07/13/22  Medication Access/Adherence  [id number] [place] [id number]: [phone]: [phone]  Patient reports affordability concerns with their medications: Yes  Patient reports access/transportation concerns to their pharmacy: No  Patient reports adherence concerns with their medications:  Yes  reports he does not have ipratropium/albuterol;   Hypertension:  Current medications: amlodipine 10 mg daily, metoprolol succinate 50 mg daily, telmisartan 80 mg daily Medications previously tried:   Patient does not have a validated, automated, upper arm home BP cuff   Patient denies hypotensive s/sx including dizziness, lightheadedness.  Patient denies hypertensive symptoms including headache, chest pain, shortness of breath  COPD:  Current medications: albuterol HFA PRN; he is prescribed ipratropium/albuterol via nebulizer but [facility] Reports 0 exacerbations in the past year  Reports using albuterol infrequently.   Health Maintenance  Health Maintenance Due  Topic Date Due   COVID-19 Vaccine (4 - Pfizer series) 01/25/2020   OPHTHALMOLOGY EXAM  10/24/2021     Objective: Lab Results  Component Value Date   HGBA1C 6.6 (H) 08/09/2021    Lab Results  Component Value Date   CREATININE 1.29 08/09/2021   BUN 17 08/09/2021   NA 138 08/09/2021   K 4.3 08/09/2021   CL 102 08/09/2021   CO2 27 08/09/2021    Lab Results  Component  Value Date   CHOL 212 (H) 08/09/2021   HDL 89 08/09/2021   LDLCALC 101 (H) 08/09/2021   LDLDIRECT 49.0 03/12/2016   TRIG 127 08/09/2021   CHOLHDL 2.4 08/09/2021    Medications Reviewed Today     Reviewed by HOsker Mason RPH-CPP (Pharmacist) on 11/27/21 at 1406  Med List Status: <None>   Medication Order Taking? Sig Documenting Provider Last Dose Status Informant  albuterol (VENTOLIN HFA) 108 (90 Base) MCG/ACT inhaler [id number] Inhale 2 puffs into the lungs every 6 (six) hours as needed for wheezing or shortness of breath.  Patient not taking: Reported on 11/27/2021   TCrecencio Mc MD Not Taking Active   amLODipine (NORVASC) 10 MG tablet [id number] Take 1 tablet (10 mg total) by mouth daily. TCrecencio Mc MD Taking Active   aspirin EC 81 MG tablet [id number] Take 1 tablet (81 mg total) by mouth daily. AWellington Hampshire MD Taking Active   b complex vitamins tablet [id number] Take 1 tablet by mouth daily. [provider] Taking Active   cyanocobalamin 1000 MCG tablet [id number] Take 1,000 mcg by mouth daily. [provider] Taking Active   ipratropium-albuterol (DUONEB) 0.5-2.5 (3) MG/3ML nebulizer solution 3 mL [id number]  ABurnard Hawthorne FNP  Active   ipratropium-albuterol (DUONEB) 0.5-2.5 (3) MG/3ML SOLN [id number] Take 3 mLs by nebulization every 6 (six) hours as needed.  Patient not taking: Reported on 11/27/2021   TCrecencio Mc MD Not Taking Active   methimazole (TAPAZOLE) 5 MG tablet [id number] TAKE 1 TABLET BY MOUTH EVERY DAY TDeborra Medina  L, MD Taking Active   metoprolol succinate (TOPROL-XL) 50 MG 24 hr tablet [id number] Yes TAKE 1 TABLET BY MOUTH EVERY DAY [name], MD Taking Active   omeprazole (PRILOSEC) 40 MG capsule [id number] No TAKE 1 CAPSULE BY MOUTH EVERY DAY  Patient not taking: Reported on 11/27/2021   Leone Haven, MD Not Taking Active   tadalafil (CIALIS) 5 MG tablet [id number] No Take 1 tablet (5 mg  total) by mouth daily as needed for erectile dysfunction.  Patient not taking: Reported on 11/27/2021   [name], MD Not Taking Active   telmisartan (MICARDIS) 80 MG tablet [id number] Yes TAKE 1 TABLET BY MOUTH EVERYDAY AT BEDTIME [name], MD Taking Active   Med List Note [name], [name], MD 04/12/19 1206): am              Assessment/Plan:   Hypertension: - Currently uncontrolled - Reviewed insurance benefits. There are not over the counter benefits with his plan. Discussed purchasing Omron upper arm BP cuff  - Reviewed appropriate blood pressure monitoring technique and reviewed goal blood pressure. Recommended to check home blood pressure and heart rate daily for 2 weeks.  - Recommend to continue current regimen at this time.    COPD: - Currently uncontrolled due to lack of medication access - Contacted patient's insurance. They cover nebulized medications under Part B. Confirmed diagnosis code, date of receiving nebulizer and that he owns the equipment. They were able to fill the prescription. Patient notified.   Follow Up Plan: phone call in 3 weeks for hypertension  [name] Hedwig Morton, PharmD, [facility] [phone]

## 2021-12-26 ENCOUNTER — Other Ambulatory Visit: Payer: PPO | Admitting: Pharmacist

## 2021-12-26 ENCOUNTER — Telehealth: Payer: Self-pay | Admitting: Pharmacist

## 2021-12-26 NOTE — Progress Notes (Unsigned)
Attempted to contact patient for scheduled appointment for medication management. Left HIPAA compliant message for patient to return my call at their convenience.    [name] T. Karin Pinedo, PharmD, BCACP, CPP [facility] [phone]  

## 2022-01-30 ENCOUNTER — Telehealth: Payer: Self-pay | Admitting: Pharmacist

## 2022-01-30 ENCOUNTER — Other Ambulatory Visit: Payer: PPO | Admitting: Pharmacist

## 2022-01-30 NOTE — Progress Notes (Signed)
Patient contacted clinic front desk and asked to cancel appointment today. Will route to scheduler to reschedule follow up with me.  [name] Hedwig Morton, PharmD, [place], [facility] [phone]

## 2022-02-01 ENCOUNTER — Telehealth: Payer: Self-pay | Admitting: Internal Medicine

## 2022-02-01 NOTE — Telephone Encounter (Signed)
Pt is scheduled tomorrow at 4:30 with Dr. [name].

## 2022-02-01 NOTE — Telephone Encounter (Signed)
Pt wife called stating the pt is coughing, sneezing, having difficulty breathing and having pain in his chest. Sent to access nurse

## 2022-02-02 ENCOUNTER — Encounter: Payer: Self-pay | Admitting: Internal Medicine

## 2022-02-02 ENCOUNTER — Ambulatory Visit (INDEPENDENT_AMBULATORY_CARE_PROVIDER_SITE_OTHER): Payer: PPO | Admitting: Internal Medicine

## 2022-02-02 VITALS — BP 130/80 | HR 108 | Temp 98.0°F | Ht 71.5 in | Wt 157.2 lb

## 2022-02-02 DIAGNOSIS — J441 Chronic obstructive pulmonary disease with (acute) exacerbation: Secondary | ICD-10-CM | POA: Diagnosis not present

## 2022-02-02 DIAGNOSIS — R051 Acute cough: Secondary | ICD-10-CM

## 2022-02-02 MED ORDER — IPRATROPIUM-ALBUTEROL 0.5-2.5 (3) MG/3ML IN SOLN
3.0000 mL | Freq: Once | RESPIRATORY_TRACT | Status: AC
  Administered 2022-02-02: 3 mL via RESPIRATORY_TRACT

## 2022-02-02 MED ORDER — IPRATROPIUM-ALBUTEROL 0.5-2.5 (3) MG/3ML IN SOLN: 3.0000 mL | Freq: Once | RESPIRATORY_TRACT | Status: DC

## 2022-02-02 MED ORDER — BUDESONIDE 0.5 MG/2ML IN SUSP: 0.5000 mg | Freq: Every day | RESPIRATORY_TRACT | 12 refills | Status: DC

## 2022-02-02 MED ORDER — METHYLPREDNISOLONE ACETATE 80 MG/ML IJ SUSP: 80.0000 mg | Freq: Once | INTRAMUSCULAR | Status: DC

## 2022-02-02 MED ORDER — CHERATUSSIN AC 100-10 MG/5ML PO SOLN: 5.0000 mL | Freq: Three times a day (TID) | ORAL | 0 refills | Status: DC | PRN

## 2022-02-02 MED ORDER — CEFDINIR 300 MG PO CAPS: 300.0000 mg | ORAL_CAPSULE | Freq: Two times a day (BID) | ORAL | 0 refills | Status: DC

## 2022-02-02 MED ORDER — AZITHROMYCIN 500 MG PO TABS: 500.0000 mg | ORAL_TABLET | Freq: Every day | ORAL | 0 refills | Status: DC

## 2022-02-02 MED ORDER — METHYLPREDNISOLONE ACETATE 40 MG/ML IJ SUSP
40.0000 mg | Freq: Once | INTRAMUSCULAR | Status: AC
  Administered 2022-02-02: 40 mg via INTRAMUSCULAR

## 2022-02-02 MED ORDER — PREDNISONE 10 MG PO TABS: ORAL_TABLET | ORAL | 0 refills | Status: DC

## 2022-02-02 NOTE — Patient Instructions (Signed)
Start the prednisone tomorrow , you received an IM dose today  Start both antibiotics TONIGHT (cefdinir and azithromycin)  ADDING budesonide nebulizer once daily to your home regimen  Adding cough suppressant (cheratussin )  Continue Duonebs every 6 hours.    If your breathing becomes worse you need to call 911 and go to the hospital

## 2022-02-02 NOTE — Progress Notes (Unsigned)
Subjective:  Patient ID: Terry [name], male    DOB: 02-Sep-1933  Age: 89 y.o. MRN: [id number]  CC: The primary encounter diagnosis was Acute cough. A diagnosis of COPD with acute exacerbation ([place]) was also pertinent to this visit.   HPI Terry [name] presents for  Chief Complaint  Patient presents with   Cough    Cough, congestion, SOBr    Terry [name] is an 89 yr old male with advanced COPD ,, chronic hypoxic respiratory failure , no longer using 02 by choice , last seen Oct 2023, DNR and MOST orders signed, in 2022  who presents with cough , dyspnea and chest pain for the past 8 days without improvement .he was advised yesterday to go  to urgent care  but refused .   Symptoms started after a family gathering at which time nobody appeared ill. However de developed cough and congestion several days later but has not checked  himself for COVID>  symptoms were worse on Tuesday (3 days ago)  with persistent cough that was uncontrolled despite recurrent use of otc cough suppressants.  The cough has improved slightly since Tuesday but he has felt weak and fatigued.  Appetite has been poor, and he has been short of breath at rest.  He has been using his nebulized  atrovent 3 times daily , last time at noon  today .    Ambulatory sats on room air are 90%    Outpatient Medications Prior to Visit  Medication Sig Dispense Refill   albuterol (VENTOLIN HFA) 108 (90 Base) MCG/ACT inhaler Inhale 2 puffs into the lungs every 6 (six) hours as needed for wheezing or shortness of breath. 3.7 g 11   amLODipine (NORVASC) 10 MG tablet Take 1 tablet (10 mg total) by mouth daily. 90 tablet 1   aspirin EC 81 MG tablet Take 1 tablet (81 mg total) by mouth daily. 90 tablet 3   b complex vitamins tablet Take 1 tablet by mouth daily.     cyanocobalamin 1000 MCG tablet Take 1,000 mcg by mouth daily.     ipratropium-albuterol (DUONEB) 0.5-2.5 (3) MG/3ML SOLN Take 3 mLs by nebulization every 6 (six) hours as needed. 360  mL 2   methimazole (TAPAZOLE) 5 MG tablet TAKE 1 TABLET BY MOUTH EVERY DAY 90 tablet 1   metoprolol succinate (TOPROL-XL) 50 MG 24 hr tablet TAKE 1 TABLET BY MOUTH EVERY DAY 90 tablet 1   omeprazole (PRILOSEC) 40 MG capsule TAKE 1 CAPSULE BY MOUTH EVERY DAY 90 capsule 1   tadalafil (CIALIS) 5 MG tablet Take 1 tablet (5 mg total) by mouth daily as needed for erectile dysfunction. 10 tablet 0   telmisartan (MICARDIS) 80 MG tablet TAKE 1 TABLET BY MOUTH EVERYDAY AT BEDTIME 90 tablet 1   Facility-Administered Medications Prior to Visit  Medication Dose Route Frequency Provider Last Rate Last Admin   ipratropium-albuterol (DUONEB) 0.5-2.5 (3) MG/3ML nebulizer solution 3 mL  3 mL Nebulization Q6H Burnard Hawthorne, FNP        Review of Systems;  Patient denies headache, fevers, malaise, unintentional weight loss, skin rash, eye pain, sinus congestion and sinus pain, sore throat, dysphagia,  hemoptysis , cough, dyspnea, wheezing, chest pain, palpitations, orthopnea, edema, abdominal pain, nausea, melena, diarrhea, constipation, flank pain, dysuria, hematuria, urinary  Frequency, nocturia, numbness, tingling, seizures,  Focal weakness, Loss of consciousness,  Tremor, insomnia, depression, anxiety, and suicidal ideation.      Objective:  BP 130/80   Pulse Marland Kitchen)  108   Temp 98 F (36.7 C) (Oral)   Ht 5' 11.5" (1.816 m)   Wt 157 lb 3.2 oz (71.3 kg)   SpO2 90%   BMI 21.62 kg/m   BP Readings from Last 3 Encounters:  02/02/22 130/80  11/10/21 (!) 160/84  08/09/21 (!) 162/78    Wt Readings from Last 3 Encounters:  02/02/22 157 lb 3.2 oz (71.3 kg)  11/10/21 157 lb 3.2 oz (71.3 kg)  08/09/21 159 lb 3.2 oz (72.2 kg)    Physical Exam Vitals reviewed.  Constitutional:      General: He is not in acute distress.    Appearance: He is not ill-appearing, toxic-appearing or diaphoretic.     Comments: Cachectic   HENT:     Head: Normocephalic.  Eyes:     General: No scleral icterus.       Right  eye: No discharge.        Left eye: No discharge.     Conjunctiva/sclera: Conjunctivae normal.  Pulmonary:     Comments: Not speaking in full sentences .  Pursed lip breathing  Breath sounds diminished in all fields.  Musculoskeletal:        General: Normal range of motion.     Cervical back: Normal range of motion.  Skin:    General: Skin is warm and dry.  Neurological:     General: No focal deficit present.     Mental Status: He is alert and oriented to person, place, and time. Mental status is at baseline.  Psychiatric:        Mood and Affect: Mood normal.        Behavior: Behavior normal.        Thought Content: Thought content normal.        Judgment: Judgment normal.     Lab Results  Component Value Date   HGBA1C 6.6 (H) 08/09/2021   HGBA1C 6.8 (H) 11/02/2020   HGBA1C 6.1 (A) 04/07/2020    Lab Results  Component Value Date   CREATININE 1.29 08/09/2021   CREATININE 1.33 11/02/2020   CREATININE 1.49 04/07/2020    Lab Results  Component Value Date   WBC 7.1 08/09/2021   HGB 12.7 (L) 08/09/2021   HCT 38.0 (L) 08/09/2021   PLT 306.0 08/09/2021   GLUCOSE 110 (H) 08/09/2021   CHOL 212 (H) 08/09/2021   TRIG 127 08/09/2021   HDL 89 08/09/2021   LDLDIRECT 49.0 03/12/2016   LDLCALC 101 (H) 08/09/2021   ALT 9 08/09/2021   AST 18 08/09/2021   NA 138 08/09/2021   K 4.3 08/09/2021   CL 102 08/09/2021   CREATININE 1.29 08/09/2021   BUN 17 08/09/2021   CO2 27 08/09/2021   TSH 1.06 08/09/2021   PSA 0.75 11/12/2013   INR 0.9 12/12/2012   HGBA1C 6.6 (H) 08/09/2021   MICROALBUR 155.5 (H) 08/09/2021    US Abdomen Complete  Result Date: 10/23/2016 CLINICAL DATA:  Weight loss, abdominal pain EXAM: ABDOMEN ULTRASOUND COMPLETE COMPARISON:  CT 08/15/2016 FINDINGS: Gallbladder: No gallstones or wall thickening visualized. No sonographic Murphy sign noted by sonographer. Common bile duct: Diameter: Normal caliber, 3 mm. Liver: No focal lesion identified. Within normal limits  in parenchymal echogenicity. Portal vein is patent on color Doppler imaging with normal direction of blood flow towards the liver. IVC: No abnormality visualized. Pancreas: Not well visualized due to overlying bowel gas. Spleen: Size and appearance within normal limits. Right Kidney: Length: 11.0 cm. 2.7 cm cyst off the lower  pole. No hydronephrosis. Normal echotexture. Left Kidney: Length: 11.3 cm. Echogenicity within normal limits. No mass or hydronephrosis visualized. Abdominal aorta: No aneurysm visualized. Other findings: None. IMPRESSION: No acute findings or significant abnormality. Electronically Signed   By: Rolm Baptise M.D.   On: 10/23/2016 09:13   DG Chest 2 View  Result Date: 10/23/2016 CLINICAL DATA:  Right lower chest pain for 2 months, some weight loss, history of COPD EXAM: CHEST  2 VIEW COMPARISON:  Chest x-ray of 10/02/2012 FINDINGS: The lungs remain clear but hyperaerated suggestive of mild emphysematous change. Mediastinal and hilar contours are unremarkable. Minimal basilar linear scarring is present. No pneumonia or effusion is seen. The heart is within normal limits in size. Only mild degenerative changes present in the lower thoracic spine for age. IMPRESSION: Hyperaeration may indicate mild emphysematous change. No active lung disease. Electronically Signed   By: Ivar Drape M.D.   On: 10/23/2016 08:39    Assessment & Plan:  .Acute cough -     Respiratory virus panel -     POC COVID-19 BinaxNow -     POCT Influenza A/B  COPD with acute exacerbation (HCC) Assessment & Plan: Empiric antibiotics and steroids prescribed .  Flu and COVID negative.  He has declined ER evaluation and has continued to request DNR status.    Orders: -     Ipratropium-Albuterol -     methylPREDNISolone Acetate  Other orders -     Budesonide; Take 2 mLs (0.5 mg total) by nebulization daily.  Dispense: 60 mL; Refill: 12 -     predniSONE; 6 tablets daily for 3 days, then reduce by 1 tablet daily  until gone  Dispense: 33 tablet; Refill: 0 -     Cefdinir; Take 1 capsule (300 mg total) by mouth 2 (two) times daily.  Dispense: 14 capsule; Refill: 0 -     Azithromycin; Take 1 tablet (500 mg total) by mouth daily.  Dispense: 7 tablet; Refill: 0 -     Cheratussin AC; Take 5 mLs by mouth 3 (three) times daily as needed for cough.  Dispense: 120 mL; Refill: 0   .   Follow-up: No follow-ups on file.   [name], MD

## 2022-02-03 LAB — POCT INFLUENZA A/B

## 2022-02-03 LAB — POC COVID19 BINAXNOW

## 2022-02-03 NOTE — Assessment & Plan Note (Signed)
Empiric antibiotics and steroids prescribed .  Flu and COVID negative.  He has declined ER evaluation and has continued to request DNR status.

## 2022-02-05 LAB — RESPIRATORY VIRUS PANEL

## 2022-02-10 ENCOUNTER — Other Ambulatory Visit: Payer: Self-pay | Admitting: Internal Medicine

## 2022-02-21 ENCOUNTER — Telehealth: Payer: Self-pay

## 2022-02-21 NOTE — Telephone Encounter (Signed)
Patient Advocate Encounter  Prior Authorization for Budesonide 0.'5MG'$ /2ML suspension has been approved.    PA# [id number] Effective dates: 02/21/22 through 01/29/23  Approved under Medicare Part B

## 2022-02-21 NOTE — Telephone Encounter (Signed)
Noted  

## 2022-02-26 ENCOUNTER — Other Ambulatory Visit: Payer: PPO | Admitting: Pharmacist

## 2022-03-13 ENCOUNTER — Other Ambulatory Visit: Payer: PPO | Admitting: Pharmacist

## 2022-03-13 ENCOUNTER — Telehealth: Payer: Self-pay

## 2022-03-13 NOTE — Progress Notes (Signed)
03/13/2022 Name: Terry [name] MRN: [id number] DOB: October 12, 1933  Chief Complaint  Patient presents with   Medication Management   Hypertension    Terry [name] is a 89 y.o. year old male who presented for a telephone visit.   They were referred to the pharmacist by their PCP for assistance in managing medication access.  Subjective:  Care Team: Primary Care Provider: [name], MD ; Next Scheduled Visit: 07/13/22  Medication Access/Adherence  [id number] [place] [id number]: [phone]: [phone]  Patient reports affordability concerns with their medications: No  Patient reports access/transportation concerns to their pharmacy: No  Patient reports adherence concerns with their medications:  No    Denies issues with obtaining nebulizer medications from the pharmacy   Hypertension:  Current medications: amlodipine 10 mg daily, metoprolol succinate 50 mg daily, telmisartan 80 mg daily  Patient does not have a validated, automated, upper arm home BP cuff  Patient denies hypotensive s/sx including dizziness, lightheadedness.  Patient denies hypertensive symptoms including headache, chest pain, shortness of breath  COPD:  Current medications: ipratropium/albuterol nebulizer; budesonide nebulizer- reports use of both agents PRN has improved breathing; reports he has mostly recovered from infection last month, is working on getting strength back  Objective:  Lab Results  Component Value Date   HGBA1C 6.6 (H) 08/09/2021    Lab Results  Component Value Date   CREATININE 1.29 08/09/2021   BUN 17 08/09/2021   NA 138 08/09/2021   K 4.3 08/09/2021   CL 102 08/09/2021   CO2 27 08/09/2021    Lab Results  Component Value Date   CHOL 212 (H) 08/09/2021   HDL 89 08/09/2021   LDLCALC 101 (H) 08/09/2021   LDLDIRECT 49.0 03/12/2016   TRIG 127 08/09/2021   CHOLHDL  2.4 08/09/2021    Medications Reviewed Today     Reviewed by HOsker Mason RPH-CPP (Pharmacist) on 03/13/22 at 1444  Med List Status: <None>   Medication Order Taking? Sig Documenting Provider Last Dose Status Informant  albuterol (VENTOLIN HFA) 108 (90 Base) MCG/ACT inhaler [id number] Inhale 2 puffs into the lungs every 6 (six) hours as needed for wheezing or shortness of breath.  Patient not taking: Reported on 03/13/2022   TCrecencio Mc MD Not Taking Active   amLODipine (NORVASC) 10 MG tablet [id number] Take 1 tablet (10 mg total) by mouth daily. TCrecencio Mc MD Taking Active   aspirin EC 81 MG tablet [id number] Take 1 tablet (81 mg total) by mouth daily. AWellington Hampshire MD Taking Active   b complex vitamins tablet [id number] Take 1 tablet by mouth daily. [provider] Taking Active   budesonide (PULMICORT) 0.5 MG/2ML nebulizer solution [id number] Take 2 mLs (0.5 mg total) by nebulization daily. TCrecencio Mc MD Taking Active   cyanocobalamin 1000 MCG tablet [id number] Take 1,000 mcg by mouth daily. [provider] Taking Active   ipratropium-albuterol (DUONEB) 0.5-2.5 (3) MG/3ML nebulizer solution 3 mL [id number]  ABurnard Hawthorne FNP  Active   ipratropium-albuterol (DUONEB) 0.5-2.5 (3) MG/3ML SOLN [id number] Take 3 mLs by nebulization every 6 (six) hours as needed. TCrecencio Mc MD Taking Active   methimazole (TAPAZOLE) 5 MG tablet [id number] TAKE 1 TABLET BY MOUTH EVERY DAY TCrecencio Mc MD Taking Active   metoprolol succinate (TOPROL-XL) 50 MG 24 hr tablet [id number]  TAKE 1 TABLET BY MOUTH EVERY DAY [name], MD Taking Active   omeprazole (PRILOSEC) 40 MG capsule [id number] No TAKE 1 CAPSULE BY MOUTH EVERY DAY  Patient not taking: Reported on 03/13/2022   Leone Haven, MD Not Taking Active   tadalafil (CIALIS) 5 MG tablet [id number]  Take 1 tablet (5 mg total) by mouth daily as needed for erectile  dysfunction. [name], MD  Active   telmisartan (MICARDIS) 80 MG tablet [id number] Yes TAKE 1 TABLET BY MOUTH EVERYDAY AT BEDTIME [name], MD Taking Active   Med List Note [name], [name], MD 04/12/19 1206): am              Assessment/Plan:   Hypertension: - Currently controlled - Encouraged to consider purchasing home BP cuff. Encouraged to check if [employer] has an over the counter benefits program; if not, recommend Omron Series 3 Upper Arm BP cuff. He verbalized understanding - Recommend to continue current regimen at this time    COPD: - Currently controlled.  - Recommend to continue current regimen at this time    Follow Up Plan: pharmacy needs met. Patient aware to contact with any future needs.   [name] Hedwig Morton, PharmD, [place], [facility] [phone]

## 2022-03-13 NOTE — Telephone Encounter (Signed)
Requested most recent DM eye exam

## 2022-03-13 NOTE — Patient Instructions (Signed)
Mr Derbyshire,   It was great talking to you today!  Please call the customer service number on the back of your insurance card to see if you have "Over the Counter" benefits. If so, you may be able to use these benefits to get a good quality blood pressure machine.   If not, please consider purchasing a good quality blood pressure machine, such as an Omron-brand upper arm cuff. This can be purchased from any pharmacy, including our [facility] outpatient pharmacies, for ~$28-$30. We recommend a blood pressure cuff that goes around your upper arm, as these are generally the most accurate.   Check your blood pressure periodically, and any time you have concerning symptoms like headache, chest pain, dizziness, shortness of breath, or vision changes.   Our goal is less than 140/90.  To appropriately check your blood pressure, make sure you do the following:  1) Avoid caffeine, exercise, or tobacco products for 30 minutes before checking. Empty your bladder. 2) Sit with your back supported in a flat-backed chair. Rest your arm on something flat (arm of the chair, table, etc). 3) Sit still with your feet flat on the floor, resting, for at least 5 minutes.  4) Check your blood pressure. Take 1-2 readings.  5) Write down these readings and bring with you to any provider appointments.  Bring your home blood pressure machine with you to a provider's office for accuracy comparison at least once a year.   Make sure you take your blood pressure medications before you come to any office visit, even if you were asked to fast for labs.   Take care!  [name] Hedwig Morton, PharmD, [place], [facility] [phone]

## 2022-04-27 ENCOUNTER — Telehealth: Payer: Self-pay

## 2022-04-27 NOTE — Patient Outreach (Signed)
  Care Coordination   Initial Visit Note   04/27/2022 Name: AGAMVEER DANNEMILLER MRN: [id number] DOB: [birth year]  PAZ BOULAIS is a 89 y.o. year old male who sees [name], [name], MD for primary care. I spoke with  Herma Ard by phone today.  What matters to the patients health and wellness today?  Patient denies having any nursing or community resource needs at this time.     Goals Addressed             This Visit's Progress    care coordination activities - no further follow up needed.       Interventions Today    Flowsheet Row Most Recent Value  General Interventions   General Interventions Discussed/Reviewed General Interventions Discussed  [Care coordination program/ services discussed. SDOH survey completed. AWV discussed and patient advised to contact provider to schedule.  Advised to contact primary provider office if care coordination services needed in the future.]              SDOH assessments and interventions completed:  Yes  SDOH Interventions Today    Flowsheet Row Most Recent Value  SDOH Interventions   Food Insecurity Interventions Intervention Not Indicated  Housing Interventions Intervention Not Indicated  Transportation Interventions Intervention Not Indicated        Care Coordination Interventions:  Yes, provided   Follow up plan: No further intervention required.   Encounter Outcome:  Pt. Visit Completed   Quinn Plowman RN,BSN,CCM [place] [phone] direct line

## 2022-05-21 ENCOUNTER — Other Ambulatory Visit: Payer: Self-pay | Admitting: Internal Medicine

## 2022-06-13 ENCOUNTER — Telehealth: Payer: Self-pay | Admitting: Internal Medicine

## 2022-06-13 NOTE — Telephone Encounter (Signed)
Copied from CRM [id number]. Topic: Medicare AWV >> Jun 13, 2022 11:37 AM Payton Doughty wrote: Reason for CRM: Called patient to schedule Medicare Annual Wellness Visit (AWV). Left message for patient to call back and schedule Medicare Annual Wellness Visit (AWV).  Last date of AWV: 07/11/21  Please schedule an appointment at any time with Sydell Axon, CMA  .  If any questions, please contact me.  Thank you ,  Verlee Rossetti; Care Guide Ambulatory Clinical Support [place] [facility] Direct Dial: [phone]

## 2022-06-13 NOTE — Telephone Encounter (Signed)
Copied from CRM [id number]. Topic: Medicare AWV >> Jun 13, 2022 11:38 AM Payton Doughty wrote: Reason for CRM: Called patient to schedule Medicare Annual Wellness Visit (AWV). Left message for patient to call back and schedule Medicare Annual Wellness Visit (AWV).  Last date of AWV: 07/10/21  Please schedule an appointment at any time with Sydell Axon, CMA  .  If any questions, please contact me.  Thank you ,  Verlee Rossetti; Care Guide Ambulatory Clinical Support [place] [facility] Direct Dial: [phone]

## 2022-06-14 ENCOUNTER — Telehealth: Payer: Self-pay | Admitting: Internal Medicine

## 2022-06-14 NOTE — Telephone Encounter (Signed)
Contacted Terry [name] to schedule their annual wellness visit. Appointment made for 07/23/2022.  Verlee Rossetti; Care Guide Ambulatory Clinical Support [place] [facility] Direct Dial: [phone]

## 2022-07-09 DIAGNOSIS — L570 Actinic keratosis: Secondary | ICD-10-CM | POA: Diagnosis not present

## 2022-07-09 DIAGNOSIS — D485 Neoplasm of uncertain behavior of skin: Secondary | ICD-10-CM | POA: Diagnosis not present

## 2022-07-09 DIAGNOSIS — D044 Carcinoma in situ of skin of scalp and neck: Secondary | ICD-10-CM | POA: Diagnosis not present

## 2022-07-09 DIAGNOSIS — D2262 Melanocytic nevi of left upper limb, including shoulder: Secondary | ICD-10-CM | POA: Diagnosis not present

## 2022-07-09 DIAGNOSIS — D2272 Melanocytic nevi of left lower limb, including hip: Secondary | ICD-10-CM | POA: Diagnosis not present

## 2022-07-09 DIAGNOSIS — D2261 Melanocytic nevi of right upper limb, including shoulder: Secondary | ICD-10-CM | POA: Diagnosis not present

## 2022-07-09 DIAGNOSIS — Z85828 Personal history of other malignant neoplasm of skin: Secondary | ICD-10-CM | POA: Diagnosis not present

## 2022-07-13 ENCOUNTER — Ambulatory Visit: Payer: PPO

## 2022-07-23 ENCOUNTER — Ambulatory Visit (INDEPENDENT_AMBULATORY_CARE_PROVIDER_SITE_OTHER): Payer: PPO | Admitting: *Deleted

## 2022-07-23 VITALS — Ht 73.0 in | Wt 160.0 lb

## 2022-07-23 DIAGNOSIS — Z0000 Encounter for general adult medical examination without abnormal findings: Secondary | ICD-10-CM | POA: Diagnosis not present

## 2022-07-23 NOTE — Progress Notes (Cosign Needed Addendum)
Subjective:   Terry [name] is a 89 y.o. male who presents for Medicare Annual/Subsequent preventive examination.  Visit Complete: Virtual  I connected with  [name] on 07/24/22 by a audio enabled telemedicine application and verified that I am speaking with the correct person using two identifiers.  Patient Location: Home  Provider Location: Office/Clinic  I discussed the limitations of evaluation and management by telemedicine. The patient expressed understanding and agreed to proceed.    Review of Systems     Cardiac Risk Factors include: advanced age (>1men, >44 women);diabetes mellitus;dyslipidemia;hypertension     Objective:    Today's Vitals   07/23/22 1501  Weight: 160 lb (72.6 kg)  Height: 6\' 1"  (1.854 m)   Body mass index is 21.11 kg/m.     07/23/2022    3:17 PM 07/11/2021    2:00 PM 03/23/2020    9:14 AM 03/23/2019    9:15 AM 03/18/2018   12:18 PM 11/06/2016    3:22 PM 09/19/2016    7:56 AM  Advanced Directives  Does Patient Have a Medical Advance Directive? Yes Yes Yes Yes Yes Yes No  Type of Estate agent of [place];Living will Out of facility DNR (pink MOST or yellow form)   Living will Healthcare Power of [place];Living will   Does patient want to make changes to medical advance directive?   No - Patient declined No - Patient declined No - Patient declined No - Patient declined   Copy of Healthcare Power of Attorney in Chart? No - copy requested     No - copy requested   Would patient like information on creating a medical advance directive?       No - Patient declined    Current Medications (verified) Outpatient Encounter Medications as of 07/23/2022  Medication Sig   albuterol (VENTOLIN HFA) 108 (90 Base) MCG/ACT inhaler Inhale 2 puffs into the lungs every 6 (six) hours as needed for wheezing or shortness of breath.   amLODipine (NORVASC) 10 MG tablet Take 1 tablet (10 mg total) by mouth daily.   aspirin EC 81 MG tablet  Take 1 tablet (81 mg total) by mouth daily.   b complex vitamins tablet Take 1 tablet by mouth daily.   budesonide (PULMICORT) 0.5 MG/2ML nebulizer solution Take 2 mLs (0.5 mg total) by nebulization daily.   cyanocobalamin 1000 MCG tablet Take 1,000 mcg by mouth daily.   ipratropium-albuterol (DUONEB) 0.5-2.5 (3) MG/3ML SOLN Take 3 mLs by nebulization every 6 (six) hours as needed.   methimazole (TAPAZOLE) 5 MG tablet TAKE 1 TABLET BY MOUTH EVERY DAY   metoprolol succinate (TOPROL-XL) 50 MG 24 hr tablet TAKE 1 TABLET BY MOUTH EVERY DAY   omeprazole (PRILOSEC) 40 MG capsule TAKE 1 CAPSULE BY MOUTH EVERY DAY   telmisartan (MICARDIS) 80 MG tablet TAKE 1 TABLET BY MOUTH EVERYDAY AT BEDTIME   tadalafil (CIALIS) 5 MG tablet Take 1 tablet (5 mg total) by mouth daily as needed for erectile dysfunction. (Patient not taking: Reported on 07/23/2022)   Facility-Administered Encounter Medications as of 07/23/2022  Medication   ipratropium-albuterol (DUONEB) 0.5-2.5 (3) MG/3ML nebulizer solution 3 mL    Allergies (verified) Patient has no known allergies.   History: Past Medical History:  Diagnosis Date   Anemia    COPD (chronic obstructive pulmonary disease) (HCC)    Diabetes mellitus 2008   GERD (gastroesophageal reflux disease)    Hyperlipidemia    Hypertension    hyperthyroidism  Squamous cell carcinoma of skin of left upper arm November 2015   Excised to negative margins, associated keratoacanthoma.   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 Niel Hummer. He is due for followup colonoscopy    Past Surgical History:  Procedure Laterality Date   APPENDECTOMY  1970   ARM SKIN LESION BIOPSY / EXCISION Left 2016   CATARACT EXTRACTION, BILATERAL  2012   [facility]   COLONOSCOPY  12-10-12   COLONOSCOPY W/ POLYPECTOMY  2011   Dr. Drue Stager   COLONOSCOPY WITH PROPOFOL N/A 09/19/2016   Procedure: COLONOSCOPY WITH PROPOFOL;  Surgeon: Earline Mayotte, MD;  Location:  [facility] ENDOSCOPY;  Service: Endoscopy;  Laterality: N/A;   ESOPHAGOGASTRODUODENOSCOPY (EGD) WITH PROPOFOL N/A 09/19/2016   Procedure: ESOPHAGOGASTRODUODENOSCOPY (EGD) WITH PROPOFOL;  Surgeon: Earline Mayotte, MD;  Location: [facility] ENDOSCOPY;  Service: Endoscopy;  Laterality: N/A;   Family History  Problem Relation Age of Onset   Hypertension Father    Diabetes Maternal Aunt    Hyperlipidemia Maternal Uncle    Birth defects Neg Hx    Social History   Socioeconomic History   Marital status: Married    Spouse name: Not on file   Number of children: Not on file   Years of education: Not on file   Highest education level: Not on file  Occupational History   Not on file  Tobacco Use   Smoking status: Former    Packs/day: 2.00    Years: 50.00    Additional pack years: 0.00    Total pack years: 100.00    Types: Cigarettes    Quit date: 06/26/1999    Years since quitting: 23.0   Smokeless tobacco: Never  Vaping Use   Vaping Use: Never used  Substance and Sexual Activity   Alcohol use: Not Currently   Drug use: No   Sexual activity: Yes  Other Topics Concern   Not on file  Social History Narrative   Not on file   Social Determinants of Health   Financial Resource Strain: Low Risk  (07/24/2022)   Overall Financial Resource Strain (CARDIA)    Difficulty of Paying Living Expenses: Not hard at all  Food Insecurity: No Food Insecurity (07/24/2022)   Hunger Vital Sign    Worried About Running Out of Food in the Last Year: Never true    Ran Out of Food in the Last Year: Never true  Transportation Needs: No Transportation Needs (07/24/2022)   PRAPARE - Administrator, Civil Service (Medical): No    Lack of Transportation (Non-Medical): No  Physical Activity: Insufficiently Active (07/24/2022)   Exercise Vital Sign    Days of Exercise per Week: 5 days    Minutes of Exercise per Session: 20 min  Stress: No Stress Concern Present (07/24/2022)   [employer] of  Occupational Health - Occupational Stress Questionnaire    Feeling of Stress : Not at all  Social Connections: Socially Integrated (07/24/2022)   Social Connection and Isolation Panel [NHANES]    Frequency of Communication with Friends and Family: More than three times a week    Frequency of Social Gatherings with Friends and Family: More than three times a week    Attends Religious Services: More than 4 times per year    Active Member of [employer] or Organizations: Yes    Attends Engineer, structural: More than 4 times per year    Marital Status: Married    Tobacco Counseling Counseling  given: Not Answered   Clinical Intake:  Pre-visit preparation completed: Yes  Pain : No/denies pain     BMI - recorded: 21.11 Nutritional Status: BMI of 19-24  Normal Nutritional Risks: None Diabetes: Yes CBG done?: No Did pt. bring in CBG monitor from home?: No  How often do you need to have someone help you when you read instructions, pamphlets, or other written materials from your doctor or pharmacy?: 1 - Never  Interpreter Needed?: No  Information entered by :: R. Mateo Overbeck LPN   Activities of Daily Living    07/23/2022    3:14 PM  In your present state of health, do you have any difficulty performing the following activities:  Hearing? 0  Vision? 0  Difficulty concentrating or making decisions? 0  Walking or climbing stairs? 0  Dressing or bathing? 0  Doing errands, shopping? 0  Preparing Food and eating ? N  Using the Toilet? N  In the past six months, have you accidently leaked urine? N  Do you have problems with loss of bowel control? N  Managing your Medications? N  Managing your Finances? N  Housekeeping or managing your Housekeeping? N    Patient Care Team: Sherlene Shams, MD as PCP - General (Internal Medicine) Lemar Livings Merrily Pew, MD (General Surgery) Sherlene Shams, MD (Internal Medicine) Iran Ouch, MD as Consulting Physician (Cardiology) Alden Hipp, RPH-CPP (Pharmacist)  Indicate any recent Medical Services you may have received from other than [facility] providers in the past year (date may be approximate).     Assessment:   This is a routine wellness examination for [place].  Hearing/Vision screen Hearing Screening - Comments:: No issues, no aids Vision Screening - Comments:: Glasses  Dietary issues and exercise activities discussed:     Goals Addressed             This Visit's Progress    Patient Stated       Continue to walk with dog daily       Depression Screen    07/23/2022    3:20 PM 02/02/2022    4:53 PM 11/10/2021    3:11 PM 08/09/2021    3:50 PM 07/11/2021    1:57 PM 11/02/2020    1:41 PM 03/23/2020    9:13 AM  PHQ 2/9 Scores  PHQ - 2 Score 0 0 0 0 0 0 0  PHQ- 9 Score 0          Fall Risk    07/23/2022    3:15 PM 02/02/2022    4:53 PM 11/10/2021    3:11 PM 08/09/2021    3:50 PM 07/11/2021    2:03 PM  Fall Risk   Falls in the past year? 0 0 0 0 0  Number falls in past yr: 0      Injury with Fall? 0      Risk for fall due to : No Fall Risks No Fall Risks No Fall Risks No Fall Risks   Follow up Falls evaluation completed;Falls prevention discussed Falls evaluation completed Falls evaluation completed Falls evaluation completed Falls evaluation completed    MEDICARE RISK AT HOME:  Medicare Risk at Home - 07/23/22 1516     Any stairs in or around the home? No    If so, are there any without handrails? No    Home free of loose throw rugs in walkways, pet beds, electrical cords, etc? Yes    Adequate lighting in your home to reduce  risk of falls? Yes    Life alert? No    Use of a cane, walker or w/c? No    Grab bars in the bathroom? Yes    Shower chair or bench in shower? No    Elevated toilet seat or a handicapped toilet? Yes             T Cognitive Function:    11/06/2016    3:36 PM  MMSE - Mini Mental State Exam  Orientation to time 5  Orientation to Place 5  Registration 3   Attention/ Calculation 5  Recall 3  Language- name 2 objects 2  Language- repeat 1  Language- follow 3 step command 3  Language- read & follow direction 1  Write a sentence 1  Copy design 1  Total score 30        07/23/2022    3:18 PM 03/23/2019    9:27 AM 03/18/2018   12:28 PM  6CIT Screen  What Year? 0 points 0 points 0 points  What month? 0 points 0 points 0 points  What time? 0 points 0 points 0 points  Count back from 20 2 points  0 points  Months in reverse 0 points 0 points 0 points  Repeat phrase 0 points  0 points  Total Score 2 points  0 points    Immunizations Immunization History  Administered Date(s) Administered   Fluad Quad(high Dose 65+) 12/23/2018, 12/17/2019, 11/02/2020, 11/10/2021   Influenza, High Dose Seasonal PF 01/17/2015, 11/06/2016, 10/08/2017   Influenza,inj,Quad PF,6+ Mos 10/02/2012, 11/12/2013   Influenza-Unspecified 10/30/2015   PFIZER(Purple Top)SARS-COV-2 Vaccination 03/27/2019, 04/22/2019, 11/30/2019   PNEUMOCOCCAL CONJUGATE-20 11/02/2020   Pneumococcal Conjugate-13 11/12/2013   Pneumococcal Polysaccharide-23 10/02/2012   Tdap 02/25/2013    TDAP status: Up to date  Flu Vaccine status: Up to date  Pneumococcal vaccine status: Up to date  Covid-19 vaccine status: Information provided on how to obtain vaccines.   Qualifies for Shingles Vaccine? Yes   Zostavax completed No   Shingrix Completed?: No.    Education has been provided regarding the importance of this vaccine. Patient has been advised to call insurance company to determine out of pocket expense if they have not yet received this vaccine. Advised may also receive vaccine at local pharmacy or Health Dept. Verbalized acceptance and understanding.  Screening Tests Health Maintenance  Topic Date Due   COVID-19 Vaccine (4 - 2023-24 season) 09/29/2021   OPHTHALMOLOGY EXAM  10/24/2021   HEMOGLOBIN A1C  02/09/2022   INFLUENZA VACCINE  08/30/2022   FOOT EXAM  11/11/2022    DTaP/Tdap/Td (2 - Td or Tdap) 02/26/2023   Medicare Annual Wellness (AWV)  07/23/2023   Pneumonia Vaccine 87+ Years old  Completed   HPV VACCINES  Aged Out   Zoster Vaccines- Shingrix  Discontinued    Health Maintenance  Health Maintenance Due  Topic Date Due   COVID-19 Vaccine (4 - 2023-24 season) 09/29/2021   OPHTHALMOLOGY EXAM  10/24/2021   HEMOGLOBIN A1C  02/09/2022      Lung Cancer Screening: (Low Dose CT Chest recommended if Age 49-80 years, 20 pack-year currently smoking OR have quit w/in 15years.) does not qualify.    Additional Screening:  Hepatitis C Screening: does not qualify;   Vision Screening: Recommended annual ophthalmology exams for early detection of glaucoma and other disorders of the eye. Is the patient up to date with their annual eye exam?  Yes  Who is the provider or what is the name of the  office in which the patient attends annual eye exams? [facility] plans on changing If pt is not established with a provider, would they like to be referred to a provider to establish care? No .   Dental Screening: Recommended annual dental exams for proper oral hygiene  Diabetic Foot Exam: Diabetic Foot Exam: Completed 10/2 3  Community Resource Referral / Chronic Care Management: CRR required this visit?    CCM required this visit?  No     Plan:     I have personally reviewed and noted the following in the patient's chart:   Medical and social history Use of alcohol, tobacco or illicit drugs  Current medications and supplements including opioid prescriptions. Patient is not currently taking opioid prescriptions. Functional ability and status Nutritional status Physical activity Advanced directives List of other physicians Hospitalizations, surgeries, and ER visits in previous 12 months Vitals Screenings to include cognitive, depression, and falls Referrals and appointments  In addition, I have reviewed and discussed with patient certain  preventive protocols, quality metrics, and best practice recommendations. A written personalized care plan for preventive services as well as general preventive health recommendations were provided to patient.     Sydell Axon, LPN   9/56/2130   After Visit Summary: (MyChart) Due to this being a telephonic visit, the after visit summary with patients personalized plan was offered to patient via MyChart   Nurse Notes: Plans on changing from [facility] when next appointment is due. Patient was advised to schedule an eye exam.   I have reviewed the above information and agree with above.   Duncan Dull, MD

## 2022-07-23 NOTE — Patient Instructions (Signed)
Mr. Barna , Thank you for taking time to come for your Medicare Wellness Visit. I appreciate your ongoing commitment to your health goals. Please review the following plan we discussed and let me know if I can assist you in the future.   These are the goals we discussed:  Goals       care coordination activities - no further follow up needed.      Interventions Today    Flowsheet Row Most Recent Value  General Interventions   General Interventions Discussed/Reviewed General Interventions Discussed  [Care coordination program/ services discussed. SDOH survey completed. AWV discussed and patient advised to contact provider to schedule.  Advised to contact primary provider office if care coordination services needed in the future.]            Maintain weight (pt-stated)      Healthy diet Stay active      Patient Stated      Continue to walk with dog daily        Mr. Roderic Scarce , Thank you for taking time to come for your Medicare Wellness Visit. I appreciate your ongoing commitment to your health goals. Please review the following plan we discussed and let me know if I can assist you in the future.   These are the goals we discussed:  Goals       care coordination activities - no further follow up needed.      Interventions Today    Flowsheet Row Most Recent Value  General Interventions   General Interventions Discussed/Reviewed General Interventions Discussed  [Care coordination program/ services discussed. SDOH survey completed. AWV discussed and patient advised to contact provider to schedule.  Advised to contact primary provider office if care coordination services needed in the future.]            Maintain weight (pt-stated)      Healthy diet Stay active      Patient Stated      Continue to walk with dog daily        This is a list of the screening recommended for you and due dates:  Health Maintenance  Topic Date Due   COVID-19 Vaccine (4 - 2023-24 season)  09/29/2021   Eye exam for diabetics  10/24/2021   Hemoglobin A1C  02/09/2022   Medicare Annual Wellness Visit  07/12/2022   Flu Shot  08/30/2022   Complete foot exam   11/11/2022   DTaP/Tdap/Td vaccine (2 - Td or Tdap) 02/26/2023   Pneumonia Vaccine  Completed   HPV Vaccine  Aged Out   Zoster (Shingles) Vaccine  Discontinued    Advanced directives: in file   Conditions/risks identified: none  Next appointment: Follow up in one year for your annual wellness visit. 07/24/23 @ 3:00 telephone  Preventive Care 65 Years and Older, Male  Preventive care refers to lifestyle choices and visits with your health care provider that can promote health and wellness. What does preventive care include? A yearly physical exam. This is also called an annual well check. Dental exams once or twice a year. Routine eye exams. Ask your health care provider how often you should have your eyes checked. Personal lifestyle choices, including: Daily care of your teeth and gums. Regular physical activity. Eating a healthy diet. Avoiding tobacco and drug use. Limiting alcohol use. Practicing safe sex. Taking low doses of aspirin every day. Taking vitamin and mineral supplements as recommended by your health care provider. What happens during an annual well check?  The services and screenings done by your health care provider during your annual well check will depend on your age, overall health, lifestyle risk factors, and family history of disease. Counseling  Your health care provider may ask you questions about your: Alcohol use. Tobacco use. Drug use. Emotional well-being. Home and relationship well-being. Sexual activity. Eating habits. History of falls. Memory and ability to understand (cognition). Work and work Astronomer. Screening  You may have the following tests or measurements: Height, weight, and BMI. Blood pressure. Lipid and cholesterol levels. These may be checked every 5 years,  or more frequently if you are over 88 years old. Skin check. Lung cancer screening. You may have this screening every year starting at age 107 if you have a 30-pack-year history of smoking and currently smoke or have quit within the past 15 years. Fecal occult blood test (FOBT) of the stool. You may have this test every year starting at age 41. Flexible sigmoidoscopy or colonoscopy. You may have a sigmoidoscopy every 5 years or a colonoscopy every 10 years starting at age 62. Prostate cancer screening. Recommendations will vary depending on your family history and other risks. Hepatitis C blood test. Hepatitis B blood test. Sexually transmitted disease (STD) testing. Diabetes screening. This is done by checking your blood sugar (glucose) after you have not eaten for a while (fasting). You may have this done every 1-3 years. Abdominal aortic aneurysm (AAA) screening. You may need this if you are a current or former smoker. Osteoporosis. You may be screened starting at age 84 if you are at high risk. Talk with your health care provider about your test results, treatment options, and if necessary, the need for more tests. Vaccines  Your health care provider may recommend certain vaccines, such as: Influenza vaccine. This is recommended every year. Tetanus, diphtheria, and acellular pertussis (Tdap, Td) vaccine. You may need a Td booster every 10 years. Zoster vaccine. You may need this after age 40. Pneumococcal 13-valent conjugate (PCV13) vaccine. One dose is recommended after age 71. Pneumococcal polysaccharide (PPSV23) vaccine. One dose is recommended after age 60. Talk to your health care provider about which screenings and vaccines you need and how often you need them. This information is not intended to replace advice given to you by your health care provider. Make sure you discuss any questions you have with your health care provider. Document Released: 02/11/2015 Document Revised:  10/05/2015 Document Reviewed: 11/16/2014 Elsevier Interactive Patient Education  2017 [employer].  Fall Prevention in the Home Falls can cause injuries. They can happen to people of all ages. There are many things you can do to make your home safe and to help prevent falls. What can I do on the outside of my home? Regularly fix the edges of walkways and driveways and fix any cracks. Remove anything that might make you trip as you walk through a door, such as a raised step or threshold. Trim any bushes or trees on the path to your home. Use bright outdoor lighting. Clear any walking paths of anything that might make someone trip, such as rocks or tools. Regularly check to see if handrails are loose or broken. Make sure that both sides of any steps have handrails. Any raised decks and porches should have guardrails on the edges. Have any leaves, snow, or ice cleared regularly. Use sand or salt on walking paths during winter. Clean up any spills in your garage right away. This includes oil or grease spills. What can I  do in the bathroom? Use night lights. Install grab bars by the toilet and in the tub and shower. Do not use towel bars as grab bars. Use non-skid mats or decals in the tub or shower. If you need to sit down in the shower, use a plastic, non-slip stool. Keep the floor dry. Clean up any water that spills on the floor as soon as it happens. Remove soap buildup in the tub or shower regularly. Attach bath mats securely with double-sided non-slip rug tape. Do not have throw rugs and other things on the floor that can make you trip. What can I do in the bedroom? Use night lights. Make sure that you have a light by your bed that is easy to reach. Do not use any sheets or blankets that are too big for your bed. They should not hang down onto the floor. Have a firm chair that has side arms. You can use this for support while you get dressed. Do not have throw rugs and other things  on the floor that can make you trip. What can I do in the kitchen? Clean up any spills right away. Avoid walking on wet floors. Keep items that you use a lot in easy-to-reach places. If you need to reach something above you, use a strong step stool that has a grab bar. Keep electrical cords out of the way. Do not use floor polish or wax that makes floors slippery. If you must use wax, use non-skid floor wax. Do not have throw rugs and other things on the floor that can make you trip. What can I do with my stairs? Do not leave any items on the stairs. Make sure that there are handrails on both sides of the stairs and use them. Fix handrails that are broken or loose. Make sure that handrails are as long as the stairways. Check any carpeting to make sure that it is firmly attached to the stairs. Fix any carpet that is loose or worn. Avoid having throw rugs at the top or bottom of the stairs. If you do have throw rugs, attach them to the floor with carpet tape. Make sure that you have a light switch at the top of the stairs and the bottom of the stairs. If you do not have them, ask someone to add them for you. What else can I do to help prevent falls? Wear shoes that: Do not have high heels. Have rubber bottoms. Are comfortable and fit you well. Are closed at the toe. Do not wear sandals. If you use a stepladder: Make sure that it is fully opened. Do not climb a closed stepladder. Make sure that both sides of the stepladder are locked into place. Ask someone to hold it for you, if possible. Clearly mark and make sure that you can see: Any grab bars or handrails. First and last steps. Where the edge of each step is. Use tools that help you move around (mobility aids) if they are needed. These include: Canes. Walkers. Scooters. Crutches. Turn on the lights when you go into a dark area. Replace any light bulbs as soon as they burn out. Set up your furniture so you have a clear path. Avoid  moving your furniture around. If any of your floors are uneven, fix them. If there are any pets around you, be aware of where they are. Review your medicines with your doctor. Some medicines can make you feel dizzy. This can increase your chance of falling. Ask  your doctor what other things that you can do to help prevent falls. This information is not intended to replace advice given to you by your health care provider. Make sure you discuss any questions you have with your health care provider. Document Released: 11/11/2008 Document Revised: 06/23/2015 Document Reviewed: 02/19/2014 Elsevier Interactive Patient Education  2017 [employer].

## 2022-08-09 DIAGNOSIS — D044 Carcinoma in situ of skin of scalp and neck: Secondary | ICD-10-CM | POA: Diagnosis not present

## 2022-08-18 ENCOUNTER — Other Ambulatory Visit: Payer: Self-pay | Admitting: Internal Medicine

## 2022-08-23 ENCOUNTER — Encounter: Payer: Self-pay | Admitting: Internal Medicine

## 2022-08-23 ENCOUNTER — Ambulatory Visit: Payer: PPO | Admitting: Internal Medicine

## 2022-08-23 VITALS — BP 132/56 | HR 79 | Temp 97.7°F | Ht 73.0 in | Wt 155.8 lb

## 2022-08-23 DIAGNOSIS — E1129 Type 2 diabetes mellitus with other diabetic kidney complication: Secondary | ICD-10-CM | POA: Diagnosis not present

## 2022-08-23 DIAGNOSIS — N183 Chronic kidney disease, stage 3 unspecified: Secondary | ICD-10-CM

## 2022-08-23 DIAGNOSIS — E1122 Type 2 diabetes mellitus with diabetic chronic kidney disease: Secondary | ICD-10-CM | POA: Diagnosis not present

## 2022-08-23 DIAGNOSIS — J449 Chronic obstructive pulmonary disease, unspecified: Secondary | ICD-10-CM

## 2022-08-23 DIAGNOSIS — I150 Renovascular hypertension: Secondary | ICD-10-CM

## 2022-08-23 DIAGNOSIS — I2089 Other forms of angina pectoris: Secondary | ICD-10-CM | POA: Diagnosis not present

## 2022-08-23 DIAGNOSIS — R944 Abnormal results of kidney function studies: Secondary | ICD-10-CM

## 2022-08-23 DIAGNOSIS — E785 Hyperlipidemia, unspecified: Secondary | ICD-10-CM

## 2022-08-23 DIAGNOSIS — D649 Anemia, unspecified: Secondary | ICD-10-CM

## 2022-08-23 DIAGNOSIS — R809 Proteinuria, unspecified: Secondary | ICD-10-CM

## 2022-08-23 LAB — LIPID PANEL
Cholesterol: 182 mg/dL (ref 0–200)
HDL: 62.8 mg/dL (ref >39.00)
LDL Cholesterol: 99 mg/dL (ref 0–99)
NonHDL: 119.4
Total CHOL/HDL Ratio: 3
Triglycerides: 100 mg/dL (ref 0.0–149.0)
VLDL: 20 mg/dL (ref 0.0–40.0)

## 2022-08-23 LAB — COMPREHENSIVE METABOLIC PANEL
ALT: 7 U/L (ref 0–53)
AST: 15 U/L (ref 0–37)
Albumin: 3.7 g/dL (ref 3.5–5.2)
Alkaline Phosphatase: 50 U/L (ref 39–117)
BUN: 25 mg/dL — ABNORMAL HIGH (ref 6–23)
CO2: 28 mEq/L (ref 19–32)
Calcium: 9.2 mg/dL (ref 8.4–10.5)
Chloride: 102 mEq/L (ref 96–112)
Creatinine, Ser: 1.63 mg/dL — ABNORMAL HIGH (ref 0.40–1.50)
GFR: 37.64 mL/min — ABNORMAL LOW (ref >60.00)
Glucose, Bld: 259 mg/dL — ABNORMAL HIGH (ref 70–99)
Potassium: 4.3 mEq/L (ref 3.5–5.1)
Sodium: 136 mEq/L (ref 135–145)
Total Bilirubin: 0.5 mg/dL (ref 0.2–1.2)
Total Protein: 6.3 g/dL (ref 6.0–8.3)

## 2022-08-23 LAB — LDL CHOLESTEROL, DIRECT: Direct LDL: 99 mg/dL

## 2022-08-23 LAB — HEMOGLOBIN A1C: Hgb A1c MFr Bld: 7.2 % — ABNORMAL HIGH (ref 4.6–6.5)

## 2022-08-23 MED ORDER — METHIMAZOLE 5 MG PO TABS: 5.0000 mg | ORAL_TABLET | Freq: Every day | ORAL | 1 refills | Status: DC

## 2022-08-23 MED ORDER — OMEPRAZOLE 40 MG PO CPDR: 40.0000 mg | DELAYED_RELEASE_CAPSULE | Freq: Every day | ORAL | 1 refills | Status: DC

## 2022-08-23 MED ORDER — IPRATROPIUM-ALBUTEROL 0.5-2.5 (3) MG/3ML IN SOLN: 3.0000 mL | Freq: Four times a day (QID) | RESPIRATORY_TRACT | 2 refills | Status: DC | PRN

## 2022-08-23 MED ORDER — AMLODIPINE BESYLATE 10 MG PO TABS: 10.0000 mg | ORAL_TABLET | Freq: Every day | ORAL | 1 refills | Status: DC

## 2022-08-23 MED ORDER — TELMISARTAN 80 MG PO TABS: ORAL_TABLET | ORAL | 1 refills | Status: DC

## 2022-08-23 NOTE — Progress Notes (Signed)
Subjective:  Patient ID: Terry [name], male    DOB: 11-12-1933  Age: 89 y.o. MRN: [id number]  CC: The primary encounter diagnosis was Anemia, unspecified type. Diagnoses of COPD, moderate (HCC), Type 2 DM with CKD stage 3 and hypertension (HCC), Hyperlipidemia with target LDL less than 70, Anginal equivalent, Microalbuminuria due to type 2 diabetes mellitus (HCC), Renovascular hypertension, and Decreased calculated glomerular filtration rate (GFR) were also pertinent to this visit.   HPI LARRIE FRAIZER presents for  Chief Complaint  Patient presents with   Medical Management of Chronic Issues   1) COPD: he reports no recent exacerbations, and denies dyspnea with daily activities.  Not walking or working anymore .Using Pulmicort and Duoneb via nebulizer due to shared cost  Concerned about his ability to care for Wife Bev as her cognitive function starts to decline  2)  Type 2 DM:  he  feels generally well,  But is not  exercising regularly or trying to lose weight. Checking  blood sugars less than once daily at variable times,  .  BS have been under 130 fasting and < 150 post prandially.  Denies any recent hypoglyemic events.  Diet controlled thus far , with carbohydrate modified diet . Denies numbness, burning and tingling of extremities. Appetite is good.    3) Hypertension: patient checks blood pressure weekly at home.  Readings have been for the most part <130/80 at rest . Patient is following a reduced salt diet most days and is taking  telmisartan and metoprolol  as prescribed    Outpatient Medications Prior to Visit  Medication Sig Dispense Refill   albuterol (VENTOLIN HFA) 108 (90 Base) MCG/ACT inhaler Inhale 2 puffs into the lungs every 6 (six) hours as needed for wheezing or shortness of breath. 3.7 g 11   aspirin EC 81 MG tablet Take 1 tablet (81 mg total) by mouth daily. 90 tablet 3   b complex vitamins tablet Take 1 tablet by mouth daily.     budesonide (PULMICORT) 0.5 MG/2ML  nebulizer solution Take 2 mLs (0.5 mg total) by nebulization daily. 60 mL 12   cyanocobalamin 1000 MCG tablet Take 1,000 mcg by mouth daily.     metoprolol succinate (TOPROL-XL) 50 MG 24 hr tablet TAKE 1 TABLET BY MOUTH EVERY DAY 90 tablet 1   tadalafil (CIALIS) 5 MG tablet Take 1 tablet (5 mg total) by mouth daily as needed for erectile dysfunction. 10 tablet 0   amLODipine (NORVASC) 10 MG tablet Take 1 tablet (10 mg total) by mouth daily. 90 tablet 1   ipratropium-albuterol (DUONEB) 0.5-2.5 (3) MG/3ML SOLN Take 3 mLs by nebulization every 6 (six) hours as needed. 360 mL 2   methimazole (TAPAZOLE) 5 MG tablet TAKE 1 TABLET BY MOUTH EVERY DAY 90 tablet 1   omeprazole (PRILOSEC) 40 MG capsule TAKE 1 CAPSULE BY MOUTH EVERY DAY 90 capsule 1   telmisartan (MICARDIS) 80 MG tablet TAKE 1 TABLET BY MOUTH EVERYDAY AT BEDTIME 90 tablet 1   Facility-Administered Medications Prior to Visit  Medication Dose Route Frequency Provider Last Rate Last Admin   ipratropium-albuterol (DUONEB) 0.5-2.5 (3) MG/3ML nebulizer solution 3 mL  3 mL Nebulization Q6H Allegra Grana, FNP        Review of Systems;  Patient denies headache, fevers, malaise, unintentional weight loss, skin rash, eye pain, sinus congestion and sinus pain, sore throat, dysphagia,  hemoptysis , cough, dyspnea, wheezing, chest pain, palpitations, orthopnea, edema, abdominal pain, nausea, melena, diarrhea, constipation,  flank pain, dysuria, hematuria, urinary  Frequency, nocturia, numbness, tingling, seizures,  Focal weakness, Loss of consciousness,  Tremor, insomnia, depression, anxiety, and suicidal ideation.      Objective:  BP (!) 132/56   Pulse 79   Temp 97.7 F (36.5 C) (Oral)   Ht 6\' 1"  (1.854 m)   Wt 155 lb 12.8 oz (70.7 kg)   SpO2 94%   BMI 20.56 kg/m   BP Readings from Last 3 Encounters:  08/23/22 (!) 132/56  02/02/22 130/80  11/10/21 (!) 160/84    Wt Readings from Last 3 Encounters:  08/23/22 155 lb 12.8 oz (70.7 kg)   07/23/22 160 lb (72.6 kg)  02/02/22 157 lb 3.2 oz (71.3 kg)    Physical Exam  Lab Results  Component Value Date   HGBA1C 7.2 (H) 08/23/2022   HGBA1C 6.6 (H) 08/09/2021   HGBA1C 6.8 (H) 11/02/2020    Lab Results  Component Value Date   CREATININE 1.63 (H) 08/23/2022   CREATININE 1.29 08/09/2021   CREATININE 1.33 11/02/2020    Lab Results  Component Value Date   WBC 7.1 08/09/2021   HGB 12.7 (L) 08/09/2021   HCT 38.0 (L) 08/09/2021   PLT 306.0 08/09/2021   GLUCOSE 259 (H) 08/23/2022   CHOL 182 08/23/2022   TRIG 100.0 08/23/2022   HDL 62.80 08/23/2022   LDLDIRECT 99.0 08/23/2022   LDLCALC 99 08/23/2022   ALT 7 08/23/2022   AST 15 08/23/2022   NA 136 08/23/2022   K 4.3 08/23/2022   CL 102 08/23/2022   CREATININE 1.63 (H) 08/23/2022   BUN 25 (H) 08/23/2022   CO2 28 08/23/2022   TSH 1.06 08/09/2021   PSA 0.75 11/12/2013   INR 0.9 12/12/2012   HGBA1C 7.2 (H) 08/23/2022   MICROALBUR 155.5 (H) 08/09/2021    US Abdomen Complete  Result Date: 10/23/2016 CLINICAL DATA:  Weight loss, abdominal pain EXAM: ABDOMEN ULTRASOUND COMPLETE COMPARISON:  CT 08/15/2016 FINDINGS: Gallbladder: No gallstones or wall thickening visualized. No sonographic Murphy sign noted by sonographer. Common bile duct: Diameter: Normal caliber, 3 mm. Liver: No focal lesion identified. Within normal limits in parenchymal echogenicity. Portal vein is patent on color Doppler imaging with normal direction of blood flow towards the liver. IVC: No abnormality visualized. Pancreas: Not well visualized due to overlying bowel gas. Spleen: Size and appearance within normal limits. Right Kidney: Length: 11.0 cm. 2.7 cm cyst off the lower pole. No hydronephrosis. Normal echotexture. Left Kidney: Length: 11.3 cm. Echogenicity within normal limits. No mass or hydronephrosis visualized. Abdominal aorta: No aneurysm visualized. Other findings: None. IMPRESSION: No acute findings or significant abnormality. Electronically  Signed   By: Charlett Nose M.D.   On: 10/23/2016 09:13   DG Chest 2 View  Result Date: 10/23/2016 CLINICAL DATA:  Right lower chest pain for 2 months, some weight loss, history of COPD EXAM: CHEST  2 VIEW COMPARISON:  Chest x-ray of 10/02/2012 FINDINGS: The lungs remain clear but hyperaerated suggestive of mild emphysematous change. Mediastinal and hilar contours are unremarkable. Minimal basilar linear scarring is present. No pneumonia or effusion is seen. The heart is within normal limits in size. Only mild degenerative changes present in the lower thoracic spine for age. IMPRESSION: Hyperaeration may indicate mild emphysematous change. No active lung disease. Electronically Signed   By: Dwyane Dee M.D.   On: 10/23/2016 08:39    Assessment & Plan:  .Anemia, unspecified type Assessment & Plan: Mild normocytic,  and improved with supplementation of B12 and  restriction of alcohol. normal colonoscopy in 2018,  EGD noted gastritis and PPI was increased to  bid transiently.  Continue once daily PPI ; CBC needs repeating    Lab Results  Component Value Date   WBC 7.1 08/09/2021   HGB 12.7 (L) 08/09/2021   HCT 38.0 (L) 08/09/2021   MCV 95.8 08/09/2021   PLT 306.0 08/09/2021     Orders: -     CBC with Differential/Platelet; Future  COPD, moderate (HCC) Assessment & Plan: Continue home nebulizer use for cost savings .  He has declined continued oxygen supplementation and is aware his prognosis .  DNR and MOST orders have been discussed on prior visit and signed.   Orders: -     Ipratropium-Albuterol; Take 3 mLs by nebulization every 6 (six) hours as needed.  Dispense: 360 mL; Refill: 2  Type 2 DM with CKD stage 3 and hypertension (HCC) -     Hemoglobin A1c -     Comprehensive metabolic panel  Hyperlipidemia with target LDL less than 70 -     LDL cholesterol, direct -     Lipid panel  Anginal equivalent  Microalbuminuria due to type 2 diabetes mellitus (HCC) Assessment & Plan: A1c  has risen to 7.2 without metformin and Jardiance.  Would avoid metformin given his chronic respiratory failure and probable underlying compensated respiratory acidosis .. he has stopped statin  due to quality of life issues but is tolerating ARB   Lab Results  Component Value Date   HGBA1C 7.2 (H) 08/23/2022   Lab Results  Component Value Date   MICROALBUR 155.5 (H) 08/09/2021   MICROALBUR 60.3 (H) 04/07/2020     Lab Results  Component Value Date   CHOL 182 08/23/2022   HDL 62.80 08/23/2022   LDLCALC 99 08/23/2022   LDLDIRECT 99.0 08/23/2022   TRIG 100.0 08/23/2022   CHOLHDL 3 08/23/2022      Renovascular hypertension Assessment & Plan: Well controlled on current regimen of amlodipine, telmisartan and metoprolol   Renal function has declined since 2023  ,  will return when well hydrated for repeat testing   Lab Results  Component Value Date   CREATININE 1.63 (H) 08/23/2022   Lab Results  Component Value Date   NA 136 08/23/2022   K 4.3 08/23/2022   CL 102 08/23/2022   CO2 28 08/23/2022      Decreased calculated glomerular filtration rate (GFR) -     Renal function panel  Other orders -     amLODIPine Besylate; Take 1 tablet (10 mg total) by mouth daily.  Dispense: 90 tablet; Refill: 1 -     methIMAzole; Take 1 tablet (5 mg total) by mouth daily.  Dispense: 90 tablet; Refill: 1 -     Omeprazole; Take 1 capsule (40 mg total) by mouth daily.  Dispense: 90 capsule; Refill: 1 -     Telmisartan; TAKE 1 TABLET BY MOUTH EVERYDAY AT BEDTIME  Dispense: 90 tablet; Refill: 1   Follow-up: Return in about 6 months (around 02/23/2023).   Sherlene Shams, MD

## 2022-08-23 NOTE — Patient Instructions (Signed)
You or Bev can call the office to reschedule her PROLIA INJECTION   Senior day services  are available to take patients with cognitive impairment .    Some options around [facility] are Iisted below with contact information :   [email or web address] [address], [place] [phone]  [email or web address] [address] [place], [place] [address] Phone: [phone]  [facility] [address], [place] [address] [phone]  [facility], [email or web address] Haskell Flirt - Director [address], [place] [address] [phone]

## 2022-08-25 NOTE — Assessment & Plan Note (Signed)
Continue home nebulizer use for cost savings .  He has declined continued oxygen supplementation and is aware his prognosis .  DNR and MOST orders have been discussed on prior visit and signed.

## 2022-08-25 NOTE — Assessment & Plan Note (Signed)
A1c has risen to 7.2 without metformin and Jardiance.  Would avoid metformin given his chronic respiratory failure and probable underlying compensated respiratory acidosis .. he has stopped statin  due to quality of life issues but is tolerating ARB   Lab Results  Component Value Date   HGBA1C 7.2 (H) 08/23/2022   Lab Results  Component Value Date   MICROALBUR 155.5 (H) 08/09/2021   MICROALBUR 60.3 (H) 04/07/2020     Lab Results  Component Value Date   CHOL 182 08/23/2022   HDL 62.80 08/23/2022   LDLCALC 99 08/23/2022   LDLDIRECT 99.0 08/23/2022   TRIG 100.0 08/23/2022   CHOLHDL 3 08/23/2022

## 2022-08-25 NOTE — Assessment & Plan Note (Addendum)
Well controlled on current regimen of amlodipine, telmisartan and metoprolol   Renal function has declined since 2023  ,  will return when well hydrated for repeat testing   Lab Results  Component Value Date   CREATININE 1.63 (H) 08/23/2022   Lab Results  Component Value Date   NA 136 08/23/2022   K 4.3 08/23/2022   CL 102 08/23/2022   CO2 28 08/23/2022

## 2022-08-25 NOTE — Assessment & Plan Note (Signed)
Mild normocytic,  and improved with supplementation of B12 and restriction of alcohol. normal colonoscopy in 2018,  EGD noted gastritis and PPI was increased to  bid transiently.  Continue once daily PPI ; CBC needs repeating    Lab Results  Component Value Date   WBC 7.1 08/09/2021   HGB 12.7 (L) 08/09/2021   HCT 38.0 (L) 08/09/2021   MCV 95.8 08/09/2021   PLT 306.0 08/09/2021

## 2023-01-26 DIAGNOSIS — S63285A Dislocation of proximal interphalangeal joint of left ring finger, initial encounter: Secondary | ICD-10-CM | POA: Diagnosis not present

## 2023-02-01 DIAGNOSIS — S63285A Dislocation of proximal interphalangeal joint of left ring finger, initial encounter: Secondary | ICD-10-CM | POA: Diagnosis not present

## 2023-02-13 ENCOUNTER — Ambulatory Visit: Payer: Self-pay | Admitting: Internal Medicine

## 2023-02-13 NOTE — Telephone Encounter (Signed)
 Chief Complaint: Pain with Urination Symptoms: Pain with urination Frequency: 2 days Pertinent Negatives: Patient denies fever, flank pain, blood in urine Disposition: [] ED /[] Urgent Care (no appt availability in office) / [] Appointment(In office/virtual)/ []  [facility]/ [] Home Care/ [x] Refused Recommended Disposition /[] [facility]/ []  Follow-up with PCP Additional Notes: Patient called and advised that he is having pain with urination since late Monday night (2 days ago).  He denies any fevers, flank pain, bladder pain, testicular pain, or blood in his urine.  Patient states that the pain is a 10 when trying to urinate.  He advised he has never had a urinary tract infection before.  He also states that he goes to urinate at least twice an hour.  He states that his bladder does not feel full.  He is a diabetic.  He also had not taken any medications for the pain.  Patient is advised that it is recommended that he be seen and evaluated by a provider within the next 4 hours because painful urination/not being able to produce urine can be serious. He said that he can urinate some, it just takes time and he is not producing much.  He is asked if he feels like he is adequately hydrating and he said he is.  Patient is advised that there are no immediate openings at his PCP office and that urgent cares could be an option.  Patient became frustrated, stating that he needs to get a cup to provider a urine sample, take it home, and collect a urine specimen over time.  Patient did not want an appointment or to go try and urinate at an office of any kind.  This RN advised him that I would contact the [facility] to see if it was possible for him to just be able to pick up a urine cup.  Reason for Disposition  Diabetes mellitus or weak immune system (e.g., HIV positive, cancer chemo, splenectomy, organ transplant, chronic steroids)  Answer Assessment - Initial Assessment Questions 1. SEVERITY: "How  bad is the pain?"  (e.g., Scale 1-10; mild, moderate, or severe)   - MILD (1-3): Complains slightly about urination hurting.   - MODERATE (4-7): Interferes with normal activities.     - SEVERE (8-10): Excruciating, unwilling or unable to urinate because of the pain.      10 2. FREQUENCY: "How many times have you had painful urination today?"      At least twice an hour 3. PATTERN: "Is pain present every time you urinate or just sometimes?"      Pain every time 4. ONSET: "When did the painful urination start?"      Late Monday night (2 days ago) 5. FEVER: "Do you have a fever?" If Yes, ask: "What is your temperature, how was it measured, and when did it start?"     no 6. PAST UTI: "Have you had a urine infection before?" If Yes, ask: "When was the last time?" and "What happened that time?"      no 7. CAUSE: "What do you think is causing the painful urination?"      "UTI" 8. OTHER SYMPTOMS: "Do you have any other symptoms?" (e.g., flank pain, penis discharge, scrotal pain, blood in urine)     no  Answer Assessment - Initial Assessment Questions 1. SYMPTOM: "What's the main symptom you're concerned about?" (e.g., frequency, incontinence)     Urinary retention 2. ONSET: "When did the  retention  start?"     Late  Monday night (2 days ago) 3. PAIN: "Is there any pain?" If Yes, ask: "How bad is it?" (Scale: 1-10; mild, moderate, severe)     10 4. CAUSE: "What do you think is causing the symptoms?"     UTI 5. OTHER SYMPTOMS: "Do you have any other symptoms?" (e.g., blood in urine, fever, flank pain, pain with urination)     Pain with urination  Protocols used: Urinary Symptoms-A-AH, Urination Pain - Male-A-AH

## 2023-02-13 NOTE — Telephone Encounter (Signed)
 This RN attempted to contact patient for triage, no answer, voicemail left requesting return call to clinic.  1st attempt to reach patient, LVM  Copied from CRM [id number]. Topic: Clinical - Medical Advice >> Feb 13, 2023 10:42 AM Julie Oddi wrote: Reason for CRM: Patient called in requesting to speak with nurse. Patient stated that he believes he has a UTI and wants to have it tested to make sure. Please contact patient for further assistance. Appointment was offered but patient declined and wants to speak with nurse. Please contact patient at [phone].

## 2023-02-13 NOTE — Telephone Encounter (Signed)
 1st attempt to reach patient, LVM  Copied from CRM [id number]. Topic: Clinical - Medical Advice >> Feb 13, 2023 10:42 AM Julie Oddi wrote: Reason for CRM: Patient called in requesting to speak with nurse. Patient stated that he believes he has a UTI and wants to have it tested to make sure. Please contact patient for further assistance. Appointment was offered but patient declined and wants to speak with nurse. Please contact patient at [phone].

## 2023-02-14 ENCOUNTER — Encounter: Payer: Self-pay | Admitting: Nurse Practitioner

## 2023-02-14 ENCOUNTER — Ambulatory Visit (INDEPENDENT_AMBULATORY_CARE_PROVIDER_SITE_OTHER): Payer: PPO | Admitting: Nurse Practitioner

## 2023-02-14 VITALS — BP 114/68 | HR 69 | Temp 97.3°F | Ht 73.0 in | Wt 141.8 lb

## 2023-02-14 DIAGNOSIS — R339 Retention of urine, unspecified: Secondary | ICD-10-CM | POA: Diagnosis not present

## 2023-02-14 DIAGNOSIS — R300 Dysuria: Secondary | ICD-10-CM

## 2023-02-14 LAB — URINALYSIS, ROUTINE W REFLEX MICROSCOPIC
Bilirubin Urine: NEGATIVE
Ketones, ur: NEGATIVE
Nitrite: NEGATIVE
Specific Gravity, Urine: 1.02 (ref 1.000–1.030)
Total Protein, Urine: >=300 — AB
Urine Glucose: NEGATIVE
Urobilinogen, UA: 0.2 (ref 0.0–1.0)
pH: 6 (ref 5.0–8.0)

## 2023-02-14 LAB — POC URINALSYSI DIPSTICK (AUTOMATED)
Bilirubin, UA: NEGATIVE
Glucose, UA: NEGATIVE
Glucose, UA: NEGATIVE
Ketones, UA: NEGATIVE
Ketones, UA: NEGATIVE
Nitrite, UA: NEGATIVE
Nitrite, UA: NEGATIVE
Protein, UA: POSITIVE
Protein, UA: POSITIVE — AB
Spec Grav, UA: 1.025 (ref 1.010–1.025)
Spec Grav, UA: 1.025 (ref 1.010–1.025)
Urobilinogen, UA: NORMAL
Urobilinogen, UA: 0.2 U/dL
pH, UA: 6 (ref 5.0–8.0)
pH, UA: 6 (ref 5.0–8.0)

## 2023-02-14 NOTE — Progress Notes (Signed)
Established Patient Office Visit  Subjective:  Patient ID: Terry [name], male    DOB: 1933-11-22  Age: 89 y.o. MRN: [id number]  CC:  Chief Complaint  Patient presents with   Acute Visit    Dysuria/Urine retention?   Discussed the use of a AI scribe software for clinical note transcription with the patient, who gave verbal consent to proceed.  HPI  The patient, with history of enlarged prostate with urinary retention, presented with a symptoms of UTI that began on Monday. He reported experiencing significant pain during urination, which was described as a small amount of output. The pain was localized to the penile area, with no associated bladder discomfort. The patient noted that the urge to urinate often occurred upon standing, and the act of urination was consistently painful.  Due to the associated pain, the patient admitted to reducing fluid intake, including water and cranberry juice, to minimize the frequency of urination. Despite this, he reported urinating three to four times in the morning, each time producing a small amount of urine accompanied by pain.  The patient brought in a urine sample collected the previous day from home but is able to provide urine sample in the office today.  Past Medical History:  Diagnosis Date   Anemia    COPD (chronic obstructive pulmonary disease) (HCC)    Diabetes mellitus 2008   GERD (gastroesophageal reflux disease)    Hyperlipidemia    Hypertension    hyperthyroidism    Squamous cell carcinoma of skin of left upper arm November 2015   Excised to negative margins, associated keratoacanthoma.   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 Niel Hummer. He is due for followup colonoscopy     Past Surgical History:  Procedure Laterality Date   APPENDECTOMY  1970   ARM SKIN LESION BIOPSY / EXCISION Left 2016   CATARACT EXTRACTION, BILATERAL  2012   [facility]   COLONOSCOPY  12-10-12   COLONOSCOPY W/  POLYPECTOMY  2011   Dr. Drue Stager   COLONOSCOPY WITH PROPOFOL N/A 09/19/2016   Procedure: COLONOSCOPY WITH PROPOFOL;  Surgeon: Earline Mayotte, MD;  Location: [facility] ENDOSCOPY;  Service: Endoscopy;  Laterality: N/A;   ESOPHAGOGASTRODUODENOSCOPY (EGD) WITH PROPOFOL N/A 09/19/2016   Procedure: ESOPHAGOGASTRODUODENOSCOPY (EGD) WITH PROPOFOL;  Surgeon: Earline Mayotte, MD;  Location: [facility] ENDOSCOPY;  Service: Endoscopy;  Laterality: N/A;    Family History  Problem Relation Age of Onset   Hypertension Father    Diabetes Maternal Aunt    Hyperlipidemia Maternal Uncle    Birth defects Neg Hx     Social History   Socioeconomic History   Marital status: Married    Spouse name: Not on file   Number of children: Not on file   Years of education: Not on file   Highest education level: Not on file  Occupational History   Not on file  Tobacco Use   Smoking status: Former    Current packs/day: 0.00    Average packs/day: 2.0 packs/day for 50.0 years (100.0 ttl pk-yrs)    Types: Cigarettes    Start date: 06/25/1949    Quit date: 06/26/1999    Years since quitting: 23.6   Smokeless tobacco: Never  Vaping Use   Vaping status: Never Used  Substance and Sexual Activity   Alcohol use: Not Currently   Drug use: No   Sexual activity: Yes  Other Topics Concern   Not on file  Social History Narrative  Not on file   Social Drivers of Health   Financial Resource Strain: Low Risk  (07/24/2022)   Overall Financial Resource Strain (CARDIA)    Difficulty of Paying Living Expenses: Not hard at all  Food Insecurity: No Food Insecurity (07/24/2022)   Hunger Vital Sign    Worried About Running Out of Food in the Last Year: Never true    Ran Out of Food in the Last Year: Never true  Transportation Needs: No Transportation Needs (07/24/2022)   PRAPARE - Administrator, Civil Service (Medical): No    Lack of Transportation (Non-Medical): No  Physical Activity: Insufficiently Active  (07/24/2022)   Exercise Vital Sign    Days of Exercise per Week: 5 days    Minutes of Exercise per Session: 20 min  Stress: No Stress Concern Present (07/24/2022)   [employer] of Occupational Health - Occupational Stress Questionnaire    Feeling of Stress : Not at all  Social Connections: Socially Integrated (07/24/2022)   Social Connection and Isolation Panel [NHANES]    Frequency of Communication with Friends and Family: More than three times a week    Frequency of Social Gatherings with Friends and Family: More than three times a week    Attends Religious Services: More than 4 times per year    Active Member of [employer] or Organizations: Yes    Attends Engineer, structural: More than 4 times per year    Marital Status: Married  Catering manager Violence: Not At Risk (07/24/2022)   Humiliation, Afraid, Rape, and Kick questionnaire    Fear of Current or Ex-Partner: No    Emotionally Abused: No    Physically Abused: No    Sexually Abused: No     Outpatient Medications Prior to Visit  Medication Sig Dispense Refill   albuterol (VENTOLIN HFA) 108 (90 Base) MCG/ACT inhaler Inhale 2 puffs into the lungs every 6 (six) hours as needed for wheezing or shortness of breath. 3.7 g 11   amLODipine (NORVASC) 10 MG tablet Take 1 tablet (10 mg total) by mouth daily. 90 tablet 1   aspirin EC 81 MG tablet Take 1 tablet (81 mg total) by mouth daily. 90 tablet 3   b complex vitamins tablet Take 1 tablet by mouth daily.     budesonide (PULMICORT) 0.5 MG/2ML nebulizer solution Take 2 mLs (0.5 mg total) by nebulization daily. 60 mL 12   cyanocobalamin 1000 MCG tablet Take 1,000 mcg by mouth daily.     ipratropium-albuterol (DUONEB) 0.5-2.5 (3) MG/3ML SOLN Take 3 mLs by nebulization every 6 (six) hours as needed. 360 mL 2   methimazole (TAPAZOLE) 5 MG tablet Take 1 tablet (5 mg total) by mouth daily. 90 tablet 1   metoprolol succinate (TOPROL-XL) 50 MG 24 hr tablet TAKE 1 TABLET BY MOUTH EVERY  DAY 90 tablet 1   omeprazole (PRILOSEC) 40 MG capsule Take 1 capsule (40 mg total) by mouth daily. 90 capsule 1   tadalafil (CIALIS) 5 MG tablet Take 1 tablet (5 mg total) by mouth daily as needed for erectile dysfunction. 10 tablet 0   telmisartan (MICARDIS) 80 MG tablet TAKE 1 TABLET BY MOUTH EVERYDAY AT BEDTIME 90 tablet 1   Facility-Administered Medications Prior to Visit  Medication Dose Route Frequency Provider Last Rate Last Admin   ipratropium-albuterol (DUONEB) 0.5-2.5 (3) MG/3ML nebulizer solution 3 mL  3 mL Nebulization Q6H Allegra Grana, FNP        No Known Allergies  ROS  Review of Systems Negative unless indicated in HPI.    Objective:    Physical Exam Constitutional:      Appearance: Normal appearance.  Cardiovascular:     Rate and Rhythm: Normal rate and regular rhythm.     Pulses: Normal pulses.     Heart sounds: Normal heart sounds.  Abdominal:     General: Abdomen is flat. Bowel sounds are normal.     Palpations: Abdomen is soft.     Tenderness: There is no abdominal tenderness. There is no right CVA tenderness or left CVA tenderness.  Musculoskeletal:     Cervical back: Normal range of motion.  Neurological:     General: No focal deficit present.     Mental Status: He is alert. Mental status is at baseline.  Psychiatric:        Mood and Affect: Mood normal.        Behavior: Behavior normal.        Thought Content: Thought content normal.        Judgment: Judgment normal.     BP 114/68   Pulse 69   Temp (!) 97.3 F (36.3 C)   Ht 6\' 1"  (1.854 m)   Wt 141 lb 12.8 oz (64.3 kg)   SpO2 93%   BMI 18.71 kg/m  Wt Readings from Last 3 Encounters:  02/14/23 141 lb 12.8 oz (64.3 kg)  08/23/22 155 lb 12.8 oz (70.7 kg)  07/23/22 160 lb (72.6 kg)     Health Maintenance  Topic Date Due   OPHTHALMOLOGY EXAM  10/24/2021   FOOT EXAM  11/11/2022   COVID-19 Vaccine (4 - 2024-25 season) 03/02/2023 (Originally 09/30/2022)   INFLUENZA VACCINE  04/29/2023  (Originally 08/30/2022)   HEMOGLOBIN A1C  02/23/2023   DTaP/Tdap/Td (2 - Td or Tdap) 02/26/2023   Medicare Annual Wellness (AWV)  07/23/2023   Pneumonia Vaccine 78+ Years old  Completed   HPV VACCINES  Aged Out   Zoster Vaccines- Shingrix  Discontinued    There are no preventive care reminders to display for this patient.  Lab Results  Component Value Date   TSH 1.06 08/09/2021   Lab Results  Component Value Date   WBC 7.1 08/09/2021   HGB 12.7 (L) 08/09/2021   HCT 38.0 (L) 08/09/2021   MCV 95.8 08/09/2021   PLT 306.0 08/09/2021   Lab Results  Component Value Date   NA 136 08/23/2022   K 4.3 08/23/2022   CO2 28 08/23/2022   GLUCOSE 259 (H) 08/23/2022   BUN 25 (H) 08/23/2022   CREATININE 1.63 (H) 08/23/2022   BILITOT 0.5 08/23/2022   ALKPHOS 50 08/23/2022   AST 15 08/23/2022   ALT 7 08/23/2022   PROT 6.3 08/23/2022   ALBUMIN 3.7 08/23/2022   CALCIUM 9.2 08/23/2022   ANIONGAP 5 (L) 12/13/2012   GFR 37.64 (L) 08/23/2022   Lab Results  Component Value Date   CHOL 182 08/23/2022   Lab Results  Component Value Date   HDL 62.80 08/23/2022   Lab Results  Component Value Date   LDLCALC 99 08/23/2022   Lab Results  Component Value Date   TRIG 100.0 08/23/2022   Lab Results  Component Value Date   CHOLHDL 3 08/23/2022   Lab Results  Component Value Date   HGBA1C 7.2 (H) 08/23/2022      Assessment & Plan:  Dysuria Assessment & Plan: Painful urination with decreased urine output since Monday. No abdominal pain or bloating.  POCT urinalysis positive for  leukocytes, moderate blood and negative for nitrite. Will send urine for culture and treat according to the culture result. Advised patient to increase fluid intake.   Orders: -     Urinalysis, Routine w reflex microscopic -     POCT Urinalysis Dipstick (Automated) -     Urine Culture -     POCT Urinalysis Dipstick (Automated)  Other orders -     Sulfamethoxazole-Trimethoprim; Take 1 tablet by mouth 2  (two) times daily.  Dispense: 14 tablet; Refill: 0    Follow-up: Return if symptoms worsen or fail to improve.   Kara Dies, NP

## 2023-02-15 ENCOUNTER — Ambulatory Visit: Payer: Self-pay | Admitting: Internal Medicine

## 2023-02-15 LAB — URINE CULTURE
MICRO NUMBER:: 15964621
Result:: NO GROWTH
SPECIMEN QUALITY:: ADEQUATE

## 2023-02-15 MED ORDER — SULFAMETHOXAZOLE-TRIMETHOPRIM 800-160 MG PO TABS: 1.0000 | ORAL_TABLET | Freq: Two times a day (BID) | ORAL | 0 refills | Status: DC

## 2023-02-15 NOTE — Telephone Encounter (Signed)
Left message to call the office back regarding the lab results below. Okay to give lab results.

## 2023-02-15 NOTE — Telephone Encounter (Signed)
Called Patient and he is frustrated that he has been like this since Monday but I explained well sir you just came in yesterday and we had to send the urine out for testing. I also explained that the testing usually takes a couple of days before we get the results and that someone would call him soon as the results come in. The Patient said "what in a week or two." I replied sir it should be in the beginning of next week. I apologized for him feeling bad since Monday but let him know he was not seen by the doctor until yesterday and then we have to have time to run the tests and get the results. Patient said okay you gave me your word that someone will call when the results come in and I said yes sir we will call when the results come in.

## 2023-02-15 NOTE — Telephone Encounter (Signed)
Patient called in requesting to speak with a medical professional in the office regarding lab results related to his office visit yesterday. Patient is still experiencing pain when urinating and declined triage at this time. This RN called [facility] and was told that the nurse was "in clinic" and would have to call the patient back. This RN advised patient that he would receive a call back from the clinic. Patient appeared to be very frustrated on the phone with this RN. Patient is requesting a call back as soon as possible.  Copied from CRM [id number]. Topic: Clinical - Red Word Triage >> Feb 15, 2023 10:11 AM Marica Otter wrote: [facility] that prompted transfer to Nurse Triage: Patient states he's still having pain in urinary system. Reason for Disposition  [1] Caller requesting NON-URGENT health information AND [2] PCP's office is the best resource  Protocols used: Information Only Call - No Triage-A-AH

## 2023-02-15 NOTE — Telephone Encounter (Signed)
Please call pt the culture result is not back yet  but will start him on the antibiotic and will change if needed according to the culture result.

## 2023-02-16 NOTE — Assessment & Plan Note (Signed)
Painful urination with decreased urine output since Monday. No abdominal pain or bloating.  POCT urinalysis positive for leukocytes, moderate blood and negative for nitrite. Will send urine for culture and treat according to the culture result. Advised patient to increase fluid intake.

## 2023-02-17 ENCOUNTER — Other Ambulatory Visit: Payer: Self-pay | Admitting: Internal Medicine

## 2023-02-18 ENCOUNTER — Telehealth: Payer: Self-pay

## 2023-02-18 NOTE — Progress Notes (Signed)
Please inform patient: The urine culture is negative for UTI.  How is his urinary symptoms now?

## 2023-02-18 NOTE — Telephone Encounter (Signed)
Left message to call the office back regarding the lab results. Okay to give lab results and ask how the Patient is feeling.

## 2023-02-18 NOTE — Telephone Encounter (Signed)
-----   Message from Terry [name] sent at 02/18/2023 12:23 AM EST ----- Please inform patient: The urine culture is negative for UTI.  How is his urinary symptoms now?

## 2023-02-19 NOTE — Telephone Encounter (Signed)
Called Patient and he states he is better since taking the antibiotic. Patient states no symptoms since starting the antibiotics. Patient would like to know why that is since the culture was negative.

## 2023-02-25 ENCOUNTER — Ambulatory Visit: Payer: PPO | Admitting: Internal Medicine

## 2023-05-10 DIAGNOSIS — M25511 Pain in right shoulder: Secondary | ICD-10-CM | POA: Diagnosis not present

## 2023-05-10 DIAGNOSIS — M25512 Pain in left shoulder: Secondary | ICD-10-CM | POA: Diagnosis not present

## 2023-05-16 ENCOUNTER — Other Ambulatory Visit: Payer: Self-pay | Admitting: Internal Medicine

## 2023-06-20 ENCOUNTER — Ambulatory Visit: Payer: Self-pay

## 2023-06-20 NOTE — Telephone Encounter (Signed)
 This RN made a second attempt to contact the pt. RN LVM with a callback number.

## 2023-06-20 NOTE — Telephone Encounter (Signed)
 Called was disconnected upon transfer. Attempted to return call but received no answer, left message to return call to office for triage. Placing in call back for further attempts to reach patient.    Copied from CRM [id number]. Topic: Clinical - Red Word Triage >> Jun 20, 2023 12:45 PM Adonis Hoot wrote: Red Word that prompted transfer to Nurse Triage: cough,fever,not eating Reason for Disposition . Message left on unidentified voice mail.  Phone number verified.  Protocols used: No Contact or Duplicate Contact Call-A-AH

## 2023-06-21 NOTE — Telephone Encounter (Signed)
 Spoke to pt's wife Braymer. Stated that pt was still asleep and that he isn't doing any better. Informed her pt needs to be evaluated at urgent care to ensure he gets proper treatment. She verbalized understanding.  Sending to [facility] and PCP for fyi purposes. Pt is scheduled to see Dr. Madelon Scheuermann on that 05/27 as well

## 2023-06-25 ENCOUNTER — Ambulatory Visit

## 2023-06-25 ENCOUNTER — Ambulatory Visit (INDEPENDENT_AMBULATORY_CARE_PROVIDER_SITE_OTHER): Admitting: Internal Medicine

## 2023-06-25 ENCOUNTER — Encounter: Payer: Self-pay | Admitting: Internal Medicine

## 2023-06-25 VITALS — BP 132/70 | HR 91 | Ht 73.0 in | Wt 138.0 lb

## 2023-06-25 DIAGNOSIS — J449 Chronic obstructive pulmonary disease, unspecified: Secondary | ICD-10-CM | POA: Diagnosis not present

## 2023-06-25 DIAGNOSIS — E1129 Type 2 diabetes mellitus with other diabetic kidney complication: Secondary | ICD-10-CM | POA: Diagnosis not present

## 2023-06-25 DIAGNOSIS — E1122 Type 2 diabetes mellitus with diabetic chronic kidney disease: Secondary | ICD-10-CM | POA: Diagnosis not present

## 2023-06-25 DIAGNOSIS — N183 Chronic kidney disease, stage 3 unspecified: Secondary | ICD-10-CM | POA: Diagnosis not present

## 2023-06-25 DIAGNOSIS — I150 Renovascular hypertension: Secondary | ICD-10-CM

## 2023-06-25 DIAGNOSIS — F1011 Alcohol abuse, in remission: Secondary | ICD-10-CM | POA: Diagnosis not present

## 2023-06-25 DIAGNOSIS — E785 Hyperlipidemia, unspecified: Secondary | ICD-10-CM

## 2023-06-25 DIAGNOSIS — R252 Cramp and spasm: Secondary | ICD-10-CM | POA: Diagnosis not present

## 2023-06-25 DIAGNOSIS — R634 Abnormal weight loss: Secondary | ICD-10-CM

## 2023-06-25 DIAGNOSIS — R809 Proteinuria, unspecified: Secondary | ICD-10-CM

## 2023-06-25 DIAGNOSIS — I129 Hypertensive chronic kidney disease with stage 1 through stage 4 chronic kidney disease, or unspecified chronic kidney disease: Secondary | ICD-10-CM

## 2023-06-25 DIAGNOSIS — E0590 Thyrotoxicosis, unspecified without thyrotoxic crisis or storm: Secondary | ICD-10-CM

## 2023-06-25 DIAGNOSIS — R918 Other nonspecific abnormal finding of lung field: Secondary | ICD-10-CM | POA: Diagnosis not present

## 2023-06-25 MED ORDER — AMLODIPINE BESYLATE 10 MG PO TABS: 10.0000 mg | ORAL_TABLET | Freq: Every day | ORAL | 1 refills | Status: DC

## 2023-06-25 MED ORDER — TELMISARTAN 80 MG PO TABS: ORAL_TABLET | ORAL | 1 refills | Status: DC

## 2023-06-25 MED ORDER — TIZANIDINE HCL 4 MG PO TABS: 4.0000 mg | ORAL_TABLET | Freq: Four times a day (QID) | ORAL | 0 refills | Status: DC | PRN

## 2023-06-25 MED ORDER — METOPROLOL SUCCINATE ER 50 MG PO TB24: 50.0000 mg | ORAL_TABLET | Freq: Every day | ORAL | 1 refills | Status: DC

## 2023-06-25 MED ORDER — OMEPRAZOLE 40 MG PO CPDR: 40.0000 mg | DELAYED_RELEASE_CAPSULE | Freq: Every day | ORAL | 1 refills | Status: DC

## 2023-06-25 NOTE — Assessment & Plan Note (Signed)
 He has remained abstinent

## 2023-06-25 NOTE — Assessment & Plan Note (Signed)
 A1c had risen to 7.2 without metformin  and Jardiance . And he has been lost to follow up   Would avoid metformin  given his chronic respiratory failure and probable underlying compensated respiratory acidosis .. he has stopped statin  due to quality of life issues but is tolerating ARB   Lab Results  Component Value Date   HGBA1C 7.2 (H) 08/23/2022   Lab Results  Component Value Date   MICROALBUR 155.5 (H) 08/09/2021   MICROALBUR 60.3 (H) 04/07/2020     Lab Results  Component Value Date   CHOL 182 08/23/2022   HDL 62.80 08/23/2022   LDLCALC 99 08/23/2022   LDLDIRECT 99.0 08/23/2022   TRIG 100.0 08/23/2022   CHOLHDL 3 08/23/2022

## 2023-06-25 NOTE — Assessment & Plan Note (Signed)
 Diffuse and recurrent , involving both arms and legs. Suspect hyponatremia. Labs pending

## 2023-06-25 NOTE — Progress Notes (Signed)
 Subjective:  Patient ID: Terry [name], male    DOB: 1934/01/24  Age: 89 y.o. MRN: [id number]  CC: The primary encounter diagnosis was Type 2 DM with CKD stage 3 and hypertension (HCC). Diagnoses of Renovascular hypertension, Hyperthyroidism, Hyperlipidemia with target LDL less than 70, Microalbuminuria due to type 2 diabetes mellitus (HCC), Muscle cramps, Recent unintentional weight loss over several months, History of alcohol abuse, and COPD, moderate (HCC) were also pertinent to this visit.   HPI Autry Boas presents for  Chief Complaint  Patient presents with   muscle cramps in arms and legs   RECEIVED A STEROID INJECTION IN BOTH SHOULDERS 6 WEEKS AGO  AFTER HAVING FLARE OF SHOULDER PAIN CAUSED BY CLEANING OUT GARAGE .  SINCE THEN HAS BEEN HAVING SEVERE MUSCLES CRAMPS IN BOTH CALVES,  BOTH ARMS , WORSE IN THE MORNING AROUND 6 AM WAKING  HIM UP  . DIFFICULTY STANDING UP DUE TO CRAMPS.  HAS BEEN    Outpatient Medications Prior to Visit  Medication Sig Dispense Refill   albuterol  (VENTOLIN  HFA) 108 (90 Base) MCG/ACT inhaler Inhale 2 puffs into the lungs every 6 (six) hours as needed for wheezing or shortness of breath. 3.7 g 11   aspirin  EC 81 MG tablet Take 1 tablet (81 mg total) by mouth daily. 90 tablet 3   b complex vitamins tablet Take 1 tablet by mouth daily.     budesonide  (PULMICORT ) 0.5 MG/2ML nebulizer solution Take 2 mLs (0.5 mg total) by nebulization daily. 60 mL 12   cyanocobalamin  1000 MCG tablet Take 1,000 mcg by mouth daily.     ipratropium-albuterol  (DUONEB) 0.5-2.5 (3) MG/3ML SOLN Take 3 mLs by nebulization every 6 (six) hours as needed. 360 mL 2   methimazole  (TAPAZOLE ) 5 MG tablet TAKE 1 TABLET (5 MG TOTAL) BY MOUTH DAILY. 90 tablet 0   sulfamethoxazole -trimethoprim  (BACTRIM  DS) 800-160 MG tablet Take 1 tablet by mouth 2 (two) times daily. 14 tablet 0   tadalafil  (CIALIS ) 5 MG tablet Take 1 tablet (5 mg total) by mouth daily as needed for erectile dysfunction. 10  tablet 0   amLODipine  (NORVASC ) 10 MG tablet TAKE 1 TABLET BY MOUTH EVERY DAY 90 tablet 1   metoprolol  succinate (TOPROL -XL) 50 MG 24 hr tablet TAKE 1 TABLET BY MOUTH EVERY DAY 90 tablet 1   omeprazole  (PRILOSEC) 40 MG capsule TAKE 1 CAPSULE (40 MG TOTAL) BY MOUTH DAILY. 90 capsule 1   telmisartan  (MICARDIS ) 80 MG tablet TAKE 1 TABLET BY MOUTH EVERYDAY AT BEDTIME 90 tablet 1   Facility-Administered Medications Prior to Visit  Medication Dose Route Frequency Provider Last Rate Last Admin   ipratropium-albuterol  (DUONEB) 0.5-2.5 (3) MG/3ML nebulizer solution 3 mL  3 mL Nebulization Q6H Calista Catching, FNP        Review of Systems;  Patient denies headache, fevers, malaise, unintentional weight loss, skin rash, eye pain, sinus congestion and sinus pain, sore throat, dysphagia,  hemoptysis , cough, dyspnea, wheezing, chest pain, palpitations, orthopnea, edema, abdominal pain, nausea, melena, diarrhea, constipation, flank pain, dysuria, hematuria, urinary  Frequency, nocturia, numbness, tingling, seizures,  Focal weakness, Loss of consciousness,  Tremor, insomnia, depression, anxiety, and suicidal ideation.      Objective:  BP 132/70   Pulse 91   Ht 6\' 1"  (1.854 m)   Wt 138 lb (62.6 kg)   SpO2 97%   BMI 18.21 kg/m   BP Readings from Last 3 Encounters:  06/25/23 132/70  02/14/23 114/68  08/23/22 Aaron Aas)  132/56    Wt Readings from Last 3 Encounters:  06/25/23 138 lb (62.6 kg)  02/14/23 141 lb 12.8 oz (64.3 kg)  08/23/22 155 lb 12.8 oz (70.7 kg)    Physical Exam Vitals reviewed.  Constitutional:      General: He is not in acute distress.    Appearance: Normal appearance. He is cachectic. He is not ill-appearing, toxic-appearing or diaphoretic.  HENT:     Head: Normocephalic.  Eyes:     General: No scleral icterus.       Right eye: No discharge.        Left eye: No discharge.     Conjunctiva/sclera: Conjunctivae normal.  Cardiovascular:     Rate and Rhythm: Normal rate and  regular rhythm.     Heart sounds: Normal heart sounds.  Pulmonary:     Effort: Pulmonary effort is normal. No respiratory distress.     Breath sounds: Normal breath sounds.  Musculoskeletal:        General: Normal range of motion.     Cervical back: Normal range of motion.  Skin:    General: Skin is warm and dry.  Neurological:     General: No focal deficit present.     Mental Status: He is alert and oriented to person, place, and time. Mental status is at baseline.  Psychiatric:        Mood and Affect: Mood normal.        Behavior: Behavior normal.        Thought Content: Thought content normal.        Judgment: Judgment normal.    Lab Results  Component Value Date   HGBA1C 7.2 (H) 08/23/2022   HGBA1C 6.6 (H) 08/09/2021   HGBA1C 6.8 (H) 11/02/2020    Lab Results  Component Value Date   CREATININE 1.63 (H) 08/23/2022   CREATININE 1.29 08/09/2021   CREATININE 1.33 11/02/2020    Lab Results  Component Value Date   WBC 7.1 08/09/2021   HGB 12.7 (L) 08/09/2021   HCT 38.0 (L) 08/09/2021   PLT 306.0 08/09/2021   GLUCOSE 259 (H) 08/23/2022   CHOL 182 08/23/2022   TRIG 100.0 08/23/2022   HDL 62.80 08/23/2022   LDLDIRECT 99.0 08/23/2022   LDLCALC 99 08/23/2022   ALT 7 08/23/2022   AST 15 08/23/2022   NA 136 08/23/2022   K 4.3 08/23/2022   CL 102 08/23/2022   CREATININE 1.63 (H) 08/23/2022   BUN 25 (H) 08/23/2022   CO2 28 08/23/2022   TSH 1.06 08/09/2021   PSA 0.75 11/12/2013   INR 0.9 12/12/2012   HGBA1C 7.2 (H) 08/23/2022   MICROALBUR 155.5 (H) 08/09/2021    US  Abdomen Complete Result Date: 10/23/2016 CLINICAL DATA:  Weight loss, abdominal pain EXAM: ABDOMEN ULTRASOUND COMPLETE COMPARISON:  CT 08/15/2016 FINDINGS: Gallbladder: No gallstones or wall thickening visualized. No sonographic Murphy sign noted by sonographer. Common bile duct: Diameter: Normal caliber, 3 mm. Liver: No focal lesion identified. Within normal limits in parenchymal echogenicity. Portal vein  is patent on color Doppler imaging with normal direction of blood flow towards the liver. IVC: No abnormality visualized. Pancreas: Not well visualized due to overlying bowel gas. Spleen: Size and appearance within normal limits. Right Kidney: Length: 11.0 cm. 2.7 cm cyst off the lower pole. No hydronephrosis. Normal echotexture. Left Kidney: Length: 11.3 cm. Echogenicity within normal limits. No mass or hydronephrosis visualized. Abdominal aorta: No aneurysm visualized. Other findings: None. IMPRESSION: No acute findings or significant abnormality. Electronically  Signed   By: Janeece Mechanic M.D.   On: 10/23/2016 09:13   DG Chest 2 View Result Date: 10/23/2016 CLINICAL DATA:  Right lower chest pain for 2 months, some weight loss, history of COPD EXAM: CHEST  2 VIEW COMPARISON:  Chest x-ray of 10/02/2012 FINDINGS: The lungs remain clear but hyperaerated suggestive of mild emphysematous change. Mediastinal and hilar contours are unremarkable. Minimal basilar linear scarring is present. No pneumonia or effusion is seen. The heart is within normal limits in size. Only mild degenerative changes present in the lower thoracic spine for age. IMPRESSION: Hyperaeration may indicate mild emphysematous change. No active lung disease. Electronically Signed   By: Gerrie Krebs M.D.   On: 10/23/2016 08:39    Assessment & Plan:  .Type 2 DM with CKD stage 3 and hypertension (HCC) -     Comprehensive metabolic panel with GFR -     Hemoglobin A1c -     Microalbumin / creatinine urine ratio  Renovascular hypertension -     Comprehensive metabolic panel with GFR -     Microalbumin / creatinine urine ratio  Hyperthyroidism -     TSH  Hyperlipidemia with target LDL less than 70 -     Lipid panel -     LDL cholesterol, direct -     CBC with Differential/Platelet  Microalbuminuria due to type 2 diabetes mellitus (HCC) Assessment & Plan: A1c had risen to 7.2 without metformin  and Jardiance . And he has been lost to  follow up   Would avoid metformin  given his chronic respiratory failure and probable underlying compensated respiratory acidosis .. he has stopped statin  due to quality of life issues but is tolerating ARB   Lab Results  Component Value Date   HGBA1C 7.2 (H) 08/23/2022   Lab Results  Component Value Date   MICROALBUR 155.5 (H) 08/09/2021   MICROALBUR 60.3 (H) 04/07/2020     Lab Results  Component Value Date   CHOL 182 08/23/2022   HDL 62.80 08/23/2022   LDLCALC 99 08/23/2022   LDLDIRECT 99.0 08/23/2022   TRIG 100.0 08/23/2022   CHOLHDL 3 08/23/2022     Orders: -     Microalbumin / creatinine urine ratio  Muscle cramps Assessment & Plan: Diffuse and recurrent , involving both arms and legs. Suspect hyponatremia. Labs pending  Orders: -     CK  Recent unintentional weight loss over several months Assessment & Plan: Need to rule out occult malignancy if thyroid  function is normal   Orders: -     DG Chest 2 View; Future  History of alcohol abuse Assessment & Plan: He has remained abstinent     COPD, moderate (HCC) Assessment & Plan: Continue home nebulizer use for cost savings .  He has declined continued oxygen  supplementation and is aware his prognosis .    Other orders -     amLODIPine  Besylate; Take 1 tablet (10 mg total) by mouth daily.  Dispense: 90 tablet; Refill: 1 -     Metoprolol  Succinate ER; Take 1 tablet (50 mg total) by mouth daily.  Dispense: 90 tablet; Refill: 1 -     Omeprazole ; Take 1 capsule (40 mg total) by mouth daily.  Dispense: 90 capsule; Refill: 1 -     Telmisartan ; TAKE 1 TABLET BY MOUTH EVERYDAY AT BEDTIME  Dispense: 90 tablet; Refill: 1 -     tiZANidine HCl; Take 1 tablet (4 mg total) by mouth every 6 (six) hours as needed for muscle  spasms.  Dispense: 90 tablet; Refill: 0     I spent 34 minutes on the day of this face to face encounter reviewing patient's  most recent visit with cardiology,  nephrology,  and neurology,  prior  relevant surgical and non surgical procedures, recent  labs and imaging studies, counseling on weight management,  reviewing the assessment and plan with patient, and post visit ordering and reviewing of  diagnostics and therapeutics with patient  .   Follow-up: Return in about 4 weeks (around 07/23/2023).   Thersia Flax, MD

## 2023-06-25 NOTE — Assessment & Plan Note (Signed)
 Continue home nebulizer use for cost savings .  He has declined continued oxygen  supplementation and is aware his prognosis .

## 2023-06-25 NOTE — Assessment & Plan Note (Signed)
 Need to rule out occult malignancy if thyroid  function is normal

## 2023-06-26 ENCOUNTER — Ambulatory Visit: Payer: Self-pay | Admitting: Internal Medicine

## 2023-06-26 LAB — CBC WITH DIFFERENTIAL/PLATELET
Basophils Absolute: 0.1 10*3/uL (ref 0.0–0.1)
Basophils Relative: 0.8 % (ref 0.0–3.0)
Eosinophils Absolute: 0.1 10*3/uL (ref 0.0–0.7)
Eosinophils Relative: 0.7 % (ref 0.0–5.0)
HCT: 32.2 % — ABNORMAL LOW (ref 39.0–52.0)
Hemoglobin: 10.7 g/dL — ABNORMAL LOW (ref 13.0–17.0)
Lymphocytes Relative: 11.7 % — ABNORMAL LOW (ref 12.0–46.0)
Lymphs Abs: 1.2 10*3/uL (ref 0.7–4.0)
MCHC: 33.3 g/dL (ref 30.0–36.0)
MCV: 91.1 fl (ref 78.0–100.0)
Monocytes Absolute: 1.1 10*3/uL — ABNORMAL HIGH (ref 0.1–1.0)
Monocytes Relative: 10.9 % (ref 3.0–12.0)
Neutro Abs: 7.5 10*3/uL (ref 1.4–7.7)
Neutrophils Relative %: 75.9 % (ref 43.0–77.0)
Platelets: 402 10*3/uL — ABNORMAL HIGH (ref 150.0–400.0)
RBC: 3.53 Mil/uL — ABNORMAL LOW (ref 4.22–5.81)
RDW: 13.2 % (ref 11.5–15.5)
WBC: 9.9 10*3/uL (ref 4.0–10.5)

## 2023-06-26 LAB — TSH: TSH: 0.52 u[IU]/mL (ref 0.35–5.50)

## 2023-06-26 LAB — COMPREHENSIVE METABOLIC PANEL WITH GFR
ALT: 11 U/L (ref 0–53)
AST: 13 U/L (ref 0–37)
Albumin: 3.5 g/dL (ref 3.5–5.2)
Alkaline Phosphatase: 60 U/L (ref 39–117)
BUN: 28 mg/dL — ABNORMAL HIGH (ref 6–23)
CO2: 27 meq/L (ref 19–32)
Calcium: 9.1 mg/dL (ref 8.4–10.5)
Chloride: 99 meq/L (ref 96–112)
Creatinine, Ser: 1.61 mg/dL — ABNORMAL HIGH (ref 0.40–1.50)
GFR: 37.98 mL/min — ABNORMAL LOW (ref >60.00)
Glucose, Bld: 176 mg/dL — ABNORMAL HIGH (ref 70–99)
Potassium: 4.8 meq/L (ref 3.5–5.1)
Sodium: 132 meq/L — ABNORMAL LOW (ref 135–145)
Total Bilirubin: 0.5 mg/dL (ref 0.2–1.2)
Total Protein: 6.6 g/dL (ref 6.0–8.3)

## 2023-06-26 LAB — MICROALBUMIN / CREATININE URINE RATIO
Creatinine,U: 144.4 mg/dL
Microalb Creat Ratio: 581.8 mg/g — ABNORMAL HIGH (ref 0.0–30.0)
Microalb, Ur: 84 mg/dL — ABNORMAL HIGH (ref 0.0–1.9)

## 2023-06-26 LAB — LIPID PANEL
Cholesterol: 168 mg/dL (ref 0–200)
HDL: 42.1 mg/dL (ref >39.00)
LDL Cholesterol: 103 mg/dL — ABNORMAL HIGH (ref 0–99)
NonHDL: 125.9
Total CHOL/HDL Ratio: 4
Triglycerides: 117 mg/dL (ref 0.0–149.0)
VLDL: 23.4 mg/dL (ref 0.0–40.0)

## 2023-06-26 LAB — CK: Total CK: 29 U/L (ref 7–232)

## 2023-06-26 LAB — LDL CHOLESTEROL, DIRECT: Direct LDL: 103 mg/dL

## 2023-06-26 LAB — HEMOGLOBIN A1C: Hgb A1c MFr Bld: 8.4 % — ABNORMAL HIGH (ref 4.6–6.5)

## 2023-06-27 NOTE — Addendum Note (Signed)
 Addended by: Thersia Flax on: 06/27/2023 01:24 PM   Modules accepted: Orders

## 2023-07-01 ENCOUNTER — Ambulatory Visit: Payer: Self-pay

## 2023-07-01 NOTE — Telephone Encounter (Signed)
 Pt reports 10/10 pain from his feet to his shoulders. Pt states the pain is worse at his shoulders. Pt denies rashes, redness, swelling. Pt states he can walk, albeit with difficulty.  Pt was seen in the office 5/27 and was prescribed tizanidine . Pt states this is ineffective and is interested in a different medication. RN advised pt RN would relay concern to the office.  RN advised pt if he develops CP, SOB, dizziness, weakness, numbness, tingling, or any worsening to call 911. Pt verbalized understanding.    Copied from CRM [id number]. Topic: Clinical - Prescription Issue >> Jul 01, 2023 10:35 AM Melissa C wrote: Reason for CRM: patient was given tiZANidine  (ZANAFLEX ) 4 MG tablet  on 5/27 and stated it is ineffective. Asking that the doctor or nurse give him a call so they can discuss different treatment options. Answer Assessment - Initial Assessment Questions 1. ONSET: "When did the pain start?"     Ongoing for 1 month; pt states he received a cortisone shot, pain has returned  2. LOCATION: "Where is the pain located?"     Shoulder pain, leg pain 3. PAIN: "How bad is the pain?" (Scale 1-10; or mild, moderate, severe)   - MILD (1-3): doesn't interfere with normal activities   - MODERATE (4-7): interferes with normal activities (e.g., work or school) or awakens from sleep   - SEVERE (8-10): excruciating pain, unable to do any normal activities, unable to move arm at all due to pain     10/10 pain 4. WORK OR EXERCISE: "Has there been any recent work or exercise that involved this part of the body?"     No  5. CAUSE: "What do you think is causing the shoulder pain?"     Not sure  6. OTHER SYMPTOMS: "Do you have any other symptoms?" (e.g., neck pain, swelling, rash, fever, numbness, weakness)     Can walk with difficulty. Endorses pain from his feet to his shoulders  Was seen 5/27 - states his pain is no worse, no better  Protocols used: Shoulder Pain-A-AH

## 2023-07-01 NOTE — Telephone Encounter (Signed)
 Pt called to let Dr. Madelon Scheuermann know that the Tizanidine  is not helping with his all over body pain. Pt stated that his pain level is a 10/10. Pt would like to know if something else could be called in.

## 2023-07-02 ENCOUNTER — Telehealth: Payer: Self-pay

## 2023-07-02 NOTE — Telephone Encounter (Signed)
 Copied from CRM [id number]. Topic: Clinical - Medical Advice >> Jul 02, 2023 12:33 PM Taleah C wrote: Reason for CRM: patient called back in and I relayed the message from Dr. Madelon Scheuermann regarding his medication request. Pt verbalized understanding.

## 2023-07-03 DIAGNOSIS — M25811 Other specified joint disorders, right shoulder: Secondary | ICD-10-CM | POA: Diagnosis not present

## 2023-07-03 DIAGNOSIS — M2241 Chondromalacia patellae, right knee: Secondary | ICD-10-CM | POA: Diagnosis not present

## 2023-07-03 DIAGNOSIS — M2242 Chondromalacia patellae, left knee: Secondary | ICD-10-CM | POA: Diagnosis not present

## 2023-07-03 DIAGNOSIS — M25812 Other specified joint disorders, left shoulder: Secondary | ICD-10-CM | POA: Diagnosis not present

## 2023-07-16 ENCOUNTER — Ambulatory Visit (INDEPENDENT_AMBULATORY_CARE_PROVIDER_SITE_OTHER): Admitting: Internal Medicine

## 2023-07-16 ENCOUNTER — Encounter: Payer: Self-pay | Admitting: Internal Medicine

## 2023-07-16 VITALS — BP 130/58 | HR 70 | Ht 73.0 in | Wt 137.2 lb

## 2023-07-16 DIAGNOSIS — E559 Vitamin D deficiency, unspecified: Secondary | ICD-10-CM

## 2023-07-16 DIAGNOSIS — E1121 Type 2 diabetes mellitus with diabetic nephropathy: Secondary | ICD-10-CM | POA: Diagnosis not present

## 2023-07-16 DIAGNOSIS — I129 Hypertensive chronic kidney disease with stage 1 through stage 4 chronic kidney disease, or unspecified chronic kidney disease: Secondary | ICD-10-CM

## 2023-07-16 DIAGNOSIS — N1832 Chronic kidney disease, stage 3b: Secondary | ICD-10-CM

## 2023-07-16 DIAGNOSIS — E871 Hypo-osmolality and hyponatremia: Secondary | ICD-10-CM

## 2023-07-16 DIAGNOSIS — E0590 Thyrotoxicosis, unspecified without thyrotoxic crisis or storm: Secondary | ICD-10-CM

## 2023-07-16 DIAGNOSIS — E1122 Type 2 diabetes mellitus with diabetic chronic kidney disease: Secondary | ICD-10-CM | POA: Diagnosis not present

## 2023-07-16 DIAGNOSIS — Z7984 Long term (current) use of oral hypoglycemic drugs: Secondary | ICD-10-CM | POA: Diagnosis not present

## 2023-07-16 DIAGNOSIS — D631 Anemia in chronic kidney disease: Secondary | ICD-10-CM

## 2023-07-16 DIAGNOSIS — N183 Chronic kidney disease, stage 3 unspecified: Secondary | ICD-10-CM | POA: Diagnosis not present

## 2023-07-16 MED ORDER — EMPAGLIFLOZIN 10 MG PO TABS: 10.0000 mg | ORAL_TABLET | Freq: Every day | ORAL | 0 refills | Status: DC

## 2023-07-16 NOTE — Progress Notes (Signed)
 Subjective:  Patient ID: Terry [name], male    DOB: Jul 25, 1933  Age: 89 y.o. MRN: [id number]  CC: The primary encounter diagnosis was Stage 3b chronic kidney disease (HCC). Diagnoses of Anemia in stage 3b chronic kidney disease (HCC), Hyponatremia, Anemia due to stage 3b chronic kidney disease (HCC), Diabetic nephropathy associated with type 2 diabetes mellitus (HCC), Hyperthyroidism, and Type 2 DM with CKD stage 3 and hypertension (HCC) were also pertinent to this visit.   HPI Terry [name] presents for  Chief Complaint  Patient presents with   discuss labs   1) hyponatremia: new onset noted at last visit   2)  uncontrolled diabetes: has not been taking metformin  or checking blood sugars.    Recommending Jardiance     3) CKD with new onset anemia : taking telmisartan  only.  No nephrology follow up since 2022   4)  received 6 day prednisone  taper from the [facility] walk in clinic in June 4 which  relieved his back pain significantly.  He is not taking tylenol.     Outpatient Medications Prior to Visit  Medication Sig Dispense Refill   albuterol  (VENTOLIN  HFA) 108 (90 Base) MCG/ACT inhaler Inhale 2 puffs into the lungs every 6 (six) hours as needed for wheezing or shortness of breath. 3.7 g 11   amLODipine  (NORVASC ) 10 MG tablet Take 1 tablet (10 mg total) by mouth daily. 90 tablet 1   aspirin  EC 81 MG tablet Take 1 tablet (81 mg total) by mouth daily. 90 tablet 3   b complex vitamins tablet Take 1 tablet by mouth daily.     budesonide  (PULMICORT ) 0.5 MG/2ML nebulizer solution Take 2 mLs (0.5 mg total) by nebulization daily. 60 mL 12   cyanocobalamin  1000 MCG tablet Take 1,000 mcg by mouth daily.     ipratropium-albuterol  (DUONEB) 0.5-2.5 (3) MG/3ML SOLN Take 3 mLs by nebulization every 6 (six) hours as needed. 360 mL 2   methimazole  (TAPAZOLE ) 5 MG tablet TAKE 1 TABLET (5 MG TOTAL) BY MOUTH DAILY. 90 tablet 0   metoprolol  succinate (TOPROL -XL) 50 MG 24 hr tablet Take 1  tablet (50 mg total) by mouth daily. 90 tablet 1   omeprazole  (PRILOSEC) 40 MG capsule Take 1 capsule (40 mg total) by mouth daily. 90 capsule 1   telmisartan  (MICARDIS ) 80 MG tablet TAKE 1 TABLET BY MOUTH EVERYDAY AT BEDTIME 90 tablet 1   sulfamethoxazole -trimethoprim  (BACTRIM  DS) 800-160 MG tablet Take 1 tablet by mouth 2 (two) times daily. 14 tablet 0   tadalafil  (CIALIS ) 5 MG tablet Take 1 tablet (5 mg total) by mouth daily as needed for erectile dysfunction. (Patient not taking: Reported on 07/16/2023) 10 tablet 0   tiZANidine  (ZANAFLEX ) 4 MG tablet Take 1 tablet (4 mg total) by mouth every 6 (six) hours as needed for muscle spasms. 90 tablet 0   Facility-Administered Medications Prior to Visit  Medication Dose Route Frequency Provider Last Rate Last Admin   ipratropium-albuterol  (DUONEB) 0.5-2.5 (3) MG/3ML nebulizer solution 3 mL  3 mL Nebulization Q6H Calista Catching, FNP        Review of Systems;  Patient denies headache, fevers, malaise, unintentional weight loss, skin rash, eye pain, sinus congestion and sinus pain, sore throat, dysphagia,  hemoptysis , cough, dyspnea, wheezing, chest pain, palpitations, orthopnea, edema, abdominal pain, nausea, melena, diarrhea, constipation, flank pain, dysuria, hematuria, urinary  Frequency, nocturia, numbness, tingling, seizures,  Focal weakness, Loss of consciousness,  Tremor, insomnia, depression, anxiety, and suicidal ideation.  Objective:  BP (!) 130/58   Pulse 70   Ht 6' 1 (1.854 m)   Wt 137 lb 3.2 oz (62.2 kg)   SpO2 96%   BMI 18.10 kg/m   BP Readings from Last 3 Encounters:  07/16/23 (!) 130/58  06/25/23 132/70  02/14/23 114/68    Wt Readings from Last 3 Encounters:  07/16/23 137 lb 3.2 oz (62.2 kg)  06/25/23 138 lb (62.6 kg)  02/14/23 141 lb 12.8 oz (64.3 kg)    Physical Exam Vitals reviewed.  Constitutional:      General: He is not in acute distress.    Appearance: Normal appearance. He is underweight. He is  not ill-appearing, toxic-appearing or diaphoretic.  HENT:     Head: Normocephalic.   Eyes:     General: No scleral icterus.       Right eye: No discharge.        Left eye: No discharge.     Conjunctiva/sclera: Conjunctivae normal.    Cardiovascular:     Rate and Rhythm: Normal rate and regular rhythm.     Heart sounds: Normal heart sounds.  Pulmonary:     Effort: Pulmonary effort is normal. No respiratory distress.     Breath sounds: Decreased air movement present. Examination of the right-upper field reveals decreased breath sounds. Examination of the left-upper field reveals decreased breath sounds. Examination of the right-middle field reveals decreased breath sounds. Examination of the left-middle field reveals decreased breath sounds. Examination of the right-lower field reveals decreased breath sounds. Examination of the left-lower field reveals decreased breath sounds. Decreased breath sounds present. No wheezing, rhonchi or rales.   Musculoskeletal:        General: Normal range of motion.     Cervical back: Normal range of motion.   Skin:    General: Skin is warm and dry.   Neurological:     General: No focal deficit present.     Mental Status: He is alert and oriented to person, place, and time. Mental status is at baseline.   Psychiatric:        Mood and Affect: Mood normal.        Behavior: Behavior normal.        Thought Content: Thought content normal.        Judgment: Judgment normal.    Lab Results  Component Value Date   HGBA1C 8.4 (H) 06/25/2023   HGBA1C 7.2 (H) 08/23/2022   HGBA1C 6.6 (H) 08/09/2021    Lab Results  Component Value Date   CREATININE 1.61 (H) 06/25/2023   CREATININE 1.63 (H) 08/23/2022   CREATININE 1.29 08/09/2021    Lab Results  Component Value Date   WBC 9.9 06/25/2023   HGB 10.7 (L) 06/25/2023   HCT 32.2 (L) 06/25/2023   PLT 402.0 (H) 06/25/2023   GLUCOSE 176 (H) 06/25/2023   CHOL 168 06/25/2023   TRIG 117.0 06/25/2023    HDL 42.10 06/25/2023   LDLDIRECT 103.0 06/25/2023   LDLCALC 103 (H) 06/25/2023   ALT 11 06/25/2023   AST 13 06/25/2023   NA 132 (L) 06/25/2023   K 4.8 06/25/2023   CL 99 06/25/2023   CREATININE 1.61 (H) 06/25/2023   BUN 28 (H) 06/25/2023   CO2 27 06/25/2023   TSH 0.52 06/25/2023   PSA 0.75 11/12/2013   INR 0.9 12/12/2012   HGBA1C 8.4 (H) 06/25/2023   MICROALBUR 84.0 (H) 06/25/2023    US  Abdomen Complete Result Date: 10/23/2016 CLINICAL DATA:  Weight loss, abdominal  pain EXAM: ABDOMEN ULTRASOUND COMPLETE COMPARISON:  CT 08/15/2016 FINDINGS: Gallbladder: No gallstones or wall thickening visualized. No sonographic Murphy sign noted by sonographer. Common bile duct: Diameter: Normal caliber, 3 mm. Liver: No focal lesion identified. Within normal limits in parenchymal echogenicity. Portal vein is patent on color Doppler imaging with normal direction of blood flow towards the liver. IVC: No abnormality visualized. Pancreas: Not well visualized due to overlying bowel gas. Spleen: Size and appearance within normal limits. Right Kidney: Length: 11.0 cm. 2.7 cm cyst off the lower pole. No hydronephrosis. Normal echotexture. Left Kidney: Length: 11.3 cm. Echogenicity within normal limits. No mass or hydronephrosis visualized. Abdominal aorta: No aneurysm visualized. Other findings: None. IMPRESSION: No acute findings or significant abnormality. Electronically Signed   By: Janeece Mechanic M.D.   On: 10/23/2016 09:13   DG Chest 2 View Result Date: 10/23/2016 CLINICAL DATA:  Right lower chest pain for 2 months, some weight loss, history of COPD EXAM: CHEST  2 VIEW COMPARISON:  Chest x-ray of 10/02/2012 FINDINGS: The lungs remain clear but hyperaerated suggestive of mild emphysematous change. Mediastinal and hilar contours are unremarkable. Minimal basilar linear scarring is present. No pneumonia or effusion is seen. The heart is within normal limits in size. Only mild degenerative changes present in the lower  thoracic spine for age. IMPRESSION: Hyperaeration may indicate mild emphysematous change. No active lung disease. Electronically Signed   By: Gerrie Krebs M.D.   On: 10/23/2016 08:39    Assessment & Plan:  .Stage 3b chronic kidney disease (HCC) -     PTH, intact and calcium ; Future -     VITAMIN D  25 Hydroxy (Vit-D Deficiency, Fractures); Future  Anemia in stage 3b chronic kidney disease (HCC) -     Iron, TIBC and Ferritin Panel; Future -     B12 and Folate Panel; Future  Hyponatremia -     Basic metabolic panel with GFR; Future  Anemia due to stage 3b chronic kidney disease (HCC) Assessment & Plan: Mild normocytic,  and improved  in the past with supplementation of B12 and restriction of alcohol. normal colonoscopy in 2018,  EGD noted gastritis and PPI was increased to  bid transiently.  Given his low GFR  will screen for MM and new deficiencies  Orders: -     Protein electrophoresis, serum -     Immunofixation, urine  Diabetic nephropathy associated with type 2 diabetes mellitus (HCC) Assessment & Plan: Starting jardiance  today at 10 mg daily x 30 days.  Risks and benefits of medication discussed   Hyperthyroidism Assessment & Plan: Previously managed by  Endicrinology,  with methimazole , until her departure. TSH is at goal .  Continue current dose of  5 mg    Lab Results  Component Value Date   TSH 0.52 06/25/2023      Type 2 DM with CKD stage 3 and hypertension (HCC) Assessment & Plan: No longer a candidate for metformin  due to weight loss and GFR,  adding jardiance .  Will have him return for CBG monitor placement  Lab Results  Component Value Date   HGBA1C 8.4 (H) 06/25/2023      Other orders -     Empagliflozin ; Take 1 tablet (10 mg total) by mouth daily before breakfast.  Dispense: 30 tablet; Refill: 0     Follow-up: No follow-ups on file.   Thersia Flax, MD

## 2023-07-16 NOTE — Patient Instructions (Addendum)
 You can use 1000 mg tylenol every 8 hours every  day for arthritis  pain    I CAN REPEAT THE PREDNSIONE TAPER IN 3 MONTHS IF NEEDED  I AM STARTING YOU ON JARDIANCE  TO LOWER YOUR BLOOD SUGARS AND PROTECT YOUR KIDNEYS  RETURN FOR ADDITIONAL LABS   REFERRAL TO NEPHROLOGY IN PROCESS

## 2023-07-17 DIAGNOSIS — M25562 Pain in left knee: Secondary | ICD-10-CM | POA: Diagnosis not present

## 2023-07-17 DIAGNOSIS — M25561 Pain in right knee: Secondary | ICD-10-CM | POA: Diagnosis not present

## 2023-07-17 NOTE — Assessment & Plan Note (Addendum)
 No longer a candidate for metformin  due to weight loss and GFR,  adding jardiance .  Will have him return for CBG monitor placement  Lab Results  Component Value Date   HGBA1C 8.4 (H) 06/25/2023

## 2023-07-17 NOTE — Assessment & Plan Note (Signed)
 Starting jardiance  today at 10 mg daily x 30 days.  Risks and benefits of medication discussed

## 2023-07-17 NOTE — Assessment & Plan Note (Signed)
 Previously managed by  Endicrinology,  with methimazole , until her departure. TSH is at goal .  Continue current dose of  5 mg    Lab Results  Component Value Date   TSH 0.52 06/25/2023

## 2023-07-17 NOTE — Assessment & Plan Note (Signed)
 Mild normocytic,  and improved  in the past with supplementation of B12 and restriction of alcohol. normal colonoscopy in 2018,  EGD noted gastritis and PPI was increased to  bid transiently.  Given his low GFR  will screen for MM and new deficiencies

## 2023-07-18 ENCOUNTER — Other Ambulatory Visit (INDEPENDENT_AMBULATORY_CARE_PROVIDER_SITE_OTHER)

## 2023-07-18 DIAGNOSIS — N1832 Chronic kidney disease, stage 3b: Secondary | ICD-10-CM | POA: Diagnosis not present

## 2023-07-18 DIAGNOSIS — E871 Hypo-osmolality and hyponatremia: Secondary | ICD-10-CM | POA: Diagnosis not present

## 2023-07-18 DIAGNOSIS — D631 Anemia in chronic kidney disease: Secondary | ICD-10-CM | POA: Diagnosis not present

## 2023-07-18 NOTE — Addendum Note (Signed)
 Addended by: Lindle Rhea on: 07/18/2023 02:24 PM   Modules accepted: Orders

## 2023-07-18 NOTE — Addendum Note (Signed)
 Addended by: Thressa Flora D on: 07/18/2023 02:26 PM   Modules accepted: Orders

## 2023-07-19 LAB — PROTEIN ELECTROPHORESIS, SERUM
A/G Ratio: 1 (ref 0.7–1.7)
Albumin ELP: 2.9 g/dL (ref 2.9–4.4)
Alpha 1: 0.3 g/dL (ref 0.0–0.4)
Alpha 2: 1.1 g/dL — ABNORMAL HIGH (ref 0.4–1.0)
Beta: 0.8 g/dL (ref 0.7–1.3)

## 2023-07-19 LAB — PTH, INTACT AND CALCIUM
Calcium: 8.7 mg/dL (ref 8.6–10.3)
PTH: 62 pg/mL (ref 16–77)

## 2023-07-19 LAB — BASIC METABOLIC PANEL WITH GFR
BUN: 25 mg/dL — ABNORMAL HIGH (ref 6–23)
CO2: 25 meq/L (ref 19–32)
Calcium: 8.7 mg/dL (ref 8.4–10.5)
Chloride: 104 meq/L (ref 96–112)
Creatinine, Ser: 1.45 mg/dL (ref 0.40–1.50)
GFR: 43.04 mL/min — ABNORMAL LOW (ref >60.00)
Glucose, Bld: 169 mg/dL — ABNORMAL HIGH (ref 70–99)
Potassium: 4.5 meq/L (ref 3.5–5.1)
Sodium: 138 meq/L (ref 135–145)

## 2023-07-19 LAB — B12 AND FOLATE PANEL
Folate: 7.2 ng/mL (ref >5.9)
Vitamin B-12: 304 pg/mL (ref 211–911)

## 2023-07-19 LAB — VITAMIN D 25 HYDROXY (VIT D DEFICIENCY, FRACTURES): VITD: 20.8 ng/mL — ABNORMAL LOW (ref 30.00–100.00)

## 2023-07-19 LAB — IRON,TIBC AND FERRITIN PANEL
%SAT: 29 % (ref 20–48)
Ferritin: 395 ng/mL — ABNORMAL HIGH (ref 24–380)
Iron: 53 ug/dL (ref 50–180)
TIBC: 183 ug/dL — ABNORMAL LOW (ref 250–425)

## 2023-07-21 ENCOUNTER — Ambulatory Visit: Payer: Self-pay | Admitting: Internal Medicine

## 2023-07-21 DIAGNOSIS — E559 Vitamin D deficiency, unspecified: Secondary | ICD-10-CM | POA: Insufficient documentation

## 2023-07-21 MED ORDER — ERGOCALCIFEROL 1.25 MG (50000 UT) PO CAPS: 50000.0000 [IU] | ORAL_CAPSULE | ORAL | 0 refills | Status: DC

## 2023-07-21 NOTE — Addendum Note (Signed)
 Addended by: MARYLYNN VERNEITA CROME on: 07/21/2023 10:48 PM   Modules accepted: Orders

## 2023-07-22 LAB — PROTEIN ELECTROPHORESIS, SERUM
Gamma Globulin: 0.8 g/dL (ref 0.4–1.8)
Globulin, Total: 3 g/dL (ref 2.2–3.9)
Total Protein: 5.9 g/dL — ABNORMAL LOW (ref 6.0–8.5)

## 2023-07-22 LAB — IMMUNOFIXATION, URINE

## 2023-07-24 ENCOUNTER — Ambulatory Visit: Payer: PPO | Admitting: *Deleted

## 2023-07-24 VITALS — Ht 73.0 in | Wt 139.0 lb

## 2023-07-24 DIAGNOSIS — Z0000 Encounter for general adult medical examination without abnormal findings: Secondary | ICD-10-CM | POA: Diagnosis not present

## 2023-07-24 NOTE — Patient Instructions (Signed)
 Terry [name] , Thank you for taking time out of your busy schedule to complete your Annual Wellness Visit with me. I enjoyed our conversation and look forward to speaking with you again next year. I, as well as your care team,  appreciate your ongoing commitment to your health goals. Please review the following plan we discussed and let me know if I can assist you in the future. Your Game plan/ To Do List    Referrals: If you haven't heard from the office you've been referred to, please reach out to them at the phone provided.  Remember to call and schedule a diabetic eye exam and update your tetanus vaccine. Follow up Visits: Next Medicare AWV with our clinical staff: 07/28/24 @ 3:00   Have you seen your provider in the last 6 months (3 months if uncontrolled diabetes)? Yes Next Office Visit with your provider: 01/15/24  Clinician Recommendations:  Aim for 30 minutes of exercise or brisk walking, 6-8 glasses of water, and 5 servings of fruits and vegetables each day.       This is a list of the screening recommended for you and due dates:  Health Maintenance  Topic Date Due   Eye exam for diabetics  10/24/2021   DTaP/Tdap/Td vaccine (2 - Td or Tdap) 02/26/2023   COVID-19 Vaccine (4 - 2024-25 season) 08/01/2023*   Flu Shot  08/30/2023   Hemoglobin A1C  12/26/2023   Complete foot exam   07/15/2024   Medicare Annual Wellness Visit  07/23/2024   Pneumococcal Vaccine for age over 45  Completed   Hepatitis B Vaccine  Aged Out   HPV Vaccine  Aged Out   Meningitis B Vaccine  Aged Out   Zoster (Shingles) Vaccine  Discontinued  *Topic was postponed. The date shown is not the original due date.    Advanced directives: (Copy Requested) Please bring a copy of your health care power of attorney and living will to the office to be added to your chart at your convenience. You can mail to [facility] [address] [place], [place] [address] or email to  [email or web address] Advance Care Planning is important because it:  [x]  Makes sure you receive the medical care that is consistent with your values, goals, and preferences  [x]  It provides guidance to your family and loved ones and reduces their decisional burden about whether or not they are making the right decisions based on your wishes.

## 2023-07-24 NOTE — Progress Notes (Signed)
 Subjective:   Terry [name] is a 89 y.o. who presents for a Medicare Wellness preventive visit.  As a reminder, Annual Wellness Visits don't include a physical exam, and some assessments may be limited, especially if this visit is performed virtually. We may recommend an in-person follow-up visit with your provider if needed.  Visit Complete: Virtual I connected with  Terry [name] on 07/24/23 by a audio enabled telemedicine application and verified that I am speaking with the correct person using two identifiers.  Patient Location: Home  Provider Location: Home Office  I discussed the limitations of evaluation and management by telemedicine. The patient expressed understanding and agreed to proceed.  Vital Signs: Because this visit was a virtual/telehealth visit, some criteria may be missing or patient reported. Any vitals not documented were not able to be obtained and vitals that have been documented are patient reported.  VideoDeclined- This patient declined Librarian, academic. Therefore the visit was completed with audio only.  Persons Participating in Visit: Patient.  AWV Questionnaire: No: Patient Medicare AWV questionnaire was not completed prior to this visit.  Cardiac Risk Factors include: advanced age (>8men, >84 women);diabetes mellitus;male gender;dyslipidemia;hypertension     Objective:    Today's Vitals   07/24/23 1507  Weight: 139 lb (63 kg)  Height: 6' 1 (1.854 m)   Body mass index is 18.34 kg/m.     07/24/2023    3:25 PM 07/23/2022    3:17 PM 07/11/2021    2:00 PM 03/23/2020    9:14 AM 03/23/2019    9:15 AM 03/18/2018   12:18 PM 11/06/2016    3:22 PM  Advanced Directives  Does Patient Have a Medical Advance Directive? Yes Yes Yes Yes Yes Yes  Yes   Type of Estate agent of [place];Living will Healthcare Power of [place];Living will Out of facility DNR (pink MOST or yellow form)   Living will  Healthcare Power of [place];Living will  Does patient want to make changes to medical advance directive?    No - Patient declined No - Patient declined No - Patient declined  No - Patient declined   Copy of Healthcare Power of Attorney in Chart? No - copy requested No - copy requested     No - copy requested      Data saved with a previous flowsheet row definition    Current Medications (verified) Outpatient Encounter Medications as of 07/24/2023  Medication Sig   albuterol  (VENTOLIN  HFA) 108 (90 Base) MCG/ACT inhaler Inhale 2 puffs into the lungs every 6 (six) hours as needed for wheezing or shortness of breath.   amLODipine  (NORVASC ) 10 MG tablet Take 1 tablet (10 mg total) by mouth daily.   aspirin  EC 81 MG tablet Take 1 tablet (81 mg total) by mouth daily.   b complex vitamins tablet Take 1 tablet by mouth daily.   budesonide  (PULMICORT ) 0.5 MG/2ML nebulizer solution Take 2 mLs (0.5 mg total) by nebulization daily.   cyanocobalamin  1000 MCG tablet Take 1,000 mcg by mouth daily.   ipratropium-albuterol  (DUONEB) 0.5-2.5 (3) MG/3ML SOLN Take 3 mLs by nebulization every 6 (six) hours as needed.   methimazole  (TAPAZOLE ) 5 MG tablet TAKE 1 TABLET (5 MG TOTAL) BY MOUTH DAILY.   metoprolol  succinate (TOPROL -XL) 50 MG 24 hr tablet Take 1 tablet (50 mg total) by mouth daily.   omeprazole  (PRILOSEC) 40 MG capsule Take 1 capsule (40 mg total) by mouth daily.   telmisartan  (MICARDIS ) 80 MG tablet  TAKE 1 TABLET BY MOUTH EVERYDAY AT BEDTIME   empagliflozin  (JARDIANCE ) 10 MG TABS tablet Take 1 tablet (10 mg total) by mouth daily before breakfast. (Patient not taking: Reported on 07/24/2023)   ergocalciferol  (DRISDOL) 1.25 MG (50000 UT) capsule Take 1 capsule (50,000 Units total) by mouth once a week. (Patient not taking: Reported on 07/24/2023)   Facility-Administered Encounter Medications as of 07/24/2023  Medication   ipratropium-albuterol  (DUONEB) 0.5-2.5 (3) MG/3ML nebulizer solution 3 mL     Allergies (verified) Patient has no known allergies.   History: Past Medical History:  Diagnosis Date   Anemia    COPD (chronic obstructive pulmonary disease) (HCC)    Diabetes mellitus 2008   GERD (gastroesophageal reflux disease)    Hyperlipidemia    Hypertension    hyperthyroidism    Squamous cell carcinoma of skin of left upper arm November 2015   Excised to negative margins, associated keratoacanthoma.   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 Christ. He is due for followup colonoscopy    Past Surgical History:  Procedure Laterality Date   APPENDECTOMY  1970   ARM SKIN LESION BIOPSY / EXCISION Left 2016   CATARACT EXTRACTION, BILATERAL  2012   Porfilio   COLONOSCOPY  12-10-12   COLONOSCOPY W/ POLYPECTOMY  2011   Dr. Eartha   COLONOSCOPY WITH PROPOFOL  N/A 09/19/2016   Procedure: COLONOSCOPY WITH PROPOFOL ;  Surgeon: Dessa Reyes ORN, MD;  Location: [facility] ENDOSCOPY;  Service: Endoscopy;  Laterality: N/A;   ESOPHAGOGASTRODUODENOSCOPY (EGD) WITH PROPOFOL  N/A 09/19/2016   Procedure: ESOPHAGOGASTRODUODENOSCOPY (EGD) WITH PROPOFOL ;  Surgeon: Dessa Reyes ORN, MD;  Location: [facility] ENDOSCOPY;  Service: Endoscopy;  Laterality: N/A;   Family History  Problem Relation Age of Onset   Hypertension Father    Diabetes Maternal Aunt    Hyperlipidemia Maternal Uncle    Birth defects Neg Hx    Social History   Socioeconomic History   Marital status: Married    Spouse name: Not on file   Number of children: Not on file   Years of education: Not on file   Highest education level: Not on file  Occupational History   Not on file  Tobacco Use   Smoking status: Former    Current packs/day: 0.00    Average packs/day: 2.0 packs/day for 50.0 years (100.0 ttl pk-yrs)    Types: Cigarettes    Start date: 06/25/1949    Quit date: 06/26/1999    Years since quitting: 24.0   Smokeless tobacco: Never  Vaping Use   Vaping status: Never Used   Substance and Sexual Activity   Alcohol use: Not Currently   Drug use: No   Sexual activity: Yes  Other Topics Concern   Not on file  Social History Narrative   Not on file   Social Drivers of Health   Financial Resource Strain: Low Risk  (07/24/2023)   Overall Financial Resource Strain (CARDIA)    Difficulty of Paying Living Expenses: Not hard at all  Food Insecurity: No Food Insecurity (07/24/2023)   Hunger Vital Sign    Worried About Running Out of Food in the Last Year: Never true    Ran Out of Food in the Last Year: Never true  Transportation Needs: No Transportation Needs (07/24/2023)   PRAPARE - Administrator, Civil Service (Medical): No    Lack of Transportation (Non-Medical): No  Physical Activity: Sufficiently Active (07/24/2023)   Exercise Vital Sign    Days  of Exercise per Week: 7 days    Minutes of Exercise per Session: 30 min  Stress: No Stress Concern Present (07/24/2023)   [employer] of Occupational Health - Occupational Stress Questionnaire    Feeling of Stress: Only a little  Social Connections: Moderately Isolated (07/24/2023)   Social Connection and Isolation Panel    Frequency of Communication with Friends and Family: More than three times a week    Frequency of Social Gatherings with Friends and Family: Three times a week    Attends Religious Services: Never    Active Member of Clubs or Organizations: No    Attends Engineer, structural: Never    Marital Status: Married    Tobacco Counseling Counseling given: Not Answered    Clinical Intake:  Pre-visit preparation completed: Yes  Pain : No/denies pain     BMI - recorded: 18.34 Nutritional Status: BMI <19  Underweight Nutritional Risks: None Diabetes: Yes CBG done?: No Did pt. bring in CBG monitor from home?: No  Lab Results  Component Value Date   HGBA1C 8.4 (H) 06/25/2023   HGBA1C 7.2 (H) 08/23/2022   HGBA1C 6.6 (H) 08/09/2021     How often do you need to  have someone help you when you read instructions, pamphlets, or other written materials from your doctor or pharmacy?: 1 - Never  Interpreter Needed?: No  Information entered by :: R. Nathin Saran LPN   Activities of Daily Living     07/24/2023    3:08 PM  In your present state of health, do you have any difficulty performing the following activities:  Hearing? 0  Vision? 0  Comment glasses  Difficulty concentrating or making decisions? 0  Walking or climbing stairs? 1  Dressing or bathing? 0  Doing errands, shopping? 0  Preparing Food and eating ? N  Using the Toilet? N  In the past six months, have you accidently leaked urine? N  Do you have problems with loss of bowel control? N  Managing your Medications? N  Managing your Finances? N  Housekeeping or managing your Housekeeping? N    Patient Care Team: Marylynn Verneita CROME, MD as PCP - General (Internal Medicine) Dessa Reyes ORN, MD (General Surgery) Marylynn Verneita CROME, MD (Internal Medicine) Darron Deatrice LABOR, MD as Consulting Physician (Cardiology)  I have updated your Care Teams any recent Medical Services you may have received from other providers in the past year.     Assessment:   This is a routine wellness examination for Terry [name].  Hearing/Vision screen Hearing Screening - Comments:: No issues Vision Screening - Comments:: glasses   Goals Addressed             This Visit's Progress    Patient Stated       Wants to work on his appetite       Depression Screen     07/24/2023    3:19 PM 06/25/2023    3:53 PM 02/14/2023   10:33 AM 07/23/2022    3:20 PM 02/02/2022    4:53 PM 11/10/2021    3:11 PM 08/09/2021    3:50 PM  PHQ 2/9 Scores  PHQ - 2 Score 0 3 0 0 0 0 0  PHQ- 9 Score 0 7 0 0       Fall Risk     07/24/2023    3:11 PM 02/14/2023   10:33 AM 07/23/2022    3:15 PM 02/02/2022    4:53 PM 11/10/2021    3:11 PM  Fall Risk   Falls in the past year? 0 1 0 0 0  Number falls in past yr: 0 0 0    Injury with  Fall? 0 1 0    Risk for fall due to : No Fall Risks History of fall(s) No Fall Risks No Fall Risks No Fall Risks  Follow up Falls evaluation completed;Falls prevention discussed Falls evaluation completed Falls evaluation completed;Falls prevention discussed Falls evaluation completed  Falls evaluation completed      Data saved with a previous flowsheet row definition    MEDICARE RISK AT HOME:  Medicare Risk at Home Any stairs in or around the home?: No If so, are there any without handrails?: No Home free of loose throw rugs in walkways, pet beds, electrical cords, etc?: No (discussed get rubber backing or get rid of) Adequate lighting in your home to reduce risk of falls?: Yes Life alert?: No Use of a cane, walker or w/c?: No Grab bars in the bathroom?: Yes Shower chair or bench in shower?: No Elevated toilet seat or a handicapped toilet?: Yes  TIMED UP AND GO:  Was the test performed?  No  Cognitive Function: 6CIT completed    11/06/2016    3:36 PM  MMSE - Mini Mental State Exam  Orientation to time 5   Orientation to Place 5   Registration 3   Attention/ Calculation 5   Recall 3   Language- name 2 objects 2   Language- repeat 1  Language- follow 3 step command 3   Language- read & follow direction 1   Write a sentence 1   Copy design 1   Total score 30      Data saved with a previous flowsheet row definition        07/24/2023    3:26 PM 07/23/2022    3:18 PM 03/23/2019    9:27 AM 03/18/2018   12:28 PM  6CIT Screen  What Year? 0 points 0 points 0 points 0 points  What month? 0 points 0 points 0 points 0 points  What time? 0 points 0 points 0 points 0 points  Count back from 20 0 points 2 points  0 points  Months in reverse 0 points 0 points 0 points 0 points  Repeat phrase 0 points 0 points  0 points  Total Score 0 points 2 points  0 points    Immunizations Immunization History  Administered Date(s) Administered   Fluad Quad(high Dose 65+) 12/23/2018,  12/17/2019, 11/02/2020, 11/10/2021   Influenza, High Dose Seasonal PF 01/17/2015, 11/06/2016, 10/08/2017   Influenza,inj,Quad PF,6+ Mos 10/02/2012, 11/12/2013   Influenza-Unspecified 10/30/2015   PFIZER(Purple Top)SARS-COV-2 Vaccination 03/27/2019, 04/22/2019, 11/30/2019   PNEUMOCOCCAL CONJUGATE-20 11/02/2020   Pneumococcal Conjugate-13 11/12/2013   Pneumococcal Polysaccharide-23 10/02/2012   Tdap 02/25/2013    Screening Tests Health Maintenance  Topic Date Due   OPHTHALMOLOGY EXAM  10/24/2021   DTaP/Tdap/Td (2 - Td or Tdap) 02/26/2023   COVID-19 Vaccine (4 - 2024-25 season) 08/01/2023 (Originally 09/30/2022)   INFLUENZA VACCINE  08/30/2023   HEMOGLOBIN A1C  12/26/2023   FOOT EXAM  07/15/2024   Medicare Annual Wellness (AWV)  07/23/2024   Pneumococcal Vaccine: 50+ Years  Completed   Hepatitis B Vaccines  Aged Out   HPV VACCINES  Aged Out   Meningococcal B Vaccine  Aged Out   Zoster Vaccines- Shingrix  Discontinued    Health Maintenance  Health Maintenance Due  Topic Date Due   OPHTHALMOLOGY EXAM  10/24/2021  DTaP/Tdap/Td (2 - Td or Tdap) 02/26/2023   Health Maintenance Items Addressed: Discussed the need to update Tetanus vaccine. Patient declines shingles vaccines  Additional Screening:  Vision Screening: Recommended annual ophthalmology exams for early detection of glaucoma and other disorders of the eye. Overdue Patient stated that he will call [facility] and schedule a diabetic eye exam. Would you like a referral to an eye doctor? No    Dental Screening: Recommended annual dental exams for proper oral hygiene  Community Resource Referral / Chronic Care Management: CRR required this visit?  No   CCM required this visit?  No   Plan:    I have personally reviewed and noted the following in the patient's chart:   Medical and social history Use of alcohol, tobacco or illicit drugs  Current medications and supplements including opioid prescriptions. Patient  is not currently taking opioid prescriptions. Functional ability and status Nutritional status Physical activity Advanced directives List of other physicians Hospitalizations, surgeries, and ER visits in previous 12 months Vitals Screenings to include cognitive, depression, and falls Referrals and appointments  In addition, I have reviewed and discussed with patient certain preventive protocols, quality metrics, and best practice recommendations. A written personalized care plan for preventive services as well as general preventive health recommendations were provided to patient.   Angeline Fredericks, LPN   [id number]/[id number]   After Visit Summary: (MyChart) Due to this being a telephonic visit, the after visit summary with patients personalized plan was offered to patient via MyChart   Notes: Nothing significant to report at this time.

## 2023-08-06 NOTE — Progress Notes (Signed)
 Patient Name: Terry [name], male   Patient DOB: 05-07-1933 Date of Service: 08/07/2023  Patient MRN: [id number] Provider Creating Note: Saralee Stank, MD  [phone] Primary Care Physician: Marylynn Boyer, MD  [address] [place] [address] Additional Physicians/ Providers:   Chief Complaint   Chief Complaint  Patient presents with  . New    History of Present Illness Terry [name] is a 89 y.o. 1-White male  Diabetes mellitus - 2 Dx in 1972 HTN Hyperthyroidism Diverticulosis Kidney stones BPH with bladder wall thickening. Coronary calcification/coronary artery disease  Gross Hematuria: no NSAIDs: no Family Hx of Kidney disease: no iv contrast exposure: 2018 Smoking: former; stopped in age 40's  Work: building industry =========================================  Patient is seen as new evaluation today for chronic kidney disease.  He is referred by Dr. Tullo for evaluation. He states that he did not used to go for medical evaluation until about age 89.  He was diagnosed with diabetes about 10 to 15 years ago.  He reports that his diabetes control has been fairly good until about last year.  His wife has developed dementia and he is primary caretaker.  He has stress at home due to her illness.  He also has right shoulder pain for which he received cortisone injection. He is experiencing weight loss.  He states his weight was in the 150s last year and now he is down to 136 pounds.  Review of labs from referring office indicate that on July 18, 2023 creatinine was 1.45, GFR 43.  SPEP is negative for M spike.  His creatinine seems to range between 1.3 in 2023 to 1.6 in 2024 and 2025 with corresponding GFR of 38-43. His last A1c was 8.4% from 06/25/2022 which appears to be worsening over time when compared to results from 2020 and 2022.  Today he does not have any acute complaints.  He does not have any leg edema.  His blood pressure is 123/74 sitting and 115/76 standing.  He had a  CT scan of abdomen in 2018 which showed multiple small bilateral renal calculi.  Moderate to diffuse bladder wall thickening likely from bladder outlet obstruction from moderately enlarged prostate glands.  He used to be on tamsulosin  but not taking that anymore.  He does not report any frequency or difficulty with urination at this time.  He reports that he has never passed a kidney stone.    Medications   Current Outpatient Medications:  .  albuterol  HFA (PROVENTIL  HFA;VENTOLIN  HFA) 108 (90 Base) MCG/ACT inhaler, Inhale 2 puffs into the lungs every 6 (six) hours as needed for wheezing or shortness of breath., Disp: , Rfl:  .  aspirin  (ST JOSEPH) 81 MG EC tablet, Take 81 mg by mouth 1 (one) time each day, Disp: , Rfl:  .  budesonide  (PULMICORT ) 0.5 MG/2ML nebulizer solution, Take 2 mL by nebulization 1 (one) time each day, Disp: , Rfl:  .  cyanocobalamin  (VITAMIN B-12) 1000 MCG tablet, Take 1,000 mcg by mouth 1 (one) time each day, Disp: , Rfl:  .  Empagliflozin  10 MG tablet, Take 1 tablet (10 mg total) by mouth daily before breakfast., Disp: , Rfl:  .  ergocalciferol  1.25 MG (50000 UT) capsule, Take 50,000 Units by mouth 1 (one) time per week, Disp: , Rfl:  .  ipratropium-albuterol  (DUO-NEB) 0.5-2.5 mg/3 mL nebulizer solution, Inhale 3 mL every 6 (six) hours if needed, Disp: , Rfl:  .  methIMAzole  (TAPAZOLE ) 5 MG tablet, Take 1 tablet by mouth 1 (  one) time each day, Disp: , Rfl:  .  telmisartan  (MICARDIS ) 80 MG tablet, Take 80 mg by mouth 1 (one) time each day in the evening, Disp: , Rfl:  .  amLODIPine  (NORVASC ) 10 MG tablet, Take 1 tablet by mouth 1 (one) time each day, Disp: , Rfl:  .  b complex vitamins capsule, Take 1 capsule by mouth 1 (one) time each day, Disp: , Rfl:  .  metoprolol  succinate XL (TOPROL  XL) 50 MG 24 hr tablet, Take 50 mg by mouth 1 (one) time each day, Disp: , Rfl:  .  omeprazole  (PriLOSEC) 40 MG DR capsule, Take 1 tablet by mouth 1 (one) time each day, Disp: , Rfl:  .   tiZANidine  (ZANAFLEX ) 4 MG tablet, TAKE 1 TABLET BY MOUTH EVERY 6 HOURS AS NEEDED FOR MUSCLE SPASMS., Disp: , Rfl:    Allergies Patient has no known allergies.  Problem List There is no problem list on file for this patient.   Review of Systems  Constitutional:  Negative for chills and fever.  HENT:  Negative for congestion, ear pain, hearing loss and sore throat.   Eyes:  Negative for pain and discharge.  Respiratory:  Negative for cough, shortness of breath and wheezing.   Cardiovascular:  Negative for chest pain, palpitations and leg swelling.  Gastrointestinal:  Negative for abdominal pain, blood in stool, constipation, diarrhea, nausea and vomiting.  Genitourinary:  Negative for dysuria, frequency, hematuria and urgency.  Musculoskeletal:  Negative for back pain, myalgias and neck pain.  Skin:  Negative for rash.  Neurological:  Negative for dizziness, tremors and headaches.  Endo/Heme/Allergies:  Negative for polydipsia. Does not bruise/bleed easily.     History Past Medical History:  Diagnosis Date  . Adenomatous polyp of colon   . Anemia   . Chronic obstructive pulmonary disease (HCC)   . Diabetes mellitus (HCC)   . Gastroesophageal reflux disease   . Hyperlipidemia   . Hypertension   . Hyperthyroidism   . Squamous cell carcinoma of skin     Past Surgical History:  Procedure Laterality Date  . APPENDECTOMY  1970  . CATARACT EXTRACTION BILATERAL W/ ANTERIOR VITRECTOMY  2012  . COLONOSCOPY  2011   12/10/2012, 09/19/2016  . ESOPHAGOGASTRODUODENOSCOPY  09/19/2016   Family History  Problem Relation Age of Onset  . Hypertension Mother    Social History   Tobacco Use  . Smoking status: Former    Current packs/day: 0.00    Types: Cigarettes    Quit date: 2002    Years since quitting: 23.5  . Smokeless tobacco: Never  Substance Use Topics  . Alcohol use: Not Currently    Comment: occasional    Physical Exam  Vitals BP 115/76 (BP Location: Left upper arm,  Patient Position: Standing)   Pulse (!) 114   Temp 98.3 F   Wt 136 lb (61.7 kg)   SpO2 92%   BMI 17.94 kg/m   Vitals reviewed. Constitutional: No distress.  Cardiovascular:  Normal rate.  An irregularly irregular rhythm present.          Murmur heard.He exhibits no edema.  Pulmonary/Chest: Effort normal and breath sounds normal. No respiratory distress.  Abdominal: Soft. There is no abdominal tenderness. No hernia.  Skin: Skin is warm and dry.  Psychiatric: He has a normal mood and affect. His behavior is normal.     Laboratory Studies      No lab exists for component: GFR  Iron Studies  Lab Units 07/18/23  1428  IRON mcg/dL 53  FERRITIN ng/mL 604*  TIBC mcg/dL (calc) 816*  IRON SATURATION % (calc) 29        Urine  Lab Units 08/07/23 1318  COLOR UA  Yellow  CLARITY UA  Clear  KETONES UA  Negative  PH UA  5.5  UROBILINOGEN UA  0.2    Imaging and Other Studies  CLINICAL DATA:  Lower abdominal pain for the past 2 months. Severe  nausea when eating dinner earlier this year, sometimes vomiting.   EXAM:  CT ABDOMEN AND PELVIS WITH CONTRAST   TECHNIQUE:  Multidetector CT imaging of the abdomen and pelvis was performed  using the standard protocol following bolus administration of  intravenous contrast.   CONTRAST:  85mL ISOVUE -300 IOPAMIDOL  (ISOVUE -300) INJECTION 61%   COMPARISON:  Pelvic ultrasound dated 07/30/2016 and abdomen and  pelvis CT dated 11/19/2013.   FINDINGS:  Lower chest: Clear lung bases.   Hepatobiliary: No focal liver abnormality is seen. No gallstones,  gallbladder wall thickening, or biliary dilatation.   Pancreas: Mild to moderate diffuse pancreatic atrophy. No mass seen.   Spleen: Normal in size without focal abnormality.   Adrenals/Urinary Tract: Simple exophytic lower pole right renal  cyst. Small left renal cortical cyst. Multiple small bilateral renal  calculi. Mild to moderate diffuse bladder wall thickening, without   significant change. A posterior left bladder diverticulum is again  demonstrated, measuring 3.6 x 2.9 cm on image number 69 of series 2.  Normal appearing adrenal glands. No ureteral calculi or  hydronephrosis.   Stomach/Bowel: Multiple colonic diverticula without evidence of  diverticulitis. No evidence of appendicitis. Unremarkable stomach  and small bowel.   Vascular/Lymphatic: Extensive atheromatous arterial calcifications,  including the coronary arteries. No aneurysm or enlarged lymph  nodes.   Reproductive: Moderately enlarged prostate gland.   Other: Small bilateral inguinal hernias containing fat. Tiny  supraumbilical hernia containing fat.   Musculoskeletal: L4-5 degenerative changes and minimal lower  thoracic spine degenerative changes.   IMPRESSION:  1. No acute abnormality.  2. Small, nonobstructing bilateral renal calculi.  3. Stable diffuse bladder wall thickening. This is compatible with  chronic bladder outlet obstruction by the moderately enlarged  prostate gland.  4. Extensive colonic diverticulosis without evidence diverticulitis.  5. Calcified coronary artery atherosclerosis.    Electronically Signed    By: Elspeth Bathe M.D.    On: 08/15/2016 13:33     Orders Placed This Encounter  . POCT Urinalysis, manual with scope    06/25/2023-hemoglobin A1c 8.4%, urine microalbumin to creatinine ratio 582 07/18/2023-creatinine 1.45, GFR 43 08/07/2023-office urinalysis-glucose greater than 1000 mg/dL, bilirubin negative, ketone negative, specific gravity 1.025, blood negative, pH 5.5, protein 300 mg/dL, nitrite negative, leukocyte negative.  Urine microscopic exam-bland sediment     Impression/Recommendations   Patient is a 89 y.o. 1-White male   1. Chronic kidney disease due to type 2 diabetes mellitus (HCC)   2. Chronic kidney disease stage 3B (HCC)   3. Microalbuminuria due to type 2 diabetes mellitus (HCC)   4. Vitamin D  deficiency, not otherwise  specified   5. Abnormal urine   6. Hypertension    Chronic kidney disease appears to be secondary to diabetes and hypertension with contributions from atherosclerosis kidney stones, IV contrast exposure in 2018 , and advanced age.  Lab results from May and June are summarized above.  Creatinine of 1.45/GFR 43 from June 2025 which is not unreasonable.  This appears to be at his baseline.  He has  glucosuria which reports compliance with empagliflozin .  Plan: Continue aspirin , empagliflozin , telmisartan  for cardiovascular risk reduction and slowing progression of kidney disease. Patient used to be on simvastatin  but not taking that anymore. Continue telmisartan  for microalbuminuria. Try to achieve better control of diabetes.  Recent A1c 8.4% from 06/25/2023. Blood pressure and volume status are well-controlled at present.  No changes made today. Patient is on prescription vitamin D  replacement. At a future visit, we will obtain assessment of bone mineral metabolism. Smoking assessment-patient is a former smoker. Weight loss-discussed trying Glucerna/diabetic nutritional supplements daily.     Return in about 6 months (around 02/07/2024).   Saralee Stank, MD [facility] [address], [place] [address] Ph: [phone] Fax: [phone]  The following portions of the patient's chart were reviewed in this encounter and updated as appropriate:  Tobacco  Allergies  Meds  Problems  Med Hx  Surg Hx  Fam Hx

## 2023-08-07 DIAGNOSIS — I10 Essential (primary) hypertension: Secondary | ICD-10-CM | POA: Diagnosis not present

## 2023-08-07 DIAGNOSIS — R809 Proteinuria, unspecified: Secondary | ICD-10-CM | POA: Diagnosis not present

## 2023-08-07 DIAGNOSIS — E1122 Type 2 diabetes mellitus with diabetic chronic kidney disease: Secondary | ICD-10-CM | POA: Diagnosis not present

## 2023-08-07 DIAGNOSIS — N189 Chronic kidney disease, unspecified: Secondary | ICD-10-CM | POA: Diagnosis not present

## 2023-08-07 DIAGNOSIS — R8290 Unspecified abnormal findings in urine: Secondary | ICD-10-CM | POA: Diagnosis not present

## 2023-08-07 DIAGNOSIS — E1129 Type 2 diabetes mellitus with other diabetic kidney complication: Secondary | ICD-10-CM | POA: Diagnosis not present

## 2023-08-07 DIAGNOSIS — N1832 Chronic kidney disease, stage 3b: Secondary | ICD-10-CM | POA: Diagnosis not present

## 2023-08-07 DIAGNOSIS — E559 Vitamin D deficiency, unspecified: Secondary | ICD-10-CM | POA: Diagnosis not present

## 2023-08-12 DIAGNOSIS — S41112A Laceration without foreign body of left upper arm, initial encounter: Secondary | ICD-10-CM | POA: Diagnosis not present

## 2023-08-14 ENCOUNTER — Other Ambulatory Visit: Payer: Self-pay | Admitting: Internal Medicine

## 2023-08-19 DIAGNOSIS — S41112D Laceration without foreign body of left upper arm, subsequent encounter: Secondary | ICD-10-CM | POA: Diagnosis not present

## 2023-08-21 ENCOUNTER — Other Ambulatory Visit: Payer: Self-pay | Admitting: Internal Medicine

## 2023-08-22 DIAGNOSIS — S40922D Unspecified superficial injury of left upper arm, subsequent encounter: Secondary | ICD-10-CM | POA: Diagnosis not present

## 2023-09-10 ENCOUNTER — Encounter: Payer: Self-pay | Admitting: Pharmacist

## 2023-09-10 NOTE — Progress Notes (Signed)
 Pharmacy Quality Measure Review  This patient is appearing on a report for being at risk of failing the adherence measure for diabetes medications this calendar year.   Medication:  Jardiance  10 mg  Last fill date: 07/17/23 for 30 day supply  No refill hx available since 6/18 x30 day supply.  Last Rx in Epic = 30 day supply with no refills on 08/21/23. No record of filling.  Called [facility] pharmacy to confirm.  Unable to get through to pharmacy.  Suspect insurance report not up to date given no chronic medications are showing filled since similar time.  Attempted test claim though insurance not pulling in Epic.

## 2023-09-16 ENCOUNTER — Telehealth: Payer: Self-pay

## 2023-09-16 DIAGNOSIS — N183 Chronic kidney disease, stage 3 unspecified: Secondary | ICD-10-CM

## 2023-09-16 DIAGNOSIS — E1121 Type 2 diabetes mellitus with diabetic nephropathy: Secondary | ICD-10-CM

## 2023-09-16 NOTE — Telephone Encounter (Signed)
 Pt stated that he was prescribed Jardiance  but is unable to afford it and would like to see about going back on Metformin .

## 2023-09-17 ENCOUNTER — Telehealth: Payer: Self-pay

## 2023-09-17 NOTE — Progress Notes (Unsigned)
 Care Guide Pharmacy Note  09/17/2023 Name: Terry [name] MRN: [id number] DOB: Sep 20, 1933  Referred By: Marylynn Verneita CROME, MD Reason for referral: Complex Care Management and Call Attempt #1 (Unsuccessful initial outreach to schedule with PHARM D- Jon)   Terry [name] is a 89 y.o. year old male who is a primary care patient of Marylynn, Verneita CROME, MD.  Terry [name] was referred to the pharmacist for assistance related to: DMII  An unsuccessful telephone outreach was attempted today to contact the patient who was referred to the pharmacy team for assistance with medication assistance. Additional attempts will be made to contact the patient.  Terry [name], [facility] Guide  Direct Dial: [phone]  Fax [phone]

## 2023-09-17 NOTE — Addendum Note (Signed)
 Addended by: MARYLYNN VERNEITA CROME on: 09/17/2023 07:49 AM   Modules accepted: Orders

## 2023-09-18 NOTE — Telephone Encounter (Signed)
 Patient return call and has been made aware of provider notations.

## 2023-09-18 NOTE — Progress Notes (Signed)
 Care Guide Pharmacy Note  09/18/2023 Name: [name] MRN: [id number] DOB: 11-21-1933  Referred By: Marylynn Verneita CROME, MD Reason for referral: Complex Care Management, Call Attempt #1 (Unsuccessful initial outreach to schedule with PHARM [name]), Call Attempt #2 (Unsuccessful initial outreach to schedule with PHARM [name]), and Call Attempt #3 (Successful Initial outreach schedule with Shaver BERDINE JONETTA)   Terry [name] is a 89 y.o. year old male who is a primary care patient of Marylynn Verneita CROME, MD.  Gwenn JONETTA Evangelist was referred to the pharmacist for assistance related to: medication assistance  Successful contact was made with the patient to discuss pharmacy services including being ready for the pharmacist to call at least 5 minutes before the scheduled appointment time and to have medication bottles and any blood pressure readings ready for review. The patient agreed to meet with the pharmacist via telephone visit on (date/time). 09/26/23 @ 1 PM  Leotis Rase [facility], [facility] Guide  Direct Dial: [phone]  Fax [phone]

## 2023-09-18 NOTE — Progress Notes (Signed)
 Care Guide Pharmacy Note  09/18/2023 Name: Terry [name] MRN: [id number] DOB: 03/28/1933  Referred By: Marylynn Verneita CROME, MD Reason for referral: Complex Care Management, Call Attempt #1 (Unsuccessful initial outreach to schedule with PHARM [name]), and Call Attempt #2 (Unsuccessful initial outreach to schedule with [name])   Terry [name] is a 89 y.o. year old male who is a primary care patient of Marylynn, Verneita CROME, MD.  Terry [name] was referred to the pharmacist for assistance related to: DMII  A second unsuccessful telephone outreach was attempted today to contact the patient who was referred to the pharmacy team for assistance with medication assistance. Additional attempts will be made to contact the patient.  Terry [name] [facility], [facility] Guide  Direct Dial: [phone]  Fax [phone]

## 2023-09-18 NOTE — Telephone Encounter (Signed)
 LVM to call back to office to discuss message below per Dr Marylynn  Metformin  will not lower his BS . the benefits of Jardiance  are multiple, so please let him know that I will be asking our PharmD Manuelita to review his insurance and meds etc to see if she can get the Jardiance  for him

## 2023-09-18 NOTE — Telephone Encounter (Signed)
 Noted

## 2023-09-26 ENCOUNTER — Other Ambulatory Visit

## 2023-09-26 ENCOUNTER — Other Ambulatory Visit (INDEPENDENT_AMBULATORY_CARE_PROVIDER_SITE_OTHER): Admitting: Pharmacist

## 2023-09-26 DIAGNOSIS — E1121 Type 2 diabetes mellitus with diabetic nephropathy: Secondary | ICD-10-CM

## 2023-09-26 NOTE — Progress Notes (Signed)
   09/26/2023 Name: Terry [name] MRN: [id number] DOB: 10-04-1933  Subjective  Chief Complaint  Patient presents with   Medication Access    Care Team: Primary Care Provider: Marylynn Verneita CROME, MD  Reason for visit: ?  Terry [name] is a 89 y.o. male who presents today for a telephone visit with the pharmacist due to medication access concerns regarding their Jardiance . ?   Medication Access: ?  Reports that all medications are not affordable.   Prescription drug coverage: YES Payor: [employer] / Plan: [employer] PPO / Product Type: *No Product type* / .   Current Patient Assistance: N/A  Patient lives in a household of 2 with an estimated combined annual income of primarily just social security income. Patient estimates combined income <250% FPL.   Medicare LIS Eligible: No ; Income limit exceeded  Assessment and Plan:   1. Medication Access Patient estimates his combined annual income is <250% FPL. Does file taxes and plans to locate 2024 taxes for proof of income.  Patient preference to bring income doc / sign application in person at clinic  Next Steps: BI Cares application filled out and uploaded to clinic eFax folder for signature in front office Once signed by patient, application with income document attached may be placed in Tullo's folder for signature. After all signatures received, application to be faxed to [facility] by CMA and scanned to chart   Future Appointments  Date Time Provider Department Center  01/15/2024  3:00 PM Marylynn Verneita CROME, MD [facility] [address]  07/28/2024  3:00 PM [facility] ANNUAL WELLNESS VISIT [facility] [address] Manuelita FABIENE Geronimo, PharmD Clinical Pharmacist [facility] [phone]

## 2023-10-14 ENCOUNTER — Other Ambulatory Visit: Payer: Self-pay | Admitting: Internal Medicine

## 2023-10-15 ENCOUNTER — Encounter: Payer: Self-pay | Admitting: Pharmacist

## 2023-10-15 NOTE — Progress Notes (Signed)
 Brief Telephone Documentation Reason for Call: Patient Assistance follow up  Summary of Call: Called patient 9/16. No answer, left voicemail with direct callback number.  No record of patient coming in to sign BI cares application for new Jardiance  start.   Application remains in eFax folder at front office. Still needing patient signature and income documents from 2024.   Follow Up: Patient given PharmD direct line on voicemail, [phone]  Terry [name], PharmD Clinical Pharmacist [facility] [phone]

## 2023-10-17 ENCOUNTER — Encounter: Payer: Self-pay | Admitting: Pharmacist

## 2023-10-17 NOTE — Progress Notes (Signed)
 Pharmacy Quality Measure Review  This patient is appearing on a report for being at risk of failing the adherence measure for hypertension (ACEi/ARB) medications this calendar year.   Medication: telmisartan  80 mg Last fill date: 05/17/23 for 90 day supply  Insurance report was not up to date. No action needed at this time.  Medication has been refilled as of 7/15 x90 ds. Should have 1 additional 90ds refill remaining.

## 2023-10-29 ENCOUNTER — Encounter: Payer: Self-pay | Admitting: Pharmacist

## 2023-10-29 NOTE — Progress Notes (Signed)
 Brief Telephone Documentation Reason for Call: Patient left message regarding question for pharmacist   Summary of Call: Patient notes he was attempting to get tax documents for Jardiance  PAP application though encountered an issue with doing this. Notes there is an error on his tax return that he is working through with his tax guy.   Advised him to instead just look for his 2025 SSI letter for himself and for Mrs. Rolene and we can try submitting the application with just this if needed.   Follow Up: Patient given direct line for further questions/concerns.

## 2023-11-21 ENCOUNTER — Emergency Department

## 2023-11-21 ENCOUNTER — Observation Stay
Admission: EM | Admit: 2023-11-21 | Discharge: 2023-11-25 | Disposition: A | Discharge status: Home / Self Care | Attending: Hospitalist | Admitting: Hospitalist

## 2023-11-21 ENCOUNTER — Other Ambulatory Visit: Payer: Self-pay

## 2023-11-21 ENCOUNTER — Observation Stay

## 2023-11-21 DIAGNOSIS — R4701 Aphasia: Secondary | ICD-10-CM | POA: Diagnosis not present

## 2023-11-21 DIAGNOSIS — R6889 Other general symptoms and signs: Secondary | ICD-10-CM | POA: Diagnosis not present

## 2023-11-21 DIAGNOSIS — N1832 Chronic kidney disease, stage 3b: Secondary | ICD-10-CM | POA: Insufficient documentation

## 2023-11-21 DIAGNOSIS — E042 Nontoxic multinodular goiter: Secondary | ICD-10-CM | POA: Insufficient documentation

## 2023-11-21 DIAGNOSIS — I639 Cerebral infarction, unspecified: Secondary | ICD-10-CM | POA: Diagnosis present

## 2023-11-21 DIAGNOSIS — G9389 Other specified disorders of brain: Secondary | ICD-10-CM | POA: Diagnosis not present

## 2023-11-21 DIAGNOSIS — J449 Chronic obstructive pulmonary disease, unspecified: Secondary | ICD-10-CM | POA: Diagnosis not present

## 2023-11-21 DIAGNOSIS — E1122 Type 2 diabetes mellitus with diabetic chronic kidney disease: Secondary | ICD-10-CM | POA: Insufficient documentation

## 2023-11-21 DIAGNOSIS — Z860101 Personal history of adenomatous and serrated colon polyps: Secondary | ICD-10-CM | POA: Insufficient documentation

## 2023-11-21 DIAGNOSIS — Z681 Body mass index (BMI) 19 or less, adult: Secondary | ICD-10-CM

## 2023-11-21 DIAGNOSIS — I129 Hypertensive chronic kidney disease with stage 1 through stage 4 chronic kidney disease, or unspecified chronic kidney disease: Secondary | ICD-10-CM | POA: Diagnosis not present

## 2023-11-21 DIAGNOSIS — Z79899 Other long term (current) drug therapy: Secondary | ICD-10-CM | POA: Diagnosis not present

## 2023-11-21 DIAGNOSIS — K5900 Constipation, unspecified: Secondary | ICD-10-CM | POA: Diagnosis not present

## 2023-11-21 DIAGNOSIS — Z87891 Personal history of nicotine dependence: Secondary | ICD-10-CM | POA: Diagnosis not present

## 2023-11-21 DIAGNOSIS — I6523 Occlusion and stenosis of bilateral carotid arteries: Secondary | ICD-10-CM | POA: Diagnosis not present

## 2023-11-21 DIAGNOSIS — I700 Atherosclerosis of aorta: Secondary | ICD-10-CM | POA: Diagnosis not present

## 2023-11-21 DIAGNOSIS — Z7982 Long term (current) use of aspirin: Secondary | ICD-10-CM | POA: Insufficient documentation

## 2023-11-21 DIAGNOSIS — F1099 Alcohol use, unspecified with unspecified alcohol-induced disorder: Secondary | ICD-10-CM | POA: Diagnosis not present

## 2023-11-21 DIAGNOSIS — E0590 Thyrotoxicosis, unspecified without thyrotoxic crisis or storm: Secondary | ICD-10-CM | POA: Diagnosis not present

## 2023-11-21 DIAGNOSIS — R636 Underweight: Secondary | ICD-10-CM | POA: Diagnosis not present

## 2023-11-21 DIAGNOSIS — Z85828 Personal history of other malignant neoplasm of skin: Secondary | ICD-10-CM | POA: Diagnosis not present

## 2023-11-21 DIAGNOSIS — R109 Unspecified abdominal pain: Secondary | ICD-10-CM | POA: Diagnosis not present

## 2023-11-21 DIAGNOSIS — E119 Type 2 diabetes mellitus without complications: Secondary | ICD-10-CM

## 2023-11-21 DIAGNOSIS — R4702 Dysphasia: Secondary | ICD-10-CM | POA: Diagnosis not present

## 2023-11-21 DIAGNOSIS — R000 Tachycardia, unspecified: Secondary | ICD-10-CM | POA: Diagnosis not present

## 2023-11-21 DIAGNOSIS — D631 Anemia in chronic kidney disease: Secondary | ICD-10-CM

## 2023-11-21 DIAGNOSIS — I10 Essential (primary) hypertension: Secondary | ICD-10-CM | POA: Diagnosis present

## 2023-11-21 LAB — PROTIME-INR
INR: 0.9 (ref 0.8–1.2)
Prothrombin Time: 12.3 s (ref 11.4–15.2)

## 2023-11-21 LAB — COMPREHENSIVE METABOLIC PANEL WITH GFR
ALT: 10 U/L (ref 0–44)
AST: 20 U/L (ref 15–41)
Albumin: 3.7 g/dL (ref 3.5–5.0)
Alkaline Phosphatase: 59 U/L (ref 38–126)
Anion gap: 15 (ref 5–15)
BUN: 29 mg/dL — ABNORMAL HIGH (ref 8–23)
CO2: 22 mmol/L (ref 22–32)
Calcium: 9.4 mg/dL (ref 8.9–10.3)
Chloride: 98 mmol/L (ref 98–111)
Creatinine, Ser: 1.44 mg/dL — ABNORMAL HIGH (ref 0.61–1.24)
GFR, Estimated: 47 mL/min — ABNORMAL LOW (ref >60)
Glucose, Bld: 149 mg/dL — ABNORMAL HIGH (ref 70–99)
Potassium: 4.2 mmol/L (ref 3.5–5.1)
Sodium: 135 mmol/L (ref 135–145)
Total Bilirubin: 1.4 mg/dL — ABNORMAL HIGH (ref 0.0–1.2)
Total Protein: 7.2 g/dL (ref 6.5–8.1)

## 2023-11-21 LAB — URINALYSIS, ROUTINE W REFLEX MICROSCOPIC
Bilirubin Urine: NEGATIVE
Glucose, UA: NEGATIVE mg/dL
Hgb urine dipstick: NEGATIVE
Ketones, ur: 20 mg/dL — AB
Leukocytes,Ua: NEGATIVE
Nitrite: NEGATIVE
Protein, ur: >=300 mg/dL — AB
RBC / HPF: 0 RBC/hpf (ref 0–5)
Specific Gravity, Urine: 1.017 (ref 1.005–1.030)
pH: 5 (ref 5.0–8.0)

## 2023-11-21 LAB — URINE DRUG SCREEN, QUALITATIVE (ARMC ONLY)
Amphetamines, Ur Screen: NOT DETECTED
Barbiturates, Ur Screen: NOT DETECTED
Benzodiazepine, Ur Scrn: NOT DETECTED
Cannabinoid 50 Ng, Ur ~~LOC~~: NOT DETECTED
Cocaine Metabolite,Ur ~~LOC~~: NOT DETECTED
MDMA (Ecstasy)Ur Screen: NOT DETECTED
Methadone Scn, Ur: NOT DETECTED
Opiate, Ur Screen: NOT DETECTED
Phencyclidine (PCP) Ur S: NOT DETECTED
Tricyclic, Ur Screen: NOT DETECTED

## 2023-11-21 LAB — DIFFERENTIAL
Abs Immature Granulocytes: 0.03 K/uL (ref 0.00–0.07)
Basophils Absolute: 0 K/uL (ref 0.0–0.1)
Basophils Relative: 0 %
Eosinophils Absolute: 0 K/uL (ref 0.0–0.5)
Eosinophils Relative: 0 %
Immature Granulocytes: 0 %
Lymphocytes Relative: 14 %
Lymphs Abs: 1.4 K/uL (ref 0.7–4.0)
Monocytes Absolute: 1 K/uL (ref 0.1–1.0)
Monocytes Relative: 10 %
Neutro Abs: 7.6 K/uL (ref 1.7–7.7)
Neutrophils Relative %: 76 %

## 2023-11-21 LAB — CBC
HCT: 34.6 % — ABNORMAL LOW (ref 39.0–52.0)
Hemoglobin: 11.7 g/dL — ABNORMAL LOW (ref 13.0–17.0)
MCH: 32.1 pg (ref 26.0–34.0)
MCHC: 33.8 g/dL (ref 30.0–36.0)
MCV: 94.8 fL (ref 80.0–100.0)
Platelets: 360 K/uL (ref 150–400)
RBC: 3.65 MIL/uL — ABNORMAL LOW (ref 4.22–5.81)
RDW: 14.5 % (ref 11.5–15.5)
WBC: 10 K/uL (ref 4.0–10.5)
nRBC: 0 % (ref 0.0–0.2)

## 2023-11-21 LAB — GLUCOSE, CAPILLARY: Glucose-Capillary: 136 mg/dL — ABNORMAL HIGH (ref 70–99)

## 2023-11-21 LAB — APTT: aPTT: 35 s (ref 24–36)

## 2023-11-21 MED ORDER — ACETAMINOPHEN 650 MG RE SUPP: 650.0000 mg | RECTAL | Status: DC | PRN

## 2023-11-21 MED ORDER — INSULIN ASPART 100 UNIT/ML IJ SOLN
0.0000 [IU] | Freq: Three times a day (TID) | INTRAMUSCULAR | Status: DC
  Administered 2023-11-22: 1 [IU] via SUBCUTANEOUS
  Administered 2023-11-23: 2 [IU] via SUBCUTANEOUS
  Administered 2023-11-23: 1 [IU] via SUBCUTANEOUS
  Administered 2023-11-23 – 2023-11-24 (×2): 2 [IU] via SUBCUTANEOUS
  Administered 2023-11-24: 3 [IU] via SUBCUTANEOUS
  Administered 2023-11-24 – 2023-11-25 (×2): 1 [IU] via SUBCUTANEOUS
  Administered 2023-11-25: 3 [IU] via SUBCUTANEOUS
  Filled 2023-11-21 (×9): qty 1

## 2023-11-21 MED ORDER — IPRATROPIUM-ALBUTEROL 0.5-2.5 (3) MG/3ML IN SOLN: 3.0000 mL | Freq: Four times a day (QID) | RESPIRATORY_TRACT | Status: DC | PRN

## 2023-11-21 MED ORDER — ENOXAPARIN SODIUM 30 MG/0.3ML IJ SOSY
30.0000 mg | PREFILLED_SYRINGE | INTRAMUSCULAR | Status: DC
  Administered 2023-11-22 – 2023-11-24 (×3): 30 mg via SUBCUTANEOUS
  Filled 2023-11-21 (×3): qty 0.3

## 2023-11-21 MED ORDER — METHIMAZOLE 5 MG PO TABS
5.0000 mg | ORAL_TABLET | Freq: Every day | ORAL | Status: DC
  Administered 2023-11-22 – 2023-11-25 (×4): 5 mg via ORAL
  Filled 2023-11-21 (×4): qty 1

## 2023-11-21 MED ORDER — ACETAMINOPHEN 325 MG PO TABS: 650.0000 mg | ORAL_TABLET | ORAL | Status: DC | PRN

## 2023-11-21 MED ORDER — ASPIRIN 81 MG PO TBEC
81.0000 mg | DELAYED_RELEASE_TABLET | Freq: Every day | ORAL | Status: DC
  Administered 2023-11-22 – 2023-11-25 (×4): 81 mg via ORAL
  Filled 2023-11-21 (×4): qty 1

## 2023-11-21 MED ORDER — INSULIN ASPART 100 UNIT/ML IJ SOLN: 0.0000 [IU] | Freq: Every day | INTRAMUSCULAR | Status: DC

## 2023-11-21 MED ORDER — ACETAMINOPHEN 160 MG/5ML PO SOLN: 650.0000 mg | ORAL | Status: DC | PRN

## 2023-11-21 MED ORDER — SODIUM CHLORIDE 0.9 % IV SOLN: INTRAVENOUS | Status: DC

## 2023-11-21 MED ORDER — STROKE: EARLY STAGES OF RECOVERY BOOK: Freq: Once | Status: AC

## 2023-11-21 MED ORDER — CLOPIDOGREL BISULFATE 75 MG PO TABS
75.0000 mg | ORAL_TABLET | Freq: Every day | ORAL | Status: DC
  Administered 2023-11-22 – 2023-11-25 (×4): 75 mg via ORAL
  Filled 2023-11-21 (×4): qty 1

## 2023-11-21 NOTE — ED Triage Notes (Signed)
 Pt to ED via [facility] from home for c/o aphasia. Pt's neighbor noticed pt had not taken his trash out and checked on him. Pt has difficulty finding correct words and cannot answer all questions. Pt able to follow commands and ambulated to ambulance with assistance. No LKW.

## 2023-11-21 NOTE — Assessment & Plan Note (Signed)
 Permissive hypertension for first 24-48 hrs post stroke onset: Prn Labetalol IV or Vasotec  IV If BP greater than 220/120  Statins for LDL goal less than 70 ASA 81mg  daily, Plavix 75mg  daily x 3 weeks then monotherapy thereafter Telemetry, Echo, MRI Avoid dextrose containing fluids, Maintain euglycemia, euthermia Neuro checks q4 hrs x 24 hrs and then per shift Head of bed 30 degrees Physical therapy/Occupational therapy/Speech therapy if failed dysphagia screen Consider neurology consult in the am

## 2023-11-21 NOTE — Assessment & Plan Note (Signed)
 Anemia of CKD 3B At baseline

## 2023-11-21 NOTE — Progress Notes (Signed)
 Anticoagulation monitoring(Lovenox):  89 yo male ordered Lovenox 40 mg Q24h    Filed Weights   11/21/23 1705  Weight: 57.3 kg (126 lb 5.2 oz)   BMI 16.7    Lab Results  Component Value Date   CREATININE 1.44 (H) 11/21/2023   CREATININE 1.45 07/18/2023   CREATININE 1.61 (H) 06/25/2023   Estimated Creatinine Clearance: 28.7 mL/min (A) (by C-G formula based on SCr of 1.44 mg/dL (H)). Hemoglobin & Hematocrit     Component Value Date/Time   HGB 11.7 (L) 11/21/2023 1707   HGB 11.0 (L) 08/06/2016 1622   HCT 34.6 (L) 11/21/2023 1707   HCT 33.6 (L) 08/06/2016 1622     Per Protocol for Patient with estCrcl < 30 ml/min and BMI < 30, will transition to Lovenox 30 mg Q24h.

## 2023-11-21 NOTE — H&P (Signed)
 History and Physical    Patient: Terry [name] FMW:[id number] DOB: [birth year] DOA: 11/21/2023 DOS: the patient was seen and examined on 11/21/2023 PCP: Marylynn Verneita CROME, MD  Patient coming from: Home  Chief Complaint:  Chief Complaint  Patient presents with   Aphasia    HPI: Terry [name] is a 89 y.o. male with medical history significant for DM, HTN, stage IIIb CKD with associated anemia, hypothyroidism, COPD being admitted with a suspected acute stroke with a principal deficit of expressive aphasia.  Last known well was on 10/22.  On the day of arrival, 10/23, neighbor noticed that patient had not put the trash out and went to check on him and noticed he was having difficulty answering his questions and getting the right words out.  He was able to follow commands and ambulated to the ambulance with assistance.  He arrived outside tPA window given last known well of the day prior. In the ED BP slightly elevated at 177/95, low-grade temp of 99.7 with pulse 106. CBC notable for mild anemia of 11 1 which is baseline Creatinine at baseline at 1.44 mild bilirubin elevation to 1.4.  Urinalysis notable for 20 ketones.  UDS clean. EKG with sinus tachycardia at 104 and a few VPCs CT head showing generalized cerebral atrophy and microvascular disease changes of the supratentorial brain and no acute abnormality MRI ordered Admission requested     Past Medical History:  Diagnosis Date   Anemia    COPD (chronic obstructive pulmonary disease) (HCC)    Diabetes mellitus 2008   GERD (gastroesophageal reflux disease)    Hyperlipidemia    Hypertension    hyperthyroidism    Squamous cell carcinoma of skin of left upper arm November 2015   Excised to negative margins, associated keratoacanthoma.   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 Christ. He is due for followup colonoscopy    Past Surgical History:  Procedure Laterality Date   APPENDECTOMY  1970    ARM SKIN LESION BIOPSY / EXCISION Left 2016   CATARACT EXTRACTION, BILATERAL  2012   Porfilio   COLONOSCOPY  12-10-12   COLONOSCOPY W/ POLYPECTOMY  2011   Dr. Eartha   COLONOSCOPY WITH PROPOFOL  N/A 09/19/2016   Procedure: COLONOSCOPY WITH PROPOFOL ;  Surgeon: Dessa Reyes ORN, MD;  Location: [facility] ENDOSCOPY;  Service: Endoscopy;  Laterality: N/A;   ESOPHAGOGASTRODUODENOSCOPY (EGD) WITH PROPOFOL  N/A 09/19/2016   Procedure: ESOPHAGOGASTRODUODENOSCOPY (EGD) WITH PROPOFOL ;  Surgeon: Dessa Reyes ORN, MD;  Location: [facility] ENDOSCOPY;  Service: Endoscopy;  Laterality: N/A;   Social History:  reports that he quit smoking about 24 years ago. His smoking use included cigarettes. He started smoking about 74 years ago. He has a 100 pack-year smoking history. He has never used smokeless tobacco. He reports that he does not currently use alcohol. He reports that he does not use drugs.  No Known Allergies  Family History  Problem Relation Age of Onset   Hypertension Father    Diabetes Maternal Aunt    Hyperlipidemia Maternal Uncle    Birth defects Neg Hx     Prior to Admission medications   Medication Sig Start Date End Date Taking? Authorizing Provider  albuterol  (VENTOLIN  HFA) 108 (90 Base) MCG/ACT inhaler Inhale 2 puffs into the lungs every 6 (six) hours as needed for wheezing or shortness of breath. 11/02/20   Marylynn Verneita CROME, MD  amLODipine  (NORVASC ) 10 MG tablet Take 1 tablet (10 mg total) by mouth daily.  06/25/23   Marylynn Verneita CROME, MD  aspirin  EC 81 MG tablet Take 1 tablet (81 mg total) by mouth daily. 05/02/15   Darron Deatrice LABOR, MD  b complex vitamins tablet Take 1 tablet by mouth daily.    [provider]  budesonide  (PULMICORT ) 0.5 MG/2ML nebulizer solution Take 2 mLs (0.5 mg total) by nebulization daily. 02/02/22   Marylynn Verneita CROME, MD  cyanocobalamin  1000 MCG tablet Take 1,000 mcg by mouth daily.    [provider]  ipratropium-albuterol  (DUONEB) 0.5-2.5 (3) MG/3ML SOLN Take  3 mLs by nebulization every 6 (six) hours as needed. 08/23/22   Marylynn Verneita CROME, MD  JARDIANCE  10 MG TABS tablet TAKE 1 TABLET BY MOUTH DAILY BEFORE BREAKFAST. 08/21/23   Marylynn Verneita CROME, MD  methimazole  (TAPAZOLE ) 5 MG tablet TAKE 1 TABLET (5 MG TOTAL) BY MOUTH DAILY. 08/15/23   Marylynn Verneita CROME, MD  metoprolol  succinate (TOPROL -XL) 50 MG 24 hr tablet Take 1 tablet (50 mg total) by mouth daily. 06/25/23   Marylynn Verneita CROME, MD  omeprazole  (PRILOSEC) 40 MG capsule Take 1 capsule (40 mg total) by mouth daily. 06/25/23   Marylynn Verneita CROME, MD  telmisartan  (MICARDIS ) 80 MG tablet TAKE 1 TABLET BY MOUTH EVERYDAY AT BEDTIME 06/25/23   Marylynn Verneita CROME, MD  Vitamin D , Ergocalciferol , (DRISDOL ) 1.25 MG (50000 UNIT) CAPS capsule TAKE 1 CAPSULE BY MOUTH ONE TIME PER WEEK 10/14/23   Marylynn Verneita CROME, MD    Physical Exam: Vitals:   11/21/23 1702 11/21/23 1705 11/21/23 2000  BP: (!) 177/95  (!) 172/78  Pulse: (!) 106  97  Resp: 20  19  Temp: 99.7 F (37.6 C)  99 F (37.2 C)  TempSrc: Oral    SpO2: 99%  96%  Weight:  57.3 kg   Height:  6' 1 (1.854 m)    Physical Exam Vitals and nursing note reviewed.  Constitutional:      General: He is not in acute distress.    Appearance: He is underweight.  HENT:     Head: Normocephalic and atraumatic.  Cardiovascular:     Rate and Rhythm: Normal rate and regular rhythm.     Heart sounds: Normal heart sounds.  Pulmonary:     Effort: Pulmonary effort is normal.     Breath sounds: Normal breath sounds.  Abdominal:     Palpations: Abdomen is soft.     Tenderness: There is no abdominal tenderness.  Neurological:     Mental Status: Mental status is at baseline.     Comments: Expressive aphasia without other focal deficit     Labs on Admission: I have personally reviewed following labs and imaging studies  CBC: Recent Labs  Lab 11/21/23 1707  WBC 10.0  NEUTROABS 7.6  HGB 11.7*  HCT 34.6*  MCV 94.8  PLT 360   Basic Metabolic Panel: Recent Labs  Lab  11/21/23 1707  NA 135  K 4.2  CL 98  CO2 22  GLUCOSE 149*  BUN 29*  CREATININE 1.44*  CALCIUM  9.4   GFR: Estimated Creatinine Clearance: 28.7 mL/min (A) (by C-G formula based on SCr of 1.44 mg/dL (H)). Liver Function Tests: Recent Labs  Lab 11/21/23 1707  AST 20  ALT 10  ALKPHOS 59  BILITOT 1.4*  PROT 7.2  ALBUMIN 3.7   No results for input(s): LIPASE, AMYLASE in the last 168 hours. No results for input(s): AMMONIA in the last 168 hours. Coagulation Profile: Recent Labs  Lab 11/21/23 1707  INR 0.9   Cardiac Enzymes: No results for input(s): CKTOTAL, CKMB, CKMBINDEX, TROPONINI in the last 168 hours. BNP (last 3 results) No results for input(s): PROBNP in the last 8760 hours. HbA1C: No results for input(s): HGBA1C in the last 72 hours. CBG: No results for input(s): GLUCAP in the last 168 hours. Lipid Profile: No results for input(s): CHOL, HDL, LDLCALC, TRIG, CHOLHDL, LDLDIRECT in the last 72 hours. Thyroid  Function Tests: No results for input(s): TSH, T4TOTAL, FREET4, T3FREE, THYROIDAB in the last 72 hours. Anemia Panel: No results for input(s): VITAMINB12, FOLATE, FERRITIN, TIBC, IRON, RETICCTPCT in the last 72 hours. Urine analysis:    Component Value Date/Time   COLORURINE YELLOW (A) 11/21/2023 2020   APPEARANCEUR CLEAR (A) 11/21/2023 2020   APPEARANCEUR Clear 12/12/2012 0958   LABSPEC 1.017 11/21/2023 2020   LABSPEC 1.012 12/12/2012 0958   PHURINE 5.0 11/21/2023 2020   GLUCOSEU NEGATIVE 11/21/2023 2020   GLUCOSEU NEGATIVE 02/14/2023 1039   HGBUR NEGATIVE 11/21/2023 2020   BILIRUBINUR NEGATIVE 11/21/2023 2020   BILIRUBINUR neg 02/14/2023 1405   BILIRUBINUR Negative 12/12/2012 0958   KETONESUR 20 (A) 11/21/2023 2020   PROTEINUR >=300 (A) 11/21/2023 2020   UROBILINOGEN 0.2 02/14/2023 1405   UROBILINOGEN Normal 02/14/2023 1045   UROBILINOGEN 0.2 02/14/2023 1039   NITRITE NEGATIVE 11/21/2023 2020    LEUKOCYTESUR NEGATIVE 11/21/2023 2020   LEUKOCYTESUR Negative 12/12/2012 0958    Radiological Exams on Admission: CT HEAD WO CONTRAST Result Date: 11/21/2023 CLINICAL DATA:  Aphasia. EXAM: CT HEAD WITHOUT CONTRAST TECHNIQUE: Contiguous axial images were obtained from the base of the skull through the vertex without intravenous contrast. RADIATION DOSE REDUCTION: This exam was performed according to the departmental dose-optimization program which includes automated exposure control, adjustment of the mA and/or kV according to patient size and/or use of iterative reconstruction technique. COMPARISON:  None Available. FINDINGS: Brain: There is generalized cerebral atrophy with widening of the extra-axial spaces and ventricular dilatation. There are areas of decreased attenuation within the white matter tracts of the supratentorial brain, consistent with microvascular disease changes. Vascular: Marked severity bilateral cavernous carotid artery calcification is noted. Skull: Normal. Negative for fracture or focal lesion. Sinuses/Orbits: No acute finding. Other: None. IMPRESSION: 1. Generalized cerebral atrophy and microvascular disease changes of the supratentorial brain. 2. No acute intracranial abnormality. Electronically Signed   By: Suzen Dials M.D.   On: 11/21/2023 18:03   Data Reviewed for HPI: Relevant notes from primary care and specialist visits, past discharge summaries as available in EHR, including Care Everywhere. Prior diagnostic testing as pertinent to current admission diagnoses Updated medications and problem lists for reconciliation ED course, including vitals, labs, imaging, treatment and response to treatment Triage notes, nursing and pharmacy notes and ED provider's notes Notable results as noted above in HPI      Assessment and Plan: Acute CVA (cerebrovascular accident) (HCC) Permissive hypertension for first 24-48 hrs post stroke onset: Prn Labetalol IV or Vasotec  IV  If BP greater than 220/120  Statins for LDL goal less than 70 ASA 81mg  daily, Plavix 75mg  daily x 3 weeks then monotherapy thereafter Telemetry, Echo, MRI Avoid dextrose containing fluids, Maintain euglycemia, euthermia Neuro checks q4 hrs x 24 hrs and then per shift Head of bed 30 degrees Physical therapy/Occupational therapy/Speech therapy if failed dysphagia screen Consider neurology consult in the am   Diabetes mellitus, type II (HCC) Sliding scale insulin coverage  Hypertension Holding home antihypertensives to allow for permissive hypertension  Stage 3b chronic kidney disease (HCC)  Anemia of CKD 3B At baseline  COPD (chronic obstructive pulmonary disease) (HCC) Not acutely exacerbated DuoNebs as needed  Underweight (BMI < 18.5) Consider Ensure when cleared to eat    DVT prophylaxis: Lovenox  Consults: none  Advance Care Planning:   Code Status: Limited: Do not attempt resuscitation (DNR) -DNR-LIMITED -Do Not Intubate/DNI    Family Communication: none  Disposition Plan: Back to previous home environment  Severity of Illness: The appropriate patient status for this patient is OBSERVATION. Observation status is judged to be reasonable and necessary in order to provide the required intensity of service to ensure the patient's safety. The patient's presenting symptoms, physical exam findings, and initial radiographic and laboratory data in the context of their medical condition is felt to place them at decreased risk for further clinical deterioration. Furthermore, it is anticipated that the patient will be medically stable for discharge from the hospital within 2 midnights of admission.   Author: Delayne LULLA Solian, MD 11/21/2023 10:06 PM  For on call review [email or web address].

## 2023-11-21 NOTE — Assessment & Plan Note (Signed)
 Consider Ensure when cleared to eat

## 2023-11-21 NOTE — ED Notes (Signed)
 Pts family at bedside requesting to speak with Dr. Levander regarding CT scan results. Dr. Levander made aware via secure chat. Reports that she will be in to speak with family but it may be a little bit. Family member at bedside made aware of same.

## 2023-11-21 NOTE — Assessment & Plan Note (Addendum)
 Multinodular goiter Continue methimazole  Holding metoprolol  for permissive hypertension

## 2023-11-21 NOTE — Assessment & Plan Note (Signed)
 Sliding scale insulin  coverage

## 2023-11-21 NOTE — Assessment & Plan Note (Signed)
Holding home antihypertensives to allow for permissive hypertension

## 2023-11-21 NOTE — Assessment & Plan Note (Signed)
 Not acutely exacerbated DuoNebs as needed

## 2023-11-21 NOTE — ED Provider Notes (Signed)
 [facility] Provider Note    Event Date/Time   First MD Initiated Contact with Patient 11/21/23 1655     (approximate)   History   Aphasia   HPI  Terry [name] is a 89 year old male with history of T2DM, HTN presenting to the emergency department for evaluation of expressive aphasia.  Seen normal sometime yesterday by his neighbor, neighbor unable to state exactly what time he saw the patient.  Today, the neighbor noticed that he had to take out his trash and went to check on him.  The patient was having difficulty stating words and answering questions.  EMS was called.  Patient last seen normal by neighbor yesterday, but family members aware that patient is being brought to the ER.  Spoke with patient's daughter over the phone and daughter-in-law who presented to bedside.  They deny any history of similar communication issues and to report that patient speech is significantly altered from his baseline.   Physical Exam   Triage Vital Signs: ED Triage Vitals  Encounter Vitals Group     BP 11/21/23 1702 (!) 177/95     Girls Systolic BP Percentile --      Girls Diastolic BP Percentile --      Boys Systolic BP Percentile --      Boys Diastolic BP Percentile --      Pulse Rate 11/21/23 1702 (!) 106     Resp 11/21/23 1702 20     Temp 11/21/23 1702 99.7 F (37.6 C)     Temp Source 11/21/23 1702 Oral     SpO2 11/21/23 1702 99 %     Weight 11/21/23 1705 126 lb 5.2 oz (57.3 kg)     Height 11/21/23 1705 6' 1 (1.854 m)     Head Circumference --      Peak Flow --      Pain Score --      Pain Loc --      Pain Education --      Exclude from Growth Chart --     Most recent vital signs: Vitals:   11/21/23 1702 11/21/23 2000  BP: (!) 177/95 (!) 172/78  Pulse: (!) 106 97  Resp: 20 19  Temp: 99.7 F (37.6 C) 99 F (37.2 C)  SpO2: 99% 96%     General: Awake, interactive  CV:  Good peripheral perfusion Resp:  Unlabored respirations, lungs clear to  auscultation Abd:  Nondistended.  Neuro:  Keenly aware, incorrectly answers age, unable to state a month when asked the month states multiple members and colors, able to blink eyes and squeeze hands, normal horizontal extraocular movements, no visual field loss, normal facial symmetry, no arm or leg motor drift, no limb ataxia, intact sensation bilaterally without neglect, no dysarthria, significant aphasia noted but patient is able to say certain words, somewhat waxing and waning on multiple evaluations NIH 2   ED Results / Procedures / Treatments   Labs (all labs ordered are listed, but only abnormal results are displayed) Labs Reviewed  CBC - Abnormal; Notable for the following components:      Result Value   RBC 3.65 (*)    Hemoglobin 11.7 (*)    HCT 34.6 (*)    All other components within normal limits  COMPREHENSIVE METABOLIC PANEL WITH GFR - Abnormal; Notable for the following components:   Glucose, Bld 149 (*)    BUN 29 (*)    Creatinine, Ser 1.44 (*)    Total Bilirubin  1.4 (*)    GFR, Estimated 47 (*)    All other components within normal limits  URINALYSIS, ROUTINE W REFLEX MICROSCOPIC - Abnormal; Notable for the following components:   Color, Urine YELLOW (*)    APPearance CLEAR (*)    Ketones, ur 20 (*)    Protein, ur >=300 (*)    Bacteria, UA RARE (*)    All other components within normal limits  PROTIME-INR  APTT  DIFFERENTIAL  URINE DRUG SCREEN, QUALITATIVE ([facility] ONLY)  LIPID PANEL  CBG MONITORING, ED     EKG EKG independently reviewed and interpreted by myself demonstrates:  EKG demonstrate sinus tachycardia at a rate of 104, PR 195, QRS 140, QTc 491, right bundle and left anterior fascicular block noted, no STEMI  RADIOLOGY Imaging independently reviewed and interpreted by myself demonstrates:  CT head without acute bleed, generalized atrophy noted  Formal Radiology Read:  CT HEAD WO CONTRAST Result Date: 11/21/2023 CLINICAL DATA:  Aphasia. EXAM: CT  HEAD WITHOUT CONTRAST TECHNIQUE: Contiguous axial images were obtained from the base of the skull through the vertex without intravenous contrast. RADIATION DOSE REDUCTION: This exam was performed according to the departmental dose-optimization program which includes automated exposure control, adjustment of the mA and/or kV according to patient size and/or use of iterative reconstruction technique. COMPARISON:  None Available. FINDINGS: Brain: There is generalized cerebral atrophy with widening of the extra-axial spaces and ventricular dilatation. There are areas of decreased attenuation within the white matter tracts of the supratentorial brain, consistent with microvascular disease changes. Vascular: Marked severity bilateral cavernous carotid artery calcification is noted. Skull: Normal. Negative for fracture or focal lesion. Sinuses/Orbits: No acute finding. Other: None. IMPRESSION: 1. Generalized cerebral atrophy and microvascular disease changes of the supratentorial brain. 2. No acute intracranial abnormality. Electronically Signed   By: Suzen Dials M.D.   On: 11/21/2023 18:03    PROCEDURES:  Critical Care performed: No  Procedures   MEDICATIONS ORDERED IN ED: Medications  aspirin  EC tablet 81 mg (has no administration in time range)  clopidogrel (PLAVIX) tablet 75 mg (has no administration in time range)  methimazole  (TAPAZOLE ) tablet 5 mg (has no administration in time range)  ipratropium-albuterol  (DUONEB) 0.5-2.5 (3) MG/3ML nebulizer solution 3 mL (has no administration in time range)   stroke: early stages of recovery book (has no administration in time range)  0.9 %  sodium chloride  infusion (has no administration in time range)  acetaminophen (TYLENOL) tablet 650 mg (has no administration in time range)    Or  acetaminophen (TYLENOL) 160 MG/5ML solution 650 mg (has no administration in time range)    Or  acetaminophen (TYLENOL) suppository 650 mg (has no administration in  time range)  enoxaparin (LOVENOX) injection 30 mg (has no administration in time range)  insulin aspart (novoLOG) injection 0-9 Units (has no administration in time range)  insulin aspart (novoLOG) injection 0-5 Units (has no administration in time range)     IMPRESSION / MDM / ASSESSMENT AND PLAN / ED COURSE  I reviewed the triage vital signs and the nursing notes.  Differential diagnosis includes, but is not limited to, intracranial bleed, CVA, TIA, electrolyte abnormality, hypoglycemia, UTI, other infection  Patient's presentation is most consistent with acute presentation with potential threat to life or bodily function.  89 year old male presenting to the emergency department for evaluation of new onset expressive aphasia.  Last known well is outside the thrombolytic window, likely outside window for thrombectomy and without focal weakness suggestive of large  vessel occlusion.  Labs were obtained with reassuring CBC with stable anemia, CMP without critical derangements, mild renal impairment noted.  Urine without evidence of infection.  CT head without acute findings.  Do have ongoing concerns for possible CVA, I do think patient would benefit from admission for further evaluation.  Will reach out to hospitalist team.   Clinical Course as of 11/22/23 0006  Thu Nov 21, 2023  2157 Case discussed with hospitalist team.  They will evaluate for anticipated admission. [NR]    Clinical Course User Index [NR] Levander Slate, MD        FINAL CLINICAL IMPRESSION(S) / ED DIAGNOSES   Final diagnoses:  Expressive aphasia     Rx / DC Orders   ED Discharge Orders     None        Note:  This document was prepared using Dragon voice recognition software and may include unintentional dictation errors.   Levander Slate, MD 11/21/23 7758    Levander Slate, MD 11/22/23 9993

## 2023-11-22 ENCOUNTER — Other Ambulatory Visit: Payer: Self-pay

## 2023-11-22 ENCOUNTER — Observation Stay
Admit: 2023-11-22 | Discharge: 2023-11-22 | Disposition: A | Discharge status: Home / Self Care | Attending: Internal Medicine | Admitting: Internal Medicine

## 2023-11-22 ENCOUNTER — Telehealth: Payer: Self-pay

## 2023-11-22 ENCOUNTER — Observation Stay

## 2023-11-22 DIAGNOSIS — R29818 Other symptoms and signs involving the nervous system: Secondary | ICD-10-CM | POA: Diagnosis not present

## 2023-11-22 DIAGNOSIS — R4701 Aphasia: Secondary | ICD-10-CM | POA: Diagnosis not present

## 2023-11-22 DIAGNOSIS — I639 Cerebral infarction, unspecified: Secondary | ICD-10-CM | POA: Diagnosis not present

## 2023-11-22 LAB — ECHOCARDIOGRAM COMPLETE
Height: 73 in
S' Lateral: 4.3 cm
Weight: 2021.18 [oz_av]

## 2023-11-22 LAB — GLUCOSE, CAPILLARY
Glucose-Capillary: 119 mg/dL — ABNORMAL HIGH (ref 70–99)
Glucose-Capillary: 198 mg/dL — ABNORMAL HIGH (ref 70–99)
Glucose-Capillary: 147 mg/dL — ABNORMAL HIGH (ref 70–99)
Glucose-Capillary: 160 mg/dL — ABNORMAL HIGH (ref 70–99)

## 2023-11-22 LAB — LIPID PANEL
Cholesterol: 190 mg/dL (ref 0–200)
HDL: 82 mg/dL (ref >40)
LDL Cholesterol: 87 mg/dL (ref 0–99)
Total CHOL/HDL Ratio: 2.3 ratio
Triglycerides: 103 mg/dL (ref <150)
VLDL: 21 mg/dL (ref 0–40)

## 2023-11-22 LAB — T4, FREE: Free T4: 1.25 ng/dL — ABNORMAL HIGH (ref 0.61–1.12)

## 2023-11-22 LAB — TSH: TSH: 0.221 u[IU]/mL — ABNORMAL LOW (ref 0.350–4.500)

## 2023-11-22 MED ORDER — PANTOPRAZOLE SODIUM 40 MG PO TBEC
40.0000 mg | DELAYED_RELEASE_TABLET | Freq: Every day | ORAL | Status: DC
  Administered 2023-11-22 – 2023-11-25 (×4): 40 mg via ORAL
  Filled 2023-11-22 (×4): qty 1

## 2023-11-22 MED ORDER — AMLODIPINE BESYLATE 10 MG PO TABS
10.0000 mg | ORAL_TABLET | Freq: Every day | ORAL | Status: DC
  Administered 2023-11-22 – 2023-11-25 (×4): 10 mg via ORAL
  Filled 2023-11-22 (×4): qty 1

## 2023-11-22 MED ORDER — METOPROLOL SUCCINATE ER 50 MG PO TB24
50.0000 mg | ORAL_TABLET | Freq: Every day | ORAL | Status: DC
  Administered 2023-11-22 – 2023-11-25 (×4): 50 mg via ORAL
  Filled 2023-11-22 (×4): qty 1

## 2023-11-22 NOTE — Evaluation (Signed)
 Occupational Therapy Evaluation Patient Details Name: Terry [name] MRN: [id number] DOB: [birth year] Today's Date: 11/22/2023   History of Present Illness   Pt is a 89 y.o. male being admitted with a principal deficit of expressive aphasia. MRI and CT head negative. Admitted for stroke workup. PMH of DM, HTN, stage IIIb CKD with associated anemia, hypothyroidism, COPD     Clinical Impressions Pt was seen for OT evaluation this date. Pt is unable to provide any of his history d/t expressive aphasia, answers I don't know or I don't think so to most questions. Is able to state yes/no to simple questions and follow most simple commands during session, however intermittently noted with difficulty, e.g. when asked to touch his nose he touched his ear, chin, then lips. Pt presents with deficits in strength, expressive aphasia and balance, affecting safe and optimal ADL completion. Pt currently requires MOD I for bed mobility, Min/CGA for STS and taking a few lateral steps then turning to recliner via HHA x1. He required Max A for LB dressing to doff/don socks, as pt attempted to use his opposite foot to pull socks off (unsure of baseline function). Visual testing appears intact, able to track. Recommend ADL/IADL assist at home for safety. Pt would benefit from skilled OT services to address noted impairments and functional limitations to maximize safety and independence while minimizing future risk of falls, injury, and readmission. Do anticipate the need for follow up OT services upon acute hospital DC.      If plan is discharge home, recommend the following:   A little help with walking and/or transfers;A lot of help with bathing/dressing/bathroom;A little help with bathing/dressing/bathroom;Assistance with cooking/housework;Help with stairs or ramp for entrance     Functional Status Assessment   Patient has had a recent decline in their functional status and demonstrates the ability to make  significant improvements in function in a reasonable and predictable amount of time.     Equipment Recommendations         Recommendations for Other Services         Precautions/Restrictions   Precautions Precautions: Fall Restrictions Weight Bearing Restrictions Per Provider Order: No     Mobility Bed Mobility Overal bed mobility: Modified Independent                  Transfers Overall transfer level: Needs assistance Equipment used: None Transfers: Sit to/from Stand, Bed to chair/wheelchair/BSC Sit to Stand: Contact guard assist, Min assist           General transfer comment: upon initial stand noted with mild posterior lean, however progressed to CGA and able to take steps and turn to recliner with CGA via HHA      Balance Overall balance assessment: Needs assistance Sitting-balance support: Feet supported Sitting balance-Leahy Scale: Good     Standing balance support: Single extremity supported, During functional activity Standing balance-Leahy Scale: Fair Standing balance comment: CGA via HHA to step and pivot to recliner                           ADL either performed or assessed with clinical judgement   ADL Overall ADL's : Needs assistance/impaired                     Lower Body Dressing: Maximal assistance;Sitting/lateral leans Lower Body Dressing Details (indicate cue type and reason): unable to perform doffing of socks, attempted using other foot to take off sock--unsure  of baseline function Toilet Transfer: Stand-pivot;Cueing for safety;Minimal assistance;Contact guard assist Toilet Transfer Details (indicate cue type and reason): simulated bed to recliner, Min A to stand safely and CGA for step pivot to recliner         Functional mobility during ADLs: Minimal assistance;Contact guard assist;Cueing for safety       Vision   Vision Assessment?: Vision impaired- to be further tested in functional  context Additional Comments: difficult to assess d/t intermittent difficulty with command following during visual assessments, able to track     Perception         Praxis         Pertinent Vitals/Pain Pain Assessment Pain Assessment: Faces Faces Pain Scale: No hurt Pain Intervention(s): Monitored during session, Repositioned     Extremity/Trunk Assessment Upper Extremity Assessment Upper Extremity Assessment: Generalized weakness (ROM WFL/greater than 90 degrees but 3+/5 strength)   Lower Extremity Assessment Lower Extremity Assessment: Generalized weakness       Communication Communication Communication: Impaired Factors Affecting Communication: Difficulty expressing self   Cognition Arousal: Alert Behavior During Therapy: WFL for tasks assessed/performed               OT - Cognition Comments: answers to his name, but unable to state month, year, location, or name; mostly answers I dont know and did respond yes when asked if in the hospital while stating no to being at the grocery store, church, etc.                 Following commands: Impaired Following commands impaired: Follows one step commands with increased time     Cueing  General Comments   Cueing Techniques: Verbal cues  significant expressive aphasia   Exercises Other Exercises Other Exercises: Edu on role of OT in acute setting.   Shoulder Instructions      Home Living Family/patient expects to be discharged to:: Private residence Living Arrangements: Alone                                      Prior Functioning/Environment Prior Level of Function : Patient poor historian/Family not available             Mobility Comments: d/t expressive apahsia unable to gather any PLOF information, pt answers I don't know or I don't think so to most questions      OT Problem List: Decreased strength;Impaired balance (sitting and/or standing)   OT Treatment/Interventions:  Self-care/ADL training;Therapeutic exercise;Therapeutic activities;Balance training      OT Goals(Current goals can be found in the care plan section)   Acute Rehab OT Goals OT Goal Formulation: Patient unable to participate in goal setting Time For Goal Achievement: 12/06/23 Potential to Achieve Goals: Good ADL Goals Pt Will Perform Lower Body Bathing: with modified independence;sitting/lateral leans;sit to/from stand Pt Will Perform Lower Body Dressing: with modified independence;sitting/lateral leans;sit to/from stand Pt Will Transfer to Toilet: with modified independence;ambulating   OT Frequency:  Min 2X/week    Co-evaluation              AM-PAC OT 6 Clicks Daily Activity     Outcome Measure Help from another person eating meals?: None Help from another person taking care of personal grooming?: A Little Help from another person toileting, which includes using toliet, bedpan, or urinal?: A Little Help from another person bathing (including washing, rinsing, drying)?: A Lot Help from another person  to put on and taking off regular upper body clothing?: A Little Help from another person to put on and taking off regular lower body clothing?: A Lot 6 Click Score: 17   End of Session Equipment Utilized During Treatment: Gait belt Nurse Communication: Mobility status  Activity Tolerance: Patient tolerated treatment well Patient left: in chair (handoff to PT)  OT Visit Diagnosis: Other abnormalities of gait and mobility (R26.89);Cognitive communication deficit (R41.841) Symptoms and signs involving cognitive functions: Other cerebrovascular disease                Time: 9152-9091 OT Time Calculation (min): 21 min Charges:  OT General Charges $OT Visit: 1 Visit OT Evaluation $OT Eval Moderate Complexity: 1 Mod Christ Fullenwider, OTR/L 11/22/23, 9:56 AM  Duwaine FORBES Saupe 11/22/2023, 9:52 AM

## 2023-11-22 NOTE — Telephone Encounter (Signed)
 Copied from CRM [id number]. Topic: General - Other >> Nov 22, 2023 12:35 PM Alfonso HERO wrote: Reason for CRM: patients daughter n law called stating that the patient is in the hospital and had an MRI and havent heard anything about results. She is asking if Dr Marylynn can look into this and call them back with some results. Allena Michaelis [phone]

## 2023-11-22 NOTE — Plan of Care (Signed)
  Problem: Coping: Goal: Ability to adjust to condition or change in health will improve Outcome: Progressing   Problem: Fluid Volume: Goal: Ability to maintain a balanced intake and output will improve Outcome: Progressing   Problem: Health Behavior/Discharge Planning: Goal: Ability to identify and utilize available resources and services will improve Outcome: Progressing Goal: Ability to manage health-related needs will improve Outcome: Progressing   Problem: Metabolic: Goal: Ability to maintain appropriate glucose levels will improve Outcome: Progressing   Problem: Nutritional: Goal: Maintenance of adequate nutrition will improve Outcome: Progressing Goal: Progress toward achieving an optimal weight will improve Outcome: Progressing   Problem: Skin Integrity: Goal: Risk for impaired skin integrity will decrease Outcome: Progressing   Problem: Tissue Perfusion: Goal: Adequacy of tissue perfusion will improve Outcome: Progressing   Problem: Education: Goal: Knowledge of disease or condition will improve Outcome: Progressing Goal: Knowledge of secondary prevention will improve (MUST DOCUMENT ALL) Outcome: Progressing Goal: Knowledge of patient specific risk factors will improve (DELETE if not current risk factor) Outcome: Progressing   Problem: Nutrition: Goal: Dietary intake will improve Outcome: Progressing

## 2023-11-22 NOTE — Progress Notes (Signed)
  PROGRESS NOTE    [name]  FMW:[id number] DOB: October 09, 1933 DOA: 11/21/2023 PCP: Marylynn Verneita CROME, MD  121A/[id number]-BB  LOS: 0 days   Brief hospital course:   Assessment & Plan: Terry [name] is a 89 y.o. male with medical history significant for DM, HTN, stage IIIb CKD with associated anemia, hypothyroidism, COPD being admitted with a suspected acute stroke with a principal deficit of expressive aphasia.  Last known well was on 10/22.  On the day of arrival, 10/23, neighbor noticed that patient had not put the trash out and went to check on him and noticed he was having difficulty answering his questions and getting the right words out.  He was able to follow commands and ambulated to the ambulance with assistance.    Expressive Aphasia  Acute CVA, ruled out --MRI brain neg for stroke.  Symptoms persisted, so not TIA.  Unlikely to be seizure since pt can focus attention and appears to understand speech.  Of note, pt recently lost his wife on 11/06/2023, and was living alone. --neuro consult  Diabetes mellitus, type II (HCC) --ACHS and SSI --obtain A1c  Hypertension --cont amlodipine  and Toprol   Stage 3b chronic kidney disease (HCC) Anemia of CKD 3B At baseline  COPD (chronic obstructive pulmonary disease) (HCC) Not acutely exacerbated DuoNebs as needed  Underweight (BMI < 18.5) Consider Ensure when cleared to eat  Hyperthyroidism Multinodular goiter Continue methimazole  --TSH and free T4   DVT prophylaxis: Lovenox SQ Code Status: DNR  Family Communication: daughter updated on the phone today Level of care: Telemetry Medical Dispo:   The patient is from: home Anticipated d/c is to: to be determined Anticipated d/c date is: 1-2 days   Subjective and Interval History:  Pt continued to have speech difficulty, speech incomprehensible.  RN reported pt couldn't use utensils in the morning to feed himself, however, by lunch time, was able to feed himself.  Pt  appeared to understand speech.   Objective: Vitals:   11/22/23 0845 11/22/23 1246 11/22/23 1645 11/22/23 2021  BP: (!) 161/89 (!) 173/86 (!) 159/88 (!) 146/87  Pulse: 94 96 87 85  Resp: 16 18 18 17   Temp: 99.2 F (37.3 C) 98.7 F (37.1 C) 98.5 F (36.9 C) 98.4 F (36.9 C)  TempSrc:  Oral Oral   SpO2: 94% 96% 93% 95%  Weight:      Height:        Intake/Output Summary (Last 24 hours) at 11/22/2023 2041 Last data filed at 11/22/2023 0900 Gross per 24 hour  Intake 360 ml  Output --  Net 360 ml   Filed Weights   11/21/23 1705  Weight: 57.3 kg    Examination:   Constitutional: NAD, alert, attempted to talk, but speech incomprehensible HEENT: conjunctivae and lids normal, EOMI CV: No cyanosis.   RESP: normal respiratory effort, on RA   Data Reviewed: I have personally reviewed labs and imaging studies  Time spent: 50 minutes  Ellouise Haber, MD [facility] If 7PM-7AM, please contact night-coverage 11/22/2023, 8:41 PM

## 2023-11-22 NOTE — Evaluation (Signed)
 Speech Language Pathology Evaluation Patient Details Name: Terry [name] MRN: [name] DOB: [birth year] Today's Date: 11/22/2023 Time: 1240-1300 SLP Time Calculation (min) (ACUTE ONLY): 20 min  Problem List:  Patient Active Problem List   Diagnosis Date Noted   Acute CVA (cerebrovascular accident) (HCC) 11/21/2023   Stage 3b chronic kidney disease (HCC) 11/21/2023   Anemia of chronic renal failure, stage 3b (HCC) 11/21/2023   Underweight (BMI < 18.5) 11/21/2023   Diabetes mellitus, type II (HCC) 11/21/2023   Vitamin D  deficiency 07/21/2023   Muscle cramps 06/25/2023   Dysuria 02/14/2023   DNR no code (do not resuscitate) 11/02/2020   Coronary atherosclerosis due to calcified coronary lesion of native artery 04/09/2020   Diabetic nephropathy associated with type 2 diabetes mellitus (HCC) 04/27/2018   History of colonic polyps 08/07/2016   Recent unintentional weight loss over several months 07/24/2016   Bilateral carotid artery stenosis 05/31/2015   Anginal equivalent 03/01/2015   Acquired bladder diverticulum 11/22/2013   S/P appendectomy 11/22/2013   Enlarged prostate with urinary retention 11/12/2013   B12 deficiency 07/20/2013   Toxic multinodular goiter 07/20/2013   Health care maintenance 11/12/2012   COPD (chronic obstructive pulmonary disease) (HCC) 12/22/2011   Type 2 DM with CKD stage 3 and hypertension (HCC) 06/28/2011   Hyperlipidemia with target LDL less than 70 06/28/2011   Hypertension 06/28/2011   Hyperthyroidism 06/28/2011   History of esophagitis 06/28/2011   History of alcohol abuse 06/28/2011   Anemia    Past Medical History:  Past Medical History:  Diagnosis Date   Anemia    COPD (chronic obstructive pulmonary disease) (HCC)    Diabetes mellitus 2008   GERD (gastroesophageal reflux disease)    Hyperlipidemia    Hypertension    hyperthyroidism    Squamous cell carcinoma of skin of left upper arm November 2015   Excised to negative margins,  associated keratoacanthoma.   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 Christ. He is due for followup colonoscopy    Past Surgical History:  Past Surgical History:  Procedure Laterality Date   APPENDECTOMY  1970   ARM SKIN LESION BIOPSY / EXCISION Left 2016   CATARACT EXTRACTION, BILATERAL  2012   Porfilio   COLONOSCOPY  12-10-12   COLONOSCOPY W/ POLYPECTOMY  2011   Dr. Eartha   COLONOSCOPY WITH PROPOFOL  N/A 09/19/2016   Procedure: COLONOSCOPY WITH PROPOFOL ;  Surgeon: Dessa Reyes ORN, MD;  Location: [facility] ENDOSCOPY;  Service: Endoscopy;  Laterality: N/A;   ESOPHAGOGASTRODUODENOSCOPY (EGD) WITH PROPOFOL  N/A 09/19/2016   Procedure: ESOPHAGOGASTRODUODENOSCOPY (EGD) WITH PROPOFOL ;  Surgeon: Dessa Reyes ORN, MD;  Location: [facility] ENDOSCOPY;  Service: Endoscopy;  Laterality: N/A;   HPI:  Pt is a 89 y.o. male being admitted with a principal deficit of expressive aphasia. MRI and CT head negative. Admitted for stroke workup. PMH of DM, HTN, stage IIIb CKD with associated anemia, hypothyroidism, COPD   Assessment / Plan / Recommendation Clinical Impression  Patient is presenting with Wernicke's type aphasia, with both expressive and receptive impairments. Patient is able to answer simple Y/N questions accurately, however is 0% accuracy with complex Y/N, and following basic commands. Speech is fluent and characterized by neologisms, jargon, empty speech. Question some level of ideomotor apraxia. Patient is perseverative when attempting to follow commands (Pointing repeatedly to the same object, easily stuck in set). Aside from automatic responses (Okay, thank you.), remainder of speech is incomprehensible. Stereotypic utterances perseverated throughout assessment: oh donno, oh conald,  be donno, cardo jon. Recommend SLP continue to follow during acute stay, will need SLP services upon d/c.    SLP Assessment  SLP Recommendation/Assessment: Patient needs  continued Speech Language Pathology Services SLP Visit Diagnosis: Aphasia (R47.01);Apraxia (R48.2)     Assistance Recommended at Discharge  Frequent or constant Supervision/Assistance  Functional Status Assessment Patient has had a recent decline in their functional status and demonstrates the ability to make significant improvements in function in a reasonable and predictable amount of time.  Frequency and Duration min 2x/week  2 weeks      SLP Evaluation Cognition  Overall Cognitive Status: No family/caregiver present to determine baseline cognitive functioning Arousal/Alertness: Awake/alert Orientation Level: Oriented to person       Comprehension  Auditory Comprehension Overall Auditory Comprehension: Impaired Yes/No Questions: Impaired (9/10) Basic Biographical Questions: 76-100% accurate Basic Immediate Environment Questions: 75-100% accurate Complex Questions: 0-24% accurate Commands: Impaired One Step Basic Commands: 0-24% accurate Two Step Basic Commands: 0-24% accurate Conversation: Simple Other Conversation Comments: nods, but does not appear to comprehend Visual Recognition/Discrimination Discrimination: Exceptions to [facility] Common Objects: Unable to indentify [employer] Drawings:  (2/3) Reading Comprehension Reading Status: Impaired Word level: Impaired    Expression Expression Primary Mode of Expression: Verbal Verbal Expression Overall Verbal Expression: Impaired Initiation: No impairment Automatic Speech:  (unable; attempted all) Level of Generative/Spontaneous Verbalization:  (fluent jargon) Repetition: Impaired Level of Impairment: Word level (0%, stereo typic utterance odonno, odonno) Naming: Impairment Responsive: 0-25% accurate Confrontation: Impaired Common Objects: Unable to indentify [employer] Drawings:  (2/3) Convergent: 0-24% accurate Divergent: 0-24% accurate Verbal Errors: Neologisms;Perseveration;Jargon;Not aware of  errors Pragmatics: No impairment Non-Verbal Means of Communication: Gestures Written Expression Dominant Hand: Right Written Expression: Exceptions to [facility] Self Formulation Ability:  (unable; attempted to write name; could not hold pen correctly)   Oral / Motor  Oral Motor/Sensory Function Overall Oral Motor/Sensory Function: Other (comment) (limited assessment due to difficulty following commands, perseveration) Motor Speech Overall Motor Speech:  (Difficult to assess due to langauge impairment however no dysarthria evident) Respiration: Within functional limits Phonation: Normal Resonance: Within functional limits Motor Planning:  (Unable to assess)   Ronal Landry [place], MS, CCC-SLP Speech-Language Pathologist Office: [phone] [facility]: [phone]          Ronal FORBES Scotland 11/22/2023, 1:14 PM

## 2023-11-22 NOTE — Evaluation (Signed)
 Physical Therapy Evaluation Patient Details Name: Terry [name] MRN: [id number] DOB: [birth year] Today's Date: 11/22/2023  History of Present Illness  Pt is a 89 y.o. male being admitted with a principal deficit of expressive aphasia. MRI and CT head negative. Admitted for stroke workup. PMH of DM, HTN, stage IIIb CKD with associated anemia, hypothyroidism, COPD  Clinical Impression  Pt in recliner on entry, finishing up with OT assessment. Pt remains with expressive language difficulty. Per OT, some milder signs of receptive language deficits suspected while showing limited success in command following, however in PT session, pt follows most basic cues for mobility. Pt able to demonstrate transfers and AMB that appear slow and tired, but not labored. Pt denies pain when asked, when asked how he's feeling AMB indicates so so.... Pt's face familiar with typical pt reports of being tired from being in the hospital and wanting to return to home, but he is short on words. Unable to gather more detailed assessment or report of baseline, but pt appears to be moving close to what I suspect is his most recent baseline. Will continue to follow while admitted and maximize opportunities for mobility to avoid any iatrogenic loss of mobility or function.       If plan is discharge home, recommend the following: A little help with walking and/or transfers;A little help with bathing/dressing/bathroom;Supervision due to cognitive status   Can travel by private vehicle        Equipment Recommendations None recommended by PT  Recommendations for Other Services       Functional Status Assessment Patient has had a recent decline in their functional status and demonstrates the ability to make significant improvements in function in a reasonable and predictable amount of time.     Precautions / Restrictions Precautions Precautions: Fall Restrictions Weight Bearing Restrictions Per Provider Order: No       Mobility  Bed Mobility                    Transfers Overall transfer level: Modified independent Equipment used: None               General transfer comment: appears steady upon rising    Ambulation/Gait Ambulation/Gait assistance: Supervision Gait Distance (Feet): 120 Feet Assistive device: None         General Gait Details: no LOB, moves slowly, no overt hemiweakness. Pt has heavy language deficits and can offer very little feedback overall  Stairs            Wheelchair Mobility     Tilt Bed    Modified Rankin (Stroke Patients Only)       Balance Overall balance assessment: Modified Independent                                           Pertinent Vitals/Pain Pain Assessment Pain Assessment: No/denies pain    Home Living Family/patient expects to be discharged to:: Private residence Living Arrangements: Alone                      Prior Function                       Extremity/Trunk Assessment                Communication   Communication Factors Affecting Communication: Difficulty expressing self  Cognition Arousal: Alert Behavior During Therapy: WFL for tasks assessed/performed   PT - Cognitive impairments: Difficult to assess                                 Cueing       General Comments      Exercises     Assessment/Plan    PT Assessment Patient needs continued PT services  PT Problem List Decreased activity tolerance;Decreased balance;Decreased mobility;Decreased cognition;Decreased knowledge of precautions       PT Treatment Interventions Functional mobility training;Therapeutic activities;Therapeutic exercise;Balance training;Patient/family education;Cognitive remediation    PT Goals (Current goals can be found in the Care Plan section)  Acute Rehab PT Goals PT Goal Formulation: Patient unable to participate in goal setting    Frequency Min 3X/week      Co-evaluation               AM-PAC PT 6 Clicks Mobility  Outcome Measure Help needed turning from your back to your side while in a flat bed without using bedrails?: None Help needed moving from lying on your back to sitting on the side of a flat bed without using bedrails?: None Help needed moving to and from a bed to a chair (including a wheelchair)?: None Help needed standing up from a chair using your arms (e.g., wheelchair or bedside chair)?: None Help needed to walk in hospital room?: A Little Help needed climbing 3-5 steps with a railing? : A Little 6 Click Score: 22    End of Session   Activity Tolerance: Patient tolerated treatment well;No increased pain Patient left: in chair;with nursing/sitter in room;with chair alarm set Nurse Communication: Mobility status PT Visit Diagnosis: Other symptoms and signs involving the nervous system (R29.898)    Time: 0900-0910 PT Time Calculation (min) (ACUTE ONLY): 10 min   Charges:   PT Evaluation $PT Eval Low Complexity: 1 Low   PT General Charges $$ ACUTE PT VISIT: 1 Visit       9:33 AM, 11/22/23 Peggye JAYSON Linear, PT, DPT Physical Therapist - [facility]  [phone] (ASCOM)    Ascencion Coye C 11/22/2023, 9:27 AM

## 2023-11-22 NOTE — Telephone Encounter (Signed)
 Spoke to pt daughter and informed her that  when the results are released the Hospitalist will contact her  with results per Dr Marylynn

## 2023-11-23 ENCOUNTER — Observation Stay

## 2023-11-23 DIAGNOSIS — R4701 Aphasia: Secondary | ICD-10-CM | POA: Diagnosis not present

## 2023-11-23 DIAGNOSIS — I639 Cerebral infarction, unspecified: Secondary | ICD-10-CM | POA: Diagnosis not present

## 2023-11-23 LAB — HEMOGLOBIN A1C
Hgb A1c MFr Bld: 6.7 % — ABNORMAL HIGH (ref 4.8–5.6)
Mean Plasma Glucose: 145.59 mg/dL

## 2023-11-23 LAB — GLUCOSE, CAPILLARY
Glucose-Capillary: 128 mg/dL — ABNORMAL HIGH (ref 70–99)
Glucose-Capillary: 196 mg/dL — ABNORMAL HIGH (ref 70–99)
Glucose-Capillary: 171 mg/dL — ABNORMAL HIGH (ref 70–99)
Glucose-Capillary: 145 mg/dL — ABNORMAL HIGH (ref 70–99)

## 2023-11-23 MED ORDER — GADOBUTROL 1 MMOL/ML IV SOLN
6.0000 mL | Freq: Once | INTRAVENOUS | Status: AC | PRN
  Administered 2023-11-23: 6 mL via INTRAVENOUS

## 2023-11-23 NOTE — Progress Notes (Signed)
 Speech Language Pathology Treatment: Cognitive-Linguistic  Patient Details Name: Terry [name] MRN: [id number] DOB: 1933-12-31 Today's Date: 11/23/2023 Time: 1000-1025 SLP Time Calculation (min) (ACUTE ONLY): 25 min  Assessment / Plan / Recommendation Clinical Impression  Pt seen for follow up language intervention targeting graphic expression and reading comprehension and introduction of multimodal communication. Graphic expression significantly limited for simple expression challenge (name)- model and initial letters not aiding production. Reading, specifically oral reading with potential to assist verbal expression, though not consistent at this point at word level. Simple yes/no prompts continue to be relative strength with response verification and comprehension bolstered with use of single written word prompt (regarding pain, toileting needs, hunger, thirst, etc). Education shared with RN regarding use of written word to aid comprehension/expression of wants/needs. Plan to introduce simple communication board to aid multimodal means for communication. SLP will continue to follow acutely- recommend follow up SLP services.    HPI HPI: Pt is a 89 y.o. male being admitted with a principal deficit of expressive aphasia. MRI and CT head negative. Admitted for stroke workup. PMH of DM, HTN, stage IIIb CKD with associated anemia, hypothyroidism, COPD      SLP Plan  Continue with current plan of care          Recommendations                    Oral care BID;Patient independent with oral care   Frequent or constant Supervision/Assistance Aphasia (R47.01);Apraxia (R48.2)     Continue with current plan of care    Terry Szabo Clapp, MS, CCC-SLP Speech Language Pathologist Rehab Services; [facility] [phone] (ascom)   Terry [name]  11/23/2023, 10:49 AM

## 2023-11-23 NOTE — Progress Notes (Signed)
  PROGRESS NOTE    Terry [name]  FMW:[id number] DOB: [birth year] DOA: 11/21/2023 PCP: Marylynn Verneita CROME, MD  121A/121A-BB  LOS: 0 days   Brief hospital course:   Assessment & Plan: Terry [name] is a 89 y.o. male with medical history significant for DM, HTN, stage IIIb CKD with associated anemia, hypothyroidism, COPD being admitted with a suspected acute stroke with a principal deficit of expressive aphasia.  Last known well was on 10/22.  On the day of arrival, 10/23, neighbor noticed that patient had not put the trash out and went to check on him and noticed he was having difficulty answering his questions and getting the right words out.  He was able to follow commands and ambulated to the ambulance with assistance.    Expressive Aphasia  Acute CVA, ruled out --MRI brain neg for stroke.  Symptoms persisted, so not TIA.  Unlikely to be seizure since pt can focus attention and appears to understand speech.  Of note, pt recently lost his wife on 11/06/2023, and was living alone. --neuro consult today  Diabetes mellitus, type II (HCC) --A1c 6.7 --ACHS and SSI  Hypertension --cont amlodipine  and Toprol   Stage 3b chronic kidney disease (HCC) Anemia of CKD 3B At baseline  COPD (chronic obstructive pulmonary disease) (HCC) Not acutely exacerbated DuoNebs as needed  Underweight (BMI < 18.5) Consider Ensure when cleared to eat  Hyperthyroidism Multinodular goiter --free T4 mildly elevated.  Pt may not have been taking his methimazole  at home --cont methimazole    DVT prophylaxis: Lovenox SQ Code Status: DNR  Family Communication: daughter updated at bedside today Level of care: Telemetry Medical Dispo:   The patient is from: home Anticipated d/c is to: home Anticipated d/c date is: 1-2 days   Subjective and Interval History:  Still can't form more than 2 words at a time.     Objective: Vitals:   11/23/23 0729 11/23/23 1114 11/23/23 1600 11/23/23 1955  BP: (!) 153/69  126/62 130/70 (!) 144/75  Pulse: 82 74 70 70  Resp: 18 18 18 18   Temp: 98.1 F (36.7 C) 97.8 F (36.6 C) 98.6 F (37 C) (!) 97.5 F (36.4 C)  TempSrc:    Oral  SpO2: 94% 96% 97% 93%  Weight:      Height:        Intake/Output Summary (Last 24 hours) at 11/23/2023 2036 Last data filed at 11/23/2023 1900 Gross per 24 hour  Intake 420 ml  Output --  Net 420 ml   Filed Weights   11/21/23 1705  Weight: 57.3 kg    Examination:   Constitutional: NAD, alert, can't form more than 2 words at a time. HEENT: conjunctivae and lids normal, EOMI CV: No cyanosis.   RESP: normal respiratory effort, on RA   Data Reviewed: I have personally reviewed labs and imaging studies  Time spent: 35 minutes  Ellouise Haber, MD [facility] If 7PM-7AM, please contact night-coverage 11/23/2023, 8:36 PM

## 2023-11-23 NOTE — Plan of Care (Signed)

## 2023-11-23 NOTE — Progress Notes (Signed)
 Physical Therapy Treatment Patient Details Name: Terry [name] MRN: [id number] DOB: 1933/06/28 Today's Date: 11/23/2023   History of Present Illness Pt is a 89 y.o. male being admitted with a principal deficit of expressive aphasia. MRI and CT head negative. Admitted for stroke workup. PMH of DM, HTN, stage IIIb CKD with associated anemia, hypothyroidism, COPD    PT Comments  Pt performs mobility at a Supervision- ModI level. Pt does demonstrate slower balance reactions which would make reaching outside of BOS, dynamic standing balance with narrow BOS or SLS increase fall risk.  Pt will benefit from continued PT services upon discharge to safely address deficits listed in patient problem list for decreased caregiver assistance and eventual return to PLOF.     If plan is discharge home, recommend the following: A little help with walking and/or transfers;A little help with bathing/dressing/bathroom;Supervision due to cognitive status   Can travel by private vehicle        Equipment Recommendations  None recommended by PT    Recommendations for Other Services       Precautions / Restrictions Precautions Precautions: Fall Restrictions Weight Bearing Restrictions Per Provider Order: No     Mobility  Bed Mobility               General bed mobility comments: NT- in recliner pre/post session    Transfers Overall transfer level: Needs assistance Equipment used: None Transfers: Sit to/from Stand, Bed to chair/wheelchair/BSC Sit to Stand: Supervision   Step pivot transfers: Supervision       General transfer comment: steady with transfers, no LOB    Ambulation/Gait Ambulation/Gait assistance: Supervision Gait Distance (Feet): 130 Feet Assistive device: None         General Gait Details: no LOB, moves slowly, no overt hemiweakness. Pt has heavy language deficits and can offer very little feedback overall   Stairs Stairs: Yes Stairs assistance:  Supervision Stair Management: Two rails, Alternating pattern Number of Stairs: 4     Wheelchair Mobility     Tilt Bed    Modified Rankin (Stroke Patients Only)       Balance Overall balance assessment: Needs assistance Sitting-balance support: Feet supported Sitting balance-Leahy Scale: Good     Standing balance support: Single extremity supported, During functional activity Standing balance-Leahy Scale: Good Standing balance comment: Slower balance reactions which would make reaching outside of BOS, dynamic standing balance with narrow BOS or SLS increase fall risk.                            Communication Communication Communication: Impaired Factors Affecting Communication: Difficulty expressing self  Cognition Arousal: Alert Behavior During Therapy: WFL for tasks assessed/performed   PT - Cognitive impairments: Difficult to assess                         Following commands: Impaired Following commands impaired: Follows one step commands with increased time    Cueing Cueing Techniques: Verbal cues  Exercises      General Comments        Pertinent Vitals/Pain Pain Assessment Pain Assessment: No/denies pain    Home Living                          Prior Function            PT Goals (current goals can now be found in the care plan section)  Acute Rehab PT Goals PT Goal Formulation: Patient unable to participate in goal setting    Frequency    Min 3X/week      PT Plan      Co-evaluation              AM-PAC PT 6 Clicks Mobility   Outcome Measure  Help needed turning from your back to your side while in a flat bed without using bedrails?: None Help needed moving from lying on your back to sitting on the side of a flat bed without using bedrails?: None Help needed moving to and from a bed to a chair (including a wheelchair)?: None Help needed standing up from a chair using your arms (e.g., wheelchair or  bedside chair)?: None Help needed to walk in hospital room?: A Little Help needed climbing 3-5 steps with a railing? : A Little 6 Click Score: 22    End of Session Equipment Utilized During Treatment: Gait belt Activity Tolerance: Patient tolerated treatment well;No increased pain Patient left: in chair;with nursing/sitter in room;with chair alarm set Nurse Communication: Mobility status PT Visit Diagnosis: Other symptoms and signs involving the nervous system (R29.898)     Time: 1023-1040 PT Time Calculation (min) (ACUTE ONLY): 17 min  Charges:    $Therapeutic Activity: 8-22 mins PT General Charges $$ ACUTE PT VISIT: 1 Visit                    Harland Irving, PTA  11/23/23, 10:54 AM

## 2023-11-24 DIAGNOSIS — R4701 Aphasia: Secondary | ICD-10-CM | POA: Diagnosis not present

## 2023-11-24 DIAGNOSIS — I639 Cerebral infarction, unspecified: Secondary | ICD-10-CM | POA: Diagnosis not present

## 2023-11-24 LAB — VITAMIN B12: Vitamin B-12: 266 pg/mL (ref 180–914)

## 2023-11-24 LAB — GLUCOSE, CAPILLARY
Glucose-Capillary: 122 mg/dL — ABNORMAL HIGH (ref 70–99)
Glucose-Capillary: 247 mg/dL — ABNORMAL HIGH (ref 70–99)
Glucose-Capillary: 181 mg/dL — ABNORMAL HIGH (ref 70–99)
Glucose-Capillary: 142 mg/dL — ABNORMAL HIGH (ref 70–99)

## 2023-11-24 LAB — AMMONIA: Ammonia: <13 umol/L (ref 9–35)

## 2023-11-24 MED ORDER — POLYETHYLENE GLYCOL 3350 17 G PO PACK
17.0000 g | PACK | Freq: Two times a day (BID) | ORAL | Status: DC
  Administered 2023-11-24 – 2023-11-25 (×2): 17 g via ORAL
  Filled 2023-11-24 (×2): qty 1

## 2023-11-24 NOTE — Plan of Care (Signed)
 Problem: Education: Goal: Ability to describe self-care measures that may prevent or decrease complications (Diabetes Survival Skills Education) will improve 11/24/2023 0154 by Mick Donzell CROME, RN Outcome: Progressing 11/24/2023 0154 by Mick Donzell CROME, RN Outcome: Progressing Goal: Individualized Educational Video(s) 11/24/2023 0154 by Mick Donzell CROME, RN Outcome: Progressing 11/24/2023 0154 by Mick Donzell CROME, RN Outcome: Progressing   Problem: Coping: Goal: Ability to adjust to condition or change in health will improve 11/24/2023 0154 by Mick Donzell CROME, RN Outcome: Progressing 11/24/2023 0154 by Mick Donzell CROME, RN Outcome: Progressing   Problem: Fluid Volume: Goal: Ability to maintain a balanced intake and output will improve 11/24/2023 0154 by Mick Donzell CROME, RN Outcome: Progressing 11/24/2023 0154 by Mick Donzell CROME, RN Outcome: Progressing   Problem: Health Behavior/Discharge Planning: Goal: Ability to identify and utilize available resources and services will improve 11/24/2023 0154 by Mick Donzell CROME, RN Outcome: Progressing 11/24/2023 0154 by Mick Donzell CROME, RN Outcome: Progressing Goal: Ability to manage health-related needs will improve 11/24/2023 0154 by Mick Donzell CROME, RN Outcome: Progressing 11/24/2023 0154 by Mick Donzell CROME, RN Outcome: Progressing   Problem: Metabolic: Goal: Ability to maintain appropriate glucose levels will improve 11/24/2023 0154 by Mick Donzell CROME, RN Outcome: Progressing 11/24/2023 0154 by Mick Donzell CROME, RN Outcome: Progressing   Problem: Nutritional: Goal: Maintenance of adequate nutrition will improve 11/24/2023 0154 by Mick Donzell CROME, RN Outcome: Progressing 11/24/2023 0154 by Mick Donzell CROME, RN Outcome: Progressing Goal: Progress toward achieving an optimal weight will improve 11/24/2023 0154 by Mick Donzell CROME, RN Outcome: Progressing 11/24/2023 0154 by Mick Donzell CROME, RN Outcome: Progressing   Problem: Skin  Integrity: Goal: Risk for impaired skin integrity will decrease 11/24/2023 0154 by Mick Donzell CROME, RN Outcome: Progressing 11/24/2023 0154 by Mick Donzell CROME, RN Outcome: Progressing   Problem: Tissue Perfusion: Goal: Adequacy of tissue perfusion will improve 11/24/2023 0154 by Mick Donzell CROME, RN Outcome: Progressing 11/24/2023 0154 by Mick Donzell CROME, RN Outcome: Progressing   Problem: Education: Goal: Knowledge of disease or condition will improve 11/24/2023 0154 by Mick Donzell CROME, RN Outcome: Progressing 11/24/2023 0154 by Mick Donzell CROME, RN Outcome: Progressing Goal: Knowledge of secondary prevention will improve (MUST DOCUMENT ALL) 11/24/2023 0154 by Mick Donzell CROME, RN Outcome: Progressing 11/24/2023 0154 by Mick Donzell CROME, RN Outcome: Progressing Goal: Knowledge of patient specific risk factors will improve (DELETE if not current risk factor) 11/24/2023 0154 by Mick Donzell CROME, RN Outcome: Progressing 11/24/2023 0154 by Mick Donzell CROME, RN Outcome: Progressing   Problem: Ischemic Stroke/TIA Tissue Perfusion: Goal: Complications of ischemic stroke/TIA will be minimized 11/24/2023 0154 by Mick Donzell CROME, RN Outcome: Progressing 11/24/2023 0154 by Mick Donzell CROME, RN Outcome: Progressing   Problem: Coping: Goal: Will verbalize positive feelings about self 11/24/2023 0154 by Mick Donzell CROME, RN Outcome: Progressing 11/24/2023 0154 by Mick Donzell CROME, RN Outcome: Progressing Goal: Will identify appropriate support needs 11/24/2023 0154 by Mick Donzell CROME, RN Outcome: Progressing 11/24/2023 0154 by Mick Donzell CROME, RN Outcome: Progressing   Problem: Health Behavior/Discharge Planning: Goal: Ability to manage health-related needs will improve 11/24/2023 0154 by Mick Donzell CROME, RN Outcome: Progressing 11/24/2023 0154 by Mick Donzell CROME, RN Outcome: Progressing Goal: Goals will be collaboratively established with patient/family 11/24/2023 0154 by Mick Donzell CROME,  RN Outcome: Progressing 11/24/2023 0154 by Mick Donzell CROME, RN Outcome: Progressing   Problem: Self-Care: Goal: Ability to participate in self-care as condition permits will improve 11/24/2023 0154 by Mick Donzell CROME, RN Outcome: Progressing 11/24/2023  0154 by Mick Donzell CROME, RN Outcome: Progressing Goal: Verbalization of feelings and concerns over difficulty with self-care will improve 11/24/2023 0154 by Mick Donzell CROME, RN Outcome: Progressing 11/24/2023 0154 by Mick Donzell CROME, RN Outcome: Progressing Goal: Ability to communicate needs accurately will improve 11/24/2023 0154 by Mick Donzell CROME, RN Outcome: Progressing 11/24/2023 0154 by Mick Donzell CROME, RN Outcome: Progressing   Problem: Nutrition: Goal: Risk of aspiration will decrease 11/24/2023 0154 by Mick Donzell CROME, RN Outcome: Progressing 11/24/2023 0154 by Mick Donzell CROME, RN Outcome: Progressing Goal: Dietary intake will improve 11/24/2023 0154 by Mick Donzell CROME, RN Outcome: Progressing 11/24/2023 0154 by Mick Donzell CROME, RN Outcome: Progressing   Problem: Education: Goal: Knowledge of General Education information will improve Description: Including pain rating scale, medication(s)/side effects and non-pharmacologic comfort measures 11/24/2023 0154 by Mick Donzell CROME, RN Outcome: Progressing 11/24/2023 0154 by Mick Donzell CROME, RN Outcome: Progressing   Problem: Health Behavior/Discharge Planning: Goal: Ability to manage health-related needs will improve 11/24/2023 0154 by Mick Donzell CROME, RN Outcome: Progressing 11/24/2023 0154 by Mick Donzell CROME, RN Outcome: Progressing   Problem: Clinical Measurements: Goal: Ability to maintain clinical measurements within normal limits will improve 11/24/2023 0154 by Mick Donzell CROME, RN Outcome: Progressing 11/24/2023 0154 by Mick Donzell CROME, RN Outcome: Progressing Goal: Will remain free from infection 11/24/2023 0154 by Mick Donzell CROME, RN Outcome: Progressing 11/24/2023  0154 by Mick Donzell CROME, RN Outcome: Progressing Goal: Diagnostic test results will improve 11/24/2023 0154 by Mick Donzell CROME, RN Outcome: Progressing 11/24/2023 0154 by Mick Donzell CROME, RN Outcome: Progressing Goal: Respiratory complications will improve 11/24/2023 0154 by Mick Donzell CROME, RN Outcome: Progressing 11/24/2023 0154 by Mick Donzell CROME, RN Outcome: Progressing Goal: Cardiovascular complication will be avoided 11/24/2023 0154 by Mick Donzell CROME, RN Outcome: Progressing 11/24/2023 0154 by Mick Donzell CROME, RN Outcome: Progressing   Problem: Activity: Goal: Risk for activity intolerance will decrease 11/24/2023 0154 by Mick Donzell CROME, RN Outcome: Progressing 11/24/2023 0154 by Mick Donzell CROME, RN Outcome: Progressing   Problem: Nutrition: Goal: Adequate nutrition will be maintained 11/24/2023 0154 by Mick Donzell CROME, RN Outcome: Progressing 11/24/2023 0154 by Mick Donzell CROME, RN Outcome: Progressing   Problem: Coping: Goal: Level of anxiety will decrease 11/24/2023 0154 by Mick Donzell CROME, RN Outcome: Progressing 11/24/2023 0154 by Mick Donzell CROME, RN Outcome: Progressing   Problem: Elimination: Goal: Will not experience complications related to bowel motility 11/24/2023 0154 by Mick Donzell CROME, RN Outcome: Progressing 11/24/2023 0154 by Mick Donzell CROME, RN Outcome: Progressing Goal: Will not experience complications related to urinary retention 11/24/2023 0154 by Mick Donzell CROME, RN Outcome: Progressing 11/24/2023 0154 by Mick Donzell CROME, RN Outcome: Progressing   Problem: Pain Managment: Goal: General experience of comfort will improve and/or be controlled 11/24/2023 0154 by Mick Donzell CROME, RN Outcome: Progressing 11/24/2023 0154 by Mick Donzell CROME, RN Outcome: Progressing   Problem: Safety: Goal: Ability to remain free from injury will improve 11/24/2023 0154 by Mick Donzell CROME, RN Outcome: Progressing 11/24/2023 0154 by Mick Donzell CROME, RN Outcome:  Progressing   Problem: Skin Integrity: Goal: Risk for impaired skin integrity will decrease 11/24/2023 0154 by Mick Donzell CROME, RN Outcome: Progressing 11/24/2023 0154 by Mick Donzell CROME, RN Outcome: Progressing

## 2023-11-24 NOTE — Progress Notes (Signed)
     [facility]IT[facility] REFERRAL        Occupational Therapy * Physical Therapy * Speech Therapy                           DATE: 11/24/2023  PATIENT NAME: Terry [name]. Christmas  PATIENT MRN: [id number]        DIAGNOSIS/DIAGNOSIS CODE: Acute CVA (HCC  DATE OF DISCHARGE: 11/24/2023       PRIMARY CARE PHYSICIAN: Terry [name]  PCP PHONE/FAX [phone]     Dear Provider (Name: [facility] outpatient __  Fax: [phone]   I certify that I have examined this patient and that occupational/physical/speech therapy is necessary on an outpatient basis.    The patient has expressed interest in completing their recommended course of therapy at your  location.  Once a formal order from the patient's primary care physician has been obtained, please  contact him/her to schedule an appointment for evaluation at your earliest convenience.   [ X]  Physical Therapy Evaluate and Treat  [  X]  Occupational Therapy Evaluate and Treat  [ X ]  Speech Therapy Evaluate and Treat         The patient's primary care physician (listed above) must furnish and be responsible for a formal order such that the recommended services may be furnished while under the primary physician's care, and that the plan of care will be established and reviewed every 30 days (or more often if condition necessitates).

## 2023-11-24 NOTE — Plan of Care (Signed)

## 2023-11-24 NOTE — Consult Note (Signed)
 NEUROLOGY CONSULTATION NOTE   Date of service: November 24, 2023 Patient Name: Terry [name] MRN:  [id number] DOB:  1933/12/18 Reason for consult: aphasia Requesting physician: Dr. Ellouise Haber _ _ _   _ __   _ __ _ _  __ __   _ __   __ _  History of Present Illness   Terry [name] is a 89 y.o. 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 who presents with  aphasia. He lives alone and has always been able to function very independently. His neighbor noticed that he did not take the trash out the day of admission. He was found unable to speak although he is able to nod yes or no to questions appropriately. On my examination he was unable to stick out his tongue to command and unable to show me two fingers although he was able to follow commands when I was conducting a formal strength exam. His wife passed away 3 weeks ago after 67 years of marriage.   ROS   Per HPI: all other systems reviewed and are negative  Past History   I have reviewed the following:  Past Medical History:  Diagnosis Date   Anemia    COPD (chronic obstructive pulmonary disease) (HCC)    Diabetes mellitus 2008   GERD (gastroesophageal reflux disease)    Hyperlipidemia    Hypertension    hyperthyroidism    Squamous cell carcinoma of skin of left upper arm November 2015   Excised to negative margins, associated keratoacanthoma.   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 Christ. He is due for followup colonoscopy    Past Surgical History:  Procedure Laterality Date   APPENDECTOMY  1970   ARM SKIN LESION BIOPSY / EXCISION Left 2016   CATARACT EXTRACTION, BILATERAL  2012   Porfilio    COLONOSCOPY  12-10-12   COLONOSCOPY W/ POLYPECTOMY  2011   Dr. Eartha   COLONOSCOPY WITH PROPOFOL  N/A 09/19/2016   Procedure: COLONOSCOPY WITH PROPOFOL ;  Surgeon: Dessa Reyes ORN, MD;  Location: [facility];  Service: Endoscopy;  Laterality: N/A;   ESOPHAGOGASTRODUODENOSCOPY (EGD) WITH PROPOFOL  N/A 09/19/2016   Procedure: ESOPHAGOGASTRODUODENOSCOPY (EGD) WITH PROPOFOL ;  Surgeon: Dessa Reyes ORN, MD;  Location: [facility] ENDOSCOPY;  Service: Endoscopy;  Laterality: N/A;   Family History  Problem Relation Age of Onset   Hypertension Father    Diabetes Maternal Aunt    Hyperlipidemia Maternal Uncle    Birth defects Neg Hx    Social History   Socioeconomic History   Marital status: Married    Spouse name: Not on file   Number of children: Not on file   Years of education: Not on file   Highest education level: Not on file  Occupational History   Not on file  Tobacco Use   Smoking status: Former    Current packs/day: 0.00    Average packs/day: 2.0 packs/day for 50.0 years (100.0 ttl pk-yrs)    Types: Cigarettes    Start date: 06/25/1949    Quit date: 06/26/1999    Years since quitting: 24.4   Smokeless tobacco: Never  Vaping Use   Vaping status: Never Used  Substance and Sexual Activity   Alcohol use: Not Currently  Drug use: No   Sexual activity: Yes  Other Topics Concern   Not on file  Social History Narrative   Not on file   Social Drivers of Health   Financial Resource Strain: Low Risk  (07/24/2023)   Overall Financial Resource Strain (CARDIA)    Difficulty of Paying Living Expenses: Not hard at all  Food Insecurity: No Food Insecurity (11/21/2023)   Hunger Vital Sign    Worried About Running Out of Food in the Last Year: Never true    Ran Out of Food in the Last Year: Never true  Transportation Needs: No Transportation Needs (11/21/2023)   PRAPARE - Administrator, Civil Service (Medical): No    Lack of Transportation (Non-Medical): No   Physical Activity: Sufficiently Active (07/24/2023)   Exercise Vital Sign    Days of Exercise per Week: 7 days    Minutes of Exercise per Session: 30 min  Stress: No Stress Concern Present (07/24/2023)   [employer] of Occupational Health - Occupational Stress Questionnaire    Feeling of Stress: Only a little  Social Connections: Socially Isolated (11/21/2023)   Social Connection and Isolation Panel    Frequency of Communication with Friends and Family: More than three times a week    Frequency of Social Gatherings with Friends and Family: Three times a week    Attends Religious Services: Never    Active Member of Clubs or Organizations: No    Attends Banker Meetings: Never    Marital Status: Widowed   No Known Allergies  Medications   Facility-Administered Medications Prior to Admission  Medication Dose Route Frequency Provider Last Rate Last Admin   ipratropium-albuterol  (DUONEB) 0.5-2.5 (3) MG/3ML nebulizer solution 3 mL  3 mL Nebulization Q6H Dineen Rollene MATSU, FNP       Medications Prior to Admission  Medication Sig Dispense Refill Last Dose/Taking   albuterol  (VENTOLIN  HFA) 108 (90 Base) MCG/ACT inhaler Inhale 2 puffs into the lungs every 6 (six) hours as needed for wheezing or shortness of breath. 3.7 g 11 Unknown   amLODipine  (NORVASC ) 10 MG tablet Take 1 tablet (10 mg total) by mouth daily. 90 tablet 1 Past Week   aspirin  EC 81 MG tablet Take 1 tablet (81 mg total) by mouth daily. 90 tablet 3 Past Week   ipratropium-albuterol  (DUONEB) 0.5-2.5 (3) MG/3ML SOLN Take 3 mLs by nebulization every 6 (six) hours as needed. 360 mL 2 Unknown   methimazole  (TAPAZOLE ) 5 MG tablet TAKE 1 TABLET (5 MG TOTAL) BY MOUTH DAILY. 90 tablet 1 Past Week   metoprolol  succinate (TOPROL -XL) 50 MG 24 hr tablet Take 1 tablet (50 mg total) by mouth daily. 90 tablet 1 Past Week   omeprazole  (PRILOSEC) 40 MG capsule Take 1 capsule (40 mg total) by mouth daily. 90 capsule 1 Past Week    telmisartan  (MICARDIS ) 80 MG tablet TAKE 1 TABLET BY MOUTH EVERYDAY AT BEDTIME 90 tablet 1 Past Week   Vitamin D , Ergocalciferol , (DRISDOL ) 1.25 MG (50000 UNIT) CAPS capsule TAKE 1 CAPSULE BY MOUTH ONE TIME PER WEEK 12 capsule 0 Past Week   b complex vitamins tablet Take 1 tablet by mouth daily. (Patient not taking: Reported on 11/21/2023)   Not Taking   budesonide  (PULMICORT ) 0.5 MG/2ML nebulizer solution Take 2 mLs (0.5 mg total) by nebulization daily. (Patient not taking: Reported on 11/21/2023) 60 mL 12 Not Taking   cyanocobalamin  1000 MCG tablet Take 1,000 mcg by mouth daily. (Patient not taking: Reported  on 11/21/2023)   Not Taking   JARDIANCE  10 MG TABS tablet TAKE 1 TABLET BY MOUTH DAILY BEFORE BREAKFAST. (Patient not taking: Reported on 11/21/2023) 30 tablet 0 Not Taking      Current Facility-Administered Medications:    acetaminophen (TYLENOL) tablet 650 mg, 650 mg, Oral, Q4H PRN **OR** acetaminophen (TYLENOL) 160 MG/5ML solution 650 mg, 650 mg, Per Tube, Q4H PRN **OR** acetaminophen (TYLENOL) suppository 650 mg, 650 mg, Rectal, Q4H PRN, Cleatus Delayne GAILS, MD   amLODipine  (NORVASC ) tablet 10 mg, 10 mg, Oral, Daily, Awanda [place], MD, 10 mg at 11/23/23 0836   aspirin  EC tablet 81 mg, 81 mg, Oral, Daily, Cleatus Delayne GAILS, MD, 81 mg at 11/23/23 0836   clopidogrel (PLAVIX) tablet 75 mg, 75 mg, Oral, Daily, Cleatus Delayne V, MD, 75 mg at 11/23/23 0836   enoxaparin (LOVENOX) injection 30 mg, 30 mg, Subcutaneous, Q24H, Cleatus Delayne V, MD, 30 mg at 11/23/23 2124   insulin aspart (novoLOG) injection 0-5 Units, 0-5 Units, Subcutaneous, QHS, Cleatus, Hazel V, MD   insulin aspart (novoLOG) injection 0-9 Units, 0-9 Units, Subcutaneous, TID WC, Cleatus Delayne GAILS, MD, 2 Units at 11/23/23 1726   ipratropium-albuterol  (DUONEB) 0.5-2.5 (3) MG/3ML nebulizer solution 3 mL, 3 mL, Nebulization, Q6H PRN, Cleatus Delayne GAILS, MD   methimazole  (TAPAZOLE ) tablet 5 mg, 5 mg, Oral, Daily, Cleatus Delayne V, MD, 5 mg at 11/23/23  9146   metoprolol  succinate (TOPROL -XL) 24 hr tablet 50 mg, 50 mg, Oral, Daily, Awanda [place], MD, 50 mg at 11/23/23 0836   pantoprazole (PROTONIX) EC tablet 40 mg, 40 mg, Oral, Daily, Awanda [place], MD, 40 mg at 11/23/23 0836  Vitals   Vitals:   11/23/23 1114 11/23/23 1600 11/23/23 1955 11/24/23 0407  BP: 126/62 130/70 (!) 144/75 (!) 149/73  Pulse: 74 70 70 79  Resp: 18 18 18 16   Temp: 97.8 F (36.6 C) 98.6 F (37 C) (!) 97.5 F (36.4 C) 97.8 F (36.6 C)  TempSrc:   Oral Oral  SpO2: 96% 97% 93% 96%  Weight:      Height:         Body mass index is 16.67 kg/m.  Physical Exam   Vitals:   11/23/23 1955 11/24/23 0407  BP: (!) 144/75 (!) 149/73  Pulse: 70 79  Resp: 18 16  Temp: (!) 97.5 F (36.4 C) 97.8 F (36.6 C)  SpO2: 93% 96%    Gen: patient lying in bed, NAD CV: extremities appear well-perfused Resp: normal WOB  Neurologic exam MS: alert, does not speak but is able to nod yes or no in response to questions appropriately  Speech: does not speak CN: PERRL, VFF, EOMI, sensation intact, face symmetric, hearing intact to voice Motor: 5/5 strength throughout Sensory: SILT Reflexes: 2+ symm with toes down bilat Coordination: UTA Gait: deferred   Labs   CBC:  Recent Labs  Lab 11/21/23 1707  WBC 10.0  NEUTROABS 7.6  HGB 11.7*  HCT 34.6*  MCV 94.8  PLT 360    Basic Metabolic Panel:  Lab Results  Component Value Date   NA 135 11/21/2023   K 4.2 11/21/2023   CO2 22 11/21/2023   GLUCOSE 149 (H) 11/21/2023   BUN 29 (H) 11/21/2023   CREATININE 1.44 (H) 11/21/2023   CALCIUM  9.4 11/21/2023   GFRNONAA 47 (L) 11/21/2023   GFRAA 60 08/06/2016   Lipid Panel:  Lab Results  Component Value Date   LDLCALC 87 11/22/2023   HgbA1c:  Lab Results  Component Value  Date   HGBA1C 6.7 (H) 11/22/2023   Urine Drug Screen:     Component Value Date/Time   LABOPIA NONE DETECTED 11/21/2023 2020   COCAINSCRNUR NONE DETECTED 11/21/2023 2020   LABBENZ NONE DETECTED  11/21/2023 2020   AMPHETMU NONE DETECTED 11/21/2023 2020   THCU NONE DETECTED 11/21/2023 2020   LABBARB NONE DETECTED 11/21/2023 2020    Alcohol Level No results found for: ETH   MRI Brain  No etiology for aphasia identified  Impression   Terry [name] is a 89 y.o. 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 who presents with aphasia. MRI brain showed no etiology for his aphasia. Recommend MRI brain w contrast, rEEG, and additional lab testing for cognitive impairment. I suspect based on the inconsistencies in his exam he may be experiencing the aphasia as part of a grief reaction to the loss of his wife.  Recommendations   MRI brain w contrast rEEG (Monday) RPR, vit B12, ammonia SLP cognitive linguistic eval ______________________________________________________________________   Thank you for the opportunity to take part in the care of this patient. If you have any further questions, please contact the neurology consultation attending.  Signed,  Elida Ross, MD [facility] [phone]  If 7pm- 7am, please page neurology on call as listed in AMION.  **Any copied and pasted documentation in this note was written by me in another application not billed for and pasted by me into this document.

## 2023-11-24 NOTE — Progress Notes (Signed)
  PROGRESS NOTE    Terry [name]  FMW:[id number] DOB: 01-May-1933 DOA: 11/21/2023 PCP: Marylynn Verneita CROME, MD  121A/121A-BB  LOS: 0 days   Brief hospital course:   Assessment & Plan: Terry [name] is a 89 y.o. male with medical history significant for DM, HTN, stage IIIb CKD with associated anemia, hypothyroidism, COPD being admitted with a suspected acute stroke with a principal deficit of expressive aphasia.  Last known well was on 10/22.  On the day of arrival, 10/23, neighbor noticed that patient had not put the trash out and went to check on him and noticed he was having difficulty answering his questions and getting the right words out.  He was able to follow commands and ambulated to the ambulance with assistance.    Expressive Aphasia  Acute CVA, ruled out --MRI brain neg for stroke.  Symptoms persisted, so not TIA.  Unlikely to be seizure since pt can focus attention and appears to understand speech.  Of note, pt recently lost his wife on 11/06/2023, and was living alone. --neuro consulted --MRI brain w contrast also wnl --EEG tomorrow  Diabetes mellitus, type II (HCC) --A1c 6.7 --ACHS and SSI  Hypertension --cont amlodipine  and Toprol   Stage 3b chronic kidney disease (HCC) Anemia of CKD 3B At baseline  COPD (chronic obstructive pulmonary disease) (HCC) Not acutely exacerbated DuoNebs as needed  Underweight (BMI < 18.5) Consider Ensure when cleared to eat  Hyperthyroidism Multinodular goiter --free T4 mildly elevated.  Pt may not have been taking his methimazole  at home --cont methimazole    DVT prophylaxis: Lovenox SQ Code Status: DNR  Family Communication: daughter updated at bedside today Level of care: Telemetry Medical Dispo:   The patient is from: home Anticipated d/c is to: home Anticipated d/c date is: 1-2 days   Subjective and Interval History:  Still having speech difficulties.   Objective: Vitals:   11/23/23 1955 11/24/23 0407 11/24/23 0815  11/24/23 1521  BP: (!) 144/75 (!) 149/73 (!) 163/75 127/67  Pulse: 70 79 79 68  Resp: 18 16 14 16   Temp: (!) 97.5 F (36.4 C) 97.8 F (36.6 C) 98.8 F (37.1 C) 98.3 F (36.8 C)  TempSrc: Oral Oral    SpO2: 93% 96% 94% 96%  Weight:      Height:        Intake/Output Summary (Last 24 hours) at 11/24/2023 1802 Last data filed at 11/24/2023 1743 Gross per 24 hour  Intake 180 ml  Output 150 ml  Net 30 ml   Filed Weights   11/21/23 1705  Weight: 57.3 kg    Examination:   Constitutional: NAD, alert, can't form sentences HEENT: conjunctivae and lids normal, EOMI CV: No cyanosis.   RESP: normal respiratory effort, on RA   Data Reviewed: I have personally reviewed labs and imaging studies  Time spent: 35 minutes  Terry Haber, MD [facility] If 7PM-7AM, please contact night-coverage 11/24/2023, 6:02 PM

## 2023-11-24 NOTE — Plan of Care (Signed)
  Problem: Education: Goal: Ability to describe self-care measures that may prevent or decrease complications (Diabetes Survival Skills Education) will improve Outcome: Progressing Goal: Individualized Educational Video(s) Outcome: Progressing   Problem: Coping: Goal: Ability to adjust to condition or change in health will improve Outcome: Progressing   Problem: Fluid Volume: Goal: Ability to maintain a balanced intake and output will improve Outcome: Progressing   Problem: Health Behavior/Discharge Planning: Goal: Ability to identify and utilize available resources and services will improve Outcome: Progressing Goal: Ability to manage health-related needs will improve Outcome: Progressing   Problem: Metabolic: Goal: Ability to maintain appropriate glucose levels will improve Outcome: Progressing   Problem: Nutritional: Goal: Maintenance of adequate nutrition will improve Outcome: Progressing Goal: Progress toward achieving an optimal weight will improve Outcome: Progressing   Problem: Skin Integrity: Goal: Risk for impaired skin integrity will decrease Outcome: Progressing   Problem: Education: Goal: Knowledge of disease or condition will improve Outcome: Progressing Goal: Knowledge of secondary prevention will improve (MUST DOCUMENT ALL) Outcome: Progressing Goal: Knowledge of patient specific risk factors will improve (DELETE if not current risk factor) Outcome: Progressing   Problem: Tissue Perfusion: Goal: Adequacy of tissue perfusion will improve Outcome: Progressing   Problem: Ischemic Stroke/TIA Tissue Perfusion: Goal: Complications of ischemic stroke/TIA will be minimized Outcome: Progressing   Problem: Coping: Goal: Will verbalize positive feelings about self Outcome: Progressing Goal: Will identify appropriate support needs Outcome: Progressing   Problem: Health Behavior/Discharge Planning: Goal: Ability to manage health-related needs will  improve Outcome: Progressing Goal: Goals will be collaboratively established with patient/family Outcome: Progressing   Problem: Self-Care: Goal: Ability to participate in self-care as condition permits will improve Outcome: Progressing Goal: Verbalization of feelings and concerns over difficulty with self-care will improve Outcome: Progressing Goal: Ability to communicate needs accurately will improve Outcome: Progressing   Problem: Nutrition: Goal: Risk of aspiration will decrease Outcome: Progressing Goal: Dietary intake will improve Outcome: Progressing   Problem: Education: Goal: Knowledge of General Education information will improve Description: Including pain rating scale, medication(s)/side effects and non-pharmacologic comfort measures Outcome: Progressing   Problem: Health Behavior/Discharge Planning: Goal: Ability to manage health-related needs will improve Outcome: Progressing   Problem: Clinical Measurements: Goal: Ability to maintain clinical measurements within normal limits will improve Outcome: Progressing Goal: Will remain free from infection Outcome: Progressing Goal: Diagnostic test results will improve Outcome: Progressing Goal: Respiratory complications will improve Outcome: Progressing Goal: Cardiovascular complication will be avoided Outcome: Progressing   Problem: Activity: Goal: Risk for activity intolerance will decrease Outcome: Progressing   Problem: Nutrition: Goal: Adequate nutrition will be maintained Outcome: Progressing   Problem: Coping: Goal: Level of anxiety will decrease Outcome: Progressing   Problem: Elimination: Goal: Will not experience complications related to bowel motility Outcome: Progressing Goal: Will not experience complications related to urinary retention Outcome: Progressing   Problem: Pain Managment: Goal: General experience of comfort will improve and/or be controlled Outcome: Progressing   Problem:  Safety: Goal: Ability to remain free from injury will improve Outcome: Progressing   Problem: Skin Integrity: Goal: Risk for impaired skin integrity will decrease Outcome: Progressing

## 2023-11-24 NOTE — Progress Notes (Signed)
 Occupational Therapy Treatment Patient Details Name: Terry [name] MRN: [id number] DOB: 06-21-1933 Today's Date: 11/24/2023   History of present illness Pt is a 89 y.o. male being admitted with a principal deficit of expressive aphasia. MRI and CT head negative. Admitted for stroke workup. PMH of DM, HTN, stage IIIb CKD with associated anemia, hypothyroidism, COPD   OT comments  Pt is supine in bed on arrival with daughter at bedside. Pleasant and agreeable to OT session. He denies pain. Pt performed bed mobility with MOD I. Pt required 2 attempts to reach standing with CGA, then ambulated to the bathroom to perform toileting and standing grooming tasks with CGA progressing to SBA. Pt follows one step directions well, but continues to have extensive expressive aphasia deficits. Updated PLOF since daughter present to provide history. Pt was fully IND with all tasks, driving and community mobile without AD use prior to admission. His wife recently passed 2 weeks ago. He ambulated ~180 ft with SBA, no LOB and mild unsteadiness. Pt left in recliner with all needs in place and will cont to require skilled acute OT services to maximize his safety and IND to return to PLOF.       If plan is discharge home, recommend the following:  A little help with walking and/or transfers;A little help with bathing/dressing/bathroom;Assistance with cooking/housework;Help with stairs or ramp for entrance   Equipment Recommendations  Tub/shower seat;BSC/3in1    Recommendations for Other Services      Precautions / Restrictions Precautions Precautions: Fall Recall of Precautions/Restrictions: Intact Restrictions Weight Bearing Restrictions Per Provider Order: No       Mobility Bed Mobility                    Transfers Overall transfer level: Needs assistance Equipment used: None Transfers: Sit to/from Stand Sit to Stand: Contact guard assist           General transfer comment: upon  initial stand, pt fell back onto bed, but stood again with CGA; ambulated to the bathroom and out into the hallway ~180 ft total with CGA/SBA     Balance Overall balance assessment: Needs assistance Sitting-balance support: Feet supported Sitting balance-Leahy Scale: Normal     Standing balance support: During functional activity, No upper extremity supported Standing balance-Leahy Scale: Fair Standing balance comment: mild balance impairment, but has a RW at home if needed; able to ambulate without BUE support with CGA to SBA                           ADL either performed or assessed with clinical judgement   ADL Overall ADL's : Needs assistance/impaired     Grooming: Wash/dry hands;Oral care;Standing;Supervision/safety Grooming Details (indicate cue type and reason): standing at sink in bathroom                 Toilet Transfer: Contact guard assist;Regular Network Engineer Details (indicate cue type and reason): via HHA, cues for grab bar use for safety                Extremity/Trunk Assessment              Vision Baseline Vision/History: 1 Wears glasses     Perception     Praxis     Communication Communication Communication: Impaired Factors Affecting Communication: Difficulty expressing self   Cognition Arousal: Alert Behavior During Therapy: [facility] for tasks assessed/performed  Following commands: Impaired Following commands impaired: Follows one step commands with increased time      Cueing   Cueing Techniques: Verbal cues  Exercises Other Exercises Other Exercises: Edu daughter on different settings for follow up therapies and available assist on DC.    Shoulder Instructions       General Comments      Pertinent Vitals/ Pain       Pain Assessment Pain Assessment: No/denies pain Pain Intervention(s): Monitored during session  Home Living Family/patient  expects to be discharged to:: Private residence ([facility]) Living Arrangements: Alone Available Help at Discharge: Neighbor Type of Home: House Home Access: Level entry     Home Layout: One level     Bathroom Shower/Tub: Producer, Television/film/video: Handicapped height     Home Equipment: Rollator (4 wheels);Grab bars - tub/shower;Wheelchair - manual          Prior Functioning/Environment              Frequency  Min 2X/week        Progress Toward Goals  OT Goals(current goals can now be found in the care plan section)  Progress towards OT goals: Progressing toward goals  Acute Rehab OT Goals OT Goal Formulation: With family Time For Goal Achievement: 12/06/23 Potential to Achieve Goals: Good  Plan      Co-evaluation                 AM-PAC OT 6 Clicks Daily Activity     Outcome Measure   Help from another person eating meals?: None Help from another person taking care of personal grooming?: A Little Help from another person toileting, which includes using toliet, bedpan, or urinal?: A Little Help from another person bathing (including washing, rinsing, drying)?: A Little Help from another person to put on and taking off regular upper body clothing?: A Little Help from another person to put on and taking off regular lower body clothing?: A Little 6 Click Score: 19    End of Session Equipment Utilized During Treatment: Gait belt  OT Visit Diagnosis: Other abnormalities of gait and mobility (R26.89);Cognitive communication deficit (R41.841)   Activity Tolerance Patient tolerated treatment well   Patient Left in chair;with chair alarm set;with family/visitor present   Nurse Communication Mobility status        Time: 8788-8746 OT Time Calculation (min): 42 min  Charges: OT General Charges $OT Visit: 1 Visit OT Treatments $Self Care/Home Management : 23-37 mins $Therapeutic Activity: 8-22 mins  Ulanda Tackett, OTR/L  11/24/23,  1:54 PM   Taisha Pennebaker E Arnecia Ector 11/24/2023, 1:49 PM

## 2023-11-25 ENCOUNTER — Observation Stay

## 2023-11-25 DIAGNOSIS — I639 Cerebral infarction, unspecified: Secondary | ICD-10-CM | POA: Diagnosis not present

## 2023-11-25 DIAGNOSIS — R569 Unspecified convulsions: Secondary | ICD-10-CM

## 2023-11-25 LAB — GLUCOSE, CAPILLARY
Glucose-Capillary: 129 mg/dL — ABNORMAL HIGH (ref 70–99)
Glucose-Capillary: 229 mg/dL — ABNORMAL HIGH (ref 70–99)

## 2023-11-25 LAB — RPR: RPR Ser Ql: NONREACTIVE

## 2023-11-25 MED ORDER — THIAMINE MONONITRATE 100 MG PO TABS
100.0000 mg | ORAL_TABLET | Freq: Every day | ORAL | Status: DC
  Administered 2023-11-25: 100 mg via ORAL
  Filled 2023-11-25: qty 1

## 2023-11-25 MED ORDER — POLYETHYLENE GLYCOL 3350 17 G PO PACK: 17.0000 g | PACK | Freq: Every day | ORAL | Status: DC | PRN

## 2023-11-25 MED ORDER — CYANOCOBALAMIN 1000 MCG PO TABS: 1000.0000 ug | ORAL_TABLET | Freq: Every day | ORAL | 2 refills | Status: DC

## 2023-11-25 MED ORDER — VITAMIN B-1 100 MG PO TABS: 100.0000 mg | ORAL_TABLET | Freq: Every day | ORAL | 2 refills | Status: AC

## 2023-11-25 NOTE — Progress Notes (Signed)
 Speech Language Pathology Treatment: Cognitive-Linguistic  Patient Details Name: JERET GOYER MRN: [id number] DOB: 1933/12/12 Today's Date: 11/25/2023 Time: 0930-0950 SLP Time Calculation (min) (ACUTE ONLY): 20 min  Assessment / Plan / Recommendation Clinical Impression  Pt seen for follow up aphasia intervention, targeting semantic feature analysis strategy and compensation for communication breakdown. Pt with significantly increased verbal expression this date up to phrase-sentence level. Min verbal cues provided for further expansion and use of semantic features to bolster expression. Pt noted to utilize gesture and synonym to compensate for expression. Overall, pt demonstrated independence with expressing medical questions and desires, drastically improved from initial SLP sessions. Recommend follow up SLP services at discharge. MD and TOC aware of recommendation.    HPI HPI: Pt is a 89 y.o. male being admitted with a principal deficit of expressive aphasia. MRI and CT head negative. Admitted for stroke workup. PMH of DM, HTN, stage IIIb CKD with associated anemia, hypothyroidism, COPD      SLP Plan  Continue with current plan of care          Recommendations                     Oral care BID;Patient independent with oral care   Frequent or constant Supervision/Assistance Aphasia (R47.01);Apraxia (R48.2)     Continue with current plan of care    Shauni Henner Clapp, MS, CCC-SLP Speech Language Pathologist Rehab Services; [facility] [phone] (ascom)   Annalyssa Thune J Clapp  11/25/2023, 1:35 PM

## 2023-11-25 NOTE — Progress Notes (Addendum)
 TOC Brief Assessment  Patient is from home and was admitted for stroke w/u. Weekend TOC setup outpatient therapy. Please outreach to [facility] if needs are identified.

## 2023-11-25 NOTE — Progress Notes (Signed)
 Patient is alert and oriented x4 and speaking in full sentences today. Patient is a little confused. It takes time for patient to get out some words but a big improvement.

## 2023-11-25 NOTE — Care Management Obs Status (Signed)
 MEDICARE OBSERVATION STATUS NOTIFICATION   Patient Details  Name: Terry [name] MRN: [id number] Date of Birth: 07-21-1933   Medicare Observation Status Notification Given:  Yes    Sina Lucchesi W, CMA 11/25/2023, 11:57 AM

## 2023-11-25 NOTE — Plan of Care (Signed)

## 2023-11-25 NOTE — Progress Notes (Signed)
 Occupational Therapy Treatment Patient Details Name: Terry [name] MRN: [id number] DOB: 1933/03/06 Today's Date: 11/25/2023   History of present illness Pt is a 89 y.o. male being admitted with a principal deficit of expressive aphasia. MRI and CT head negative. Admitted for stroke workup. PMH of DM, HTN, stage IIIb CKD with associated anemia, hypothyroidism, COPD   OT comments  Patient seen for OT treatment on this date. Upon arrival to room patient semi fowlers in bed, agreeable to treatment. Patient able to exit bed with bed flat, no A needed. Patient con versive, answering questions appropriately and easily and appears expression is improving significantly. See below for ADL performance. Patient performed all tasks with set up A/supervision, 1 cue needed for pacing and 1 cue for hand placement with transfers. Patient ended treatment in recliner with chair alarm on and all needs within reach. Patient making good progress toward goals, will continue to follow POC. Discharge recommendation remains appropriate.        If plan is discharge home, recommend the following:  A little help with walking and/or transfers;A little help with bathing/dressing/bathroom;Assistance with cooking/housework;Help with stairs or ramp for entrance   Equipment Recommendations  Tub/shower seat;BSC/3in1    Recommendations for Other Services      Precautions / Restrictions Precautions Precautions: Fall Recall of Precautions/Restrictions: Intact Restrictions Weight Bearing Restrictions Per Provider Order: No       Mobility Bed Mobility Overal bed mobility: Modified Independent             General bed mobility comments: bed flat, no A needed    Transfers Overall transfer level: Needs assistance Equipment used: None Transfers: Sit to/from Stand Sit to Stand: Supervision                 Balance Overall balance assessment: Needs assistance Sitting-balance support: Feet supported Sitting  balance-Leahy Scale: Normal     Standing balance support: During functional activity Standing balance-Leahy Scale: Good                             ADL either performed or assessed with clinical judgement   ADL Overall ADL's : Needs assistance/impaired     Grooming: Wash/dry hands;Oral care;Standing;Supervision/safety Grooming Details (indicate cue type and reason): standing at sink in bathroom             Lower Body Dressing: Sit to/from stand;Supervision/safety Lower Body Dressing Details (indicate cue type and reason): doffed/donned socks and underpants, cues for safety Toilet Transfer: Supervision/safety;BSC/3in1 Toilet Transfer Details (indicate cue type and reason): BSC placed over toilet                Extremity/Trunk Assessment Upper Extremity Assessment Upper Extremity Assessment: Overall WFL for tasks assessed   Lower Extremity Assessment Lower Extremity Assessment: Defer to PT evaluation        Vision       Perception     Praxis     Communication Communication Communication: No apparent difficulties   Cognition Arousal: Alert Behavior During Therapy: WFL for tasks assessed/performed Cognition: No apparent impairments                               Following commands: Intact Following commands impaired: Follows multi-step commands with increased time      Cueing   Cueing Techniques: Verbal cues  Exercises      Shoulder Instructions  General Comments      Pertinent Vitals/ Pain       Pain Assessment Pain Assessment: No/denies pain  Home Living                                          Prior Functioning/Environment              Frequency  Min 2X/week        Progress Toward Goals  OT Goals(current goals can now be found in the care plan section)  Progress towards OT goals: Progressing toward goals  Acute Rehab OT Goals OT Goal Formulation: With patient Time For Goal  Achievement: 12/06/23 Potential to Achieve Goals: Good ADL Goals Pt Will Perform Lower Body Bathing: with modified independence;sitting/lateral leans;sit to/from stand Pt Will Perform Lower Body Dressing: with modified independence;sitting/lateral leans;sit to/from stand Pt Will Transfer to Toilet: with modified independence;ambulating  Plan      Co-evaluation                 AM-PAC OT 6 Clicks Daily Activity     Outcome Measure   Help from another person eating meals?: None Help from another person taking care of personal grooming?: A Little Help from another person toileting, which includes using toliet, bedpan, or urinal?: A Little Help from another person bathing (including washing, rinsing, drying)?: A Little Help from another person to put on and taking off regular upper body clothing?: A Little Help from another person to put on and taking off regular lower body clothing?: A Little 6 Click Score: 19    End of Session Equipment Utilized During Treatment: Other (comment) (none)  OT Visit Diagnosis: Other abnormalities of gait and mobility (R26.89);Cognitive communication deficit (R41.841) Symptoms and signs involving cognitive functions: Other cerebrovascular disease   Activity Tolerance Patient tolerated treatment well   Patient Left in chair;with call bell/phone within reach;with chair alarm set   Nurse Communication          Time: [phone] OT Time Calculation (min): 26 min  Charges: OT General Charges $OT Visit: 1 Visit OT Treatments $Self Care/Home Management : 23-37 mins  Rogers Clause, OT/L MSOT, 11/25/2023

## 2023-11-25 NOTE — Progress Notes (Signed)
 Mobility Specialist Progress Note:    11/25/23 1018  Mobility  Activity Ambulated with assistance  Level of Assistance Contact guard assist, steadying assist  Assistive Device None  Distance Ambulated (ft) 20 ft  Range of Motion/Exercises Active;All extremities  Activity Response Tolerated well  Mobility visit 1 Mobility  Mobility Specialist Start Time (ACUTE ONLY) 1012  Mobility Specialist Stop Time (ACUTE ONLY) 1018  Mobility Specialist Time Calculation (min) (ACUTE ONLY) 6 min   Pt received requesting assistance to bathroom. Required CGA to stand and ambulate with no AD. Tolerated well, asx throughout. Returned to bed, transport in room to take pt to EEG. All needs met.  Sherrilee Ditty Mobility Specialist Please contact via Special Educational Needs Teacher or  Rehab office at [phone]

## 2023-11-25 NOTE — Procedures (Signed)
 Patient Name: VERNON MAISH  MRN: [id number]  Epilepsy Attending: Arlin MALVA Krebs  Referring Physician/Provider: Matthews Elida HERO, MD  Date: 11/25/2023 Duration: 29.50 mins  Patient history: 89yo M with aphasia. EEG to evaluate for seizure.  Level of alertness: Awake  AEDs during EEG study: None  Technical aspects: This EEG study was done with scalp electrodes positioned according to the 10-20 International system of electrode placement. Electrical activity was reviewed with band pass filter of 1-70Hz , sensitivity of 7 uV/mm, display speed of 25mm/sec with a 60Hz  notched filter applied as appropriate. EEG data were recorded continuously and digitally stored.  Video monitoring was available and reviewed as appropriate.  Description: The posterior dominant rhythm consists of 8 Hz activity of moderate voltage (25-35 uV) seen predominantly in posterior head regions, symmetric and reactive to eye opening and eye closing. There is 15 to 18 Hz beta activity distributed symmetrically and diffusely. Hyperventilation and photic stimulation were not performed.     IMPRESSION: This study is within normal limits. No seizures or epileptiform discharges were seen throughout the recording.  A normal interictal EEG does not exclude the diagnosis of epilepsy.   Ala Kratz O Tuyen Uncapher

## 2023-11-25 NOTE — Progress Notes (Signed)
 Neurology progress note  S: Aphasia unchanged  Vitals:   11/24/23 2050 11/25/23 0354  BP: (!) 148/75 (!) 141/76  Pulse: 70 78  Resp: 16 18  Temp: 98.1 F (36.7 C) 98 F (36.7 C)  SpO2: 95% 94%   Gen: patient lying in bed, NAD CV: extremities appear well-perfused Resp: normal WOB   Neurologic exam MS: alert, does not speak but is able to nod yes or no in response to questions appropriately  Speech: does not speak CN: PERRL, VFF, EOMI, sensation intact, face symmetric, hearing intact to voice Motor: 5/5 strength throughout Sensory: SILT Reflexes: 2+ symm with toes down bilat Coordination: UTA Gait: deferred   MRI brain wwo contrast: no etiology for aphasia identified  A/P: Terry [name] is a 89 y.o. 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 who presents with aphasia. MRI brain showed no etiology for his aphasia. Recommend rEEG, and additional lab testing for cognitive impairment. I suspect based on the inconsistencies in his exam he may be experiencing the aphasia as part of a grief reaction to the loss of his wife.   rEEG (Monday) RPR, vit B12, ammonia SLP cognitive linguistic eval  Elida Ross, MD [facility] [phone]  If 7pm- 7am, please page neurology on call as listed in AMION.

## 2023-11-25 NOTE — Plan of Care (Signed)
  Problem: Coping: Goal: Ability to adjust to condition or change in health will improve Outcome: Progressing   Problem: Metabolic: Goal: Ability to maintain appropriate glucose levels will improve Outcome: Progressing   Problem: Elimination: Goal: Will not experience complications related to bowel motility Outcome: Progressing   Problem: Pain Managment: Goal: General experience of comfort will improve and/or be controlled Outcome: Progressing   Problem: Safety: Goal: Ability to remain free from injury will improve Outcome: Progressing

## 2023-11-25 NOTE — Discharge Summary (Signed)
 Physician Discharge Summary   Terry [name]  male DOB: 1933/06/27  FMW:[id number]  PCP: Marylynn Verneita CROME, MD  Admit date: 11/21/2023 Discharge date: 11/25/2023  Admitted From: home Disposition:  home Daughter updated at bedside prior to discharge. Home Health: Yes CODE STATUS: DNR  Discharge Instructions     Ambulatory referral to Neurology   Complete by: As directed       Hospital Course:  For full details, please see H&P, progress notes, consult notes and ancillary notes.  Briefly,  Terry [name] is a 89 y.o. male with medical history significant for DM, HTN, stage IIIb CKD with associated anemia, hyperthyroidism, COPD being admitted with expressive aphasia.     Expressive Aphasia  Acute CVA, ruled out --MRI brain neg for stroke.  Symptoms persisted, so not TIA.  Unlikely to be seizure since pt can focus attention and appears to understand speech.  Of note, pt recently lost his wife on 11/06/2023, and was living alone. --neuro consulted --MRI brain w contrast also wnl --EEG no seizure activity. --speech improved prior to discharge.     Diabetes mellitus, type II (HCC) --A1c 6.7.   --pt not taking Jardiance  PTA.   Hypertension --cont amlodipine , Toprol .  Resume telmisartan  after discharge.   Stage 3b chronic kidney disease (HCC) Anemia of CKD 3B At baseline   COPD (chronic obstructive pulmonary disease) (HCC) --stable --pt not taking Pulmicort  PTA --cont DuoNeb PRN   Underweight (BMI < 18.5)   Hyperthyroidism Multinodular goiter --free T4 mildly elevated.  Pt was not taking his methimazole  PTA. --cont methimazole   Alcohol use --reported per daughter.  Pt started on thiamine.   Unless noted above, medications under STOP list are ones pt was not taking PTA.  Discharge Diagnoses:  Principal Problem:   Acute CVA (cerebrovascular accident) (HCC) Active Problems:   Hypertension   Diabetes mellitus, type II (HCC)   Stage 3b chronic kidney disease  (HCC)   Anemia of chronic renal failure, stage 3b (HCC)   COPD (chronic obstructive pulmonary disease) (HCC)   Underweight (BMI < 18.5)   Hyperthyroidism     Discharge Instructions:  Allergies as of 11/25/2023   No Known Allergies      Medication List     STOP taking these medications    b complex vitamins tablet   budesonide  0.5 MG/2ML nebulizer solution Commonly known as: Pulmicort    Jardiance  10 MG Tabs tablet Generic drug: empagliflozin        TAKE these medications    albuterol  108 (90 Base) MCG/ACT inhaler Commonly known as: VENTOLIN  HFA Inhale 2 puffs into the lungs every 6 (six) hours as needed for wheezing or shortness of breath.   amLODipine  10 MG tablet Commonly known as: NORVASC  Take 1 tablet (10 mg total) by mouth daily.   aspirin  EC 81 MG tablet Take 1 tablet (81 mg total) by mouth daily.   cyanocobalamin  1000 MCG tablet Take 1 tablet (1,000 mcg total) by mouth daily.   ipratropium-albuterol  0.5-2.5 (3) MG/3ML Soln Commonly known as: DUONEB Take 3 mLs by nebulization every 6 (six) hours as needed.   methimazole  5 MG tablet Commonly known as: TAPAZOLE  TAKE 1 TABLET (5 MG TOTAL) BY MOUTH DAILY.   metoprolol  succinate 50 MG 24 hr tablet Commonly known as: TOPROL -XL Take 1 tablet (50 mg total) by mouth daily.   omeprazole  40 MG capsule Commonly known as: PRILOSEC Take 1 capsule (40 mg total) by mouth daily.   polyethylene glycol 17 g packet Commonly known  as: MIRALAX  / GLYCOLAX  Take 17 g by mouth daily as needed.   telmisartan  80 MG tablet Commonly known as: MICARDIS  TAKE 1 TABLET BY MOUTH EVERYDAY AT BEDTIME   thiamine 100 MG tablet Commonly known as: Vitamin B-1 Take 1 tablet (100 mg total) by mouth daily. Start taking on: November 26, 2023   Vitamin D  (Ergocalciferol ) 1.25 MG (50000 UNIT) Caps capsule Commonly known as: DRISDOL  TAKE 1 CAPSULE BY MOUTH ONE TIME PER WEEK         Follow-up Information     Marylynn Verneita CROME,  MD Follow up.   Specialty: Internal Medicine Why: hospital follow up Contact information: [address] [place] [address] [phone]         Maree Jannett POUR, MD Follow up.   Specialty: Neurology Why: As needed Contact information: [address] [facility] [place] [address] [phone]                 No Known Allergies   The results of significant diagnostics from this hospitalization (including imaging, microbiology, ancillary and laboratory) are listed below for reference.   Consultations:   Procedures/Studies: EEG adult Result Date: 11/25/2023 Shelton Arlin KIDD, MD     11/25/2023 11:42 AM Patient Name: Terry [name] MRN: [id number] Epilepsy Attending: Arlin KIDD Shelton Referring Physician/Provider: Matthews Elida HERO, MD Date: 11/25/2023 Duration: 29.50 mins Patient history: 89yo M with aphasia. EEG to evaluate for seizure. Level of alertness: Awake AEDs during EEG study: None Technical aspects: This EEG study was done with scalp electrodes positioned according to the 10-20 International system of electrode placement. Electrical activity was reviewed with band pass filter of 1-70Hz , sensitivity of 7 uV/mm, display speed of 55mm/sec with a 60Hz  notched filter applied as appropriate. EEG data were recorded continuously and digitally stored.  Video monitoring was available and reviewed as appropriate. Description: The posterior dominant rhythm consists of 8 Hz activity of moderate voltage (25-35 uV) seen predominantly in posterior head regions, symmetric and reactive to eye opening and eye closing. There is 15 to 18 Hz beta activity distributed symmetrically and diffusely. Hyperventilation and photic stimulation were not performed.   IMPRESSION: This study is within normal limits. No seizures or epileptiform discharges were seen throughout the recording. A normal interictal EEG does not exclude the diagnosis of epilepsy.  Arlin KIDD Shelton   MR BRAIN W CONTRAST Result Date: 11/24/2023 EXAM: MRI BRAIN WITH CONTRAST 11/23/2023 09:03:51 PM TECHNIQUE: Multiplanar multisequence MRI of the head/brain was performed with the administration of intravenous contrast. COMPARISON: Non-contrast brain MRI 11/22/2023. CLINICAL HISTORY: 89 year old male with aphasia. No acute findings on non-contrast brain MRI 11/22/2023. Follow up postcontrast exam. FINDINGS: Stable brain volume and morphology. Following contrast the major dural venous sinuses are enhancing and appear patent. No abnormal intracranial enhancement. No abnormal dural thickening or enhancement. Negative for age visible cervical spine. IMPRESSION: 1. No abnormal enhancement or evidence of acute abnormality on postcontrast brain MRI. Electronically signed by: Helayne Hurst MD 11/24/2023 04:23 AM EDT RP Workstation: [id number]   ECHOCARDIOGRAM COMPLETE Result Date: 11/22/2023    ECHOCARDIOGRAM REPORT   Patient Name:   Terry [name] Date of Exam: 11/22/2023 Medical Rec #:  [id number]       Height:       73.0 in Accession #:    [id number]      Weight:       126.3 lb Date of Birth:  26-Jul-1933  BSA:          1.770 m Patient Age:    89 years        BP:           153/89 mmHg Patient Gender: M               HR:           96 bpm. Exam Location:  [facility] Procedure: 2D Echo and Color Doppler (Both Spectral and Color Flow Doppler were            utilized during procedure). Indications:     Stroke I63.9  History:         Patient has no prior history of Echocardiogram examinations.                  Stroke.  Sonographer:     Rosina Dunk Referring Phys:  [id number] DELAYNE LULLA SOLIAN Diagnosing Phys: [name]  Sonographer Comments: Technically difficult study due to poor echo windows. IMPRESSIONS  1. Technically very difficult study with no apical windows.  2. Left ventricle normal in size with probably mildly reduced systolic function based on limited views. Accurate LVEF assessment or  wall motion analysis cannot be performed.  3. Right ventricular systolic function is normal. The right ventricular size is normal.  4. The mitral valve is normal in structure. No evidence of mitral valve regurgitation.  5. The aortic valve is tricuspid. Aortic valve regurgitation is not visualized.  6. The inferior vena cava is dilated in size with <50% respiratory variability, suggesting right atrial pressure of 15 mmHg. FINDINGS  Left Ventricle: Left ventricle normal in size with probably mildly reduced systolic function based on limited views. Accurate LVEF assessment or wall motion analysis cannot be performed. Right Ventricle: The right ventricular size is normal. No increase in right ventricular wall thickness. Right ventricular systolic function is normal. Left Atrium: Left atrial size was not well visualized. Right Atrium: Right atrial size was not well visualized. Pericardium: Trivial pericardial effusion is present. Mitral Valve: The mitral valve is normal in structure. No evidence of mitral valve regurgitation. Tricuspid Valve: The tricuspid valve is normal in structure. Tricuspid valve regurgitation is not demonstrated. Aortic Valve: The aortic valve is tricuspid. Aortic valve regurgitation is not visualized. Pulmonic Valve: The pulmonic valve was not well visualized. Pulmonic valve regurgitation is not visualized. Aorta: The ascending aorta was not well visualized. Venous: The inferior vena cava is dilated in size with less than 50% respiratory variability, suggesting right atrial pressure of 15 mmHg. IAS/Shunts: The interatrial septum was not well visualized.  LEFT VENTRICLE PLAX 2D LVIDd:         5.00 cm LVIDs:         4.30 cm LV PW:         1.20 cm LV IVS:        1.30 cm LVOT diam:     2.30 cm LVOT Area:     4.15 cm  IVC IVC diam: 2.20 cm  AORTA Ao Root diam: 4.00 cm  SHUNTS Systemic Diam: 2.30 cm [name] Electronically signed by [name] Signature Date/Time: 11/22/2023/1:49:11 PM     Final    MR BRAIN WO CONTRAST Result Date: 11/22/2023 EXAM: MRI BRAIN WITHOUT CONTRAST 11/22/2023 01:54:24 AM TECHNIQUE: Multiplanar multisequence MRI of the head/brain was performed without the administration of intravenous contrast. COMPARISON: CT head 11/21/2023. CLINICAL HISTORY: Neuro deficit, acute, stroke suspected; new onset expressive aphasia. FINDINGS: BRAIN AND VENTRICLES: No acute  infarct. No intracranial hemorrhage. No mass. No midline shift. No hydrocephalus. Normal flow voids. ORBITS: No acute abnormality. SINUSES AND MASTOIDS: No acute abnormality. BONES AND SOFT TISSUES: Normal marrow signal. No acute soft tissue abnormality. IMPRESSION: 1. No acute intracranial abnormality. Electronically signed by: Gilmore Molt MD 11/22/2023 02:07 AM EDT RP Workstation: [id number]   DG Chest Port 1 View Result Date: 11/22/2023 CLINICAL DATA:  Stroke-like symptoms EXAM: PORTABLE CHEST 1 VIEW COMPARISON:  06/25/2023 FINDINGS: Cardiac shadow is stable. Aortic calcifications are seen. Lungs are hyperinflated. Nipple shadows are noted bilaterally. No focal infiltrate or effusion is seen. No bony abnormality is noted. IMPRESSION: COPD without acute abnormality. Electronically Signed   By: Oneil Devonshire M.D.   On: 11/22/2023 00:08   DG Abd 1 View Result Date: 11/22/2023 CLINICAL DATA:  Abdominal pain EXAM: ABDOMEN - 1 VIEW COMPARISON:  None Available. FINDINGS: Scattered large and small bowel gas is noted. Mild retained fecal material is noted within the left colon. No obstructive changes are seen. No free air is noted. Degenerative changes of lumbar spine are seen. IMPRESSION: No acute abnormality noted. Electronically Signed   By: Oneil Devonshire M.D.   On: 11/22/2023 00:08   CT HEAD WO CONTRAST Result Date: 11/21/2023 CLINICAL DATA:  Aphasia. EXAM: CT HEAD WITHOUT CONTRAST TECHNIQUE: Contiguous axial images were obtained from the base of the skull through the vertex without intravenous contrast.  RADIATION DOSE REDUCTION: This exam was performed according to the departmental dose-optimization program which includes automated exposure control, adjustment of the mA and/or kV according to patient size and/or use of iterative reconstruction technique. COMPARISON:  None Available. FINDINGS: Brain: There is generalized cerebral atrophy with widening of the extra-axial spaces and ventricular dilatation. There are areas of decreased attenuation within the white matter tracts of the supratentorial brain, consistent with microvascular disease changes. Vascular: Marked severity bilateral cavernous carotid artery calcification is noted. Skull: Normal. Negative for fracture or focal lesion. Sinuses/Orbits: No acute finding. Other: None. IMPRESSION: 1. Generalized cerebral atrophy and microvascular disease changes of the supratentorial brain. 2. No acute intracranial abnormality. Electronically Signed   By: Suzen Dials M.D.   On: 11/21/2023 18:03      Labs: BNP (last 3 results) No results for input(s): BNP in the last 8760 hours. Basic Metabolic Panel: Recent Labs  Lab 11/21/23 1707  NA 135  K 4.2  CL 98  CO2 22  GLUCOSE 149*  BUN 29*  CREATININE 1.44*  CALCIUM  9.4   Liver Function Tests: Recent Labs  Lab 11/21/23 1707  AST 20  ALT 10  ALKPHOS 59  BILITOT 1.4*  PROT 7.2  ALBUMIN 3.7   No results for input(s): LIPASE, AMYLASE in the last 168 hours. Recent Labs  Lab 11/24/23 0733  AMMONIA <13   CBC: Recent Labs  Lab 11/21/23 1707  WBC 10.0  NEUTROABS 7.6  HGB 11.7*  HCT 34.6*  MCV 94.8  PLT 360   Cardiac Enzymes: No results for input(s): CKTOTAL, CKMB, CKMBINDEX, TROPONINI in the last 168 hours. BNP: Invalid input(s): POCBNP CBG: Recent Labs  Lab 11/24/23 1200 11/24/23 1634 11/24/23 2128 11/25/23 0750 11/25/23 1219  GLUCAP 247* 181* 142* 129* 229*   D-Dimer No results for input(s): DDIMER in the last 72 hours. Hgb A1c No results for  input(s): HGBA1C in the last 72 hours. Lipid Profile No results for input(s): CHOL, HDL, LDLCALC, TRIG, CHOLHDL, LDLDIRECT in the last 72 hours. Thyroid  function studies No results for input(s): TSH, T4TOTAL, T3FREE, THYROIDAB in  the last 72 hours.  Invalid input(s): FREET3 Anemia work up Recent Labs    11/24/23 0732  VITAMINB12 266   Urinalysis    Component Value Date/Time   COLORURINE YELLOW (A) 11/21/2023 2020   APPEARANCEUR CLEAR (A) 11/21/2023 2020   APPEARANCEUR Clear 12/12/2012 0958   LABSPEC 1.017 11/21/2023 2020   LABSPEC 1.012 12/12/2012 0958   PHURINE 5.0 11/21/2023 2020   GLUCOSEU NEGATIVE 11/21/2023 2020   GLUCOSEU NEGATIVE 02/14/2023 1039   HGBUR NEGATIVE 11/21/2023 2020   BILIRUBINUR NEGATIVE 11/21/2023 2020   BILIRUBINUR neg 02/14/2023 1405   BILIRUBINUR Negative 12/12/2012 0958   KETONESUR 20 (A) 11/21/2023 2020   PROTEINUR >=300 (A) 11/21/2023 2020   UROBILINOGEN 0.2 02/14/2023 1405   UROBILINOGEN Normal 02/14/2023 1045   UROBILINOGEN 0.2 02/14/2023 1039   NITRITE NEGATIVE 11/21/2023 2020   LEUKOCYTESUR NEGATIVE 11/21/2023 2020   LEUKOCYTESUR Negative 12/12/2012 0958   Sepsis Labs Recent Labs  Lab 11/21/23 1707  WBC 10.0   Microbiology No results found for this or any previous visit (from the past 240 hours).   Total time spend on discharging this patient, including the last patient exam, discussing the hospital stay, instructions for ongoing care as it relates to all pertinent caregivers, as well as preparing the medical discharge records, prescriptions, and/or referrals as applicable, is 45 minutes.    Ellouise Haber, MD  [facility] 11/25/2023, 1:21 PM

## 2023-11-26 ENCOUNTER — Telehealth: Payer: Self-pay

## 2023-11-26 NOTE — Transitions of Care (Post Inpatient/ED Visit) (Signed)
 11/26/2023  Name: Terry [name] MRN: [id number] DOB: 09-22-1933  Today's TOC FU Call Status: Today's TOC FU Call Status:: Successful TOC FU Call Completed TOC FU Call Complete Date: 11/26/23 Patient's Name and Date of Birth confirmed.  Transition Care Management Follow-up Telephone Call Date of Discharge: 11/25/23 Discharge Facility: [facility]) Type of Discharge: Inpatient Admission Primary Inpatient Discharge Diagnosis:: cerebral infarction How have you been since you were released from the hospital?: Better Any questions or concerns?: No  Items Reviewed: Did you receive and understand the discharge instructions provided?: Yes Medications obtained,verified, and reconciled?: Yes (Medications Reviewed) Any new allergies since your discharge?: No Dietary orders reviewed?: Yes Do you have support at home?: Yes People in Home [RPT]: child(ren), adult  Medications Reviewed Today: Medications Reviewed Today     Reviewed by Emmitt Pan, LPN (Licensed Practical Nurse) on 11/26/23 at 1502  Med List Status: <None>   Medication Order Taking? Sig Documenting Provider Last Dose Status Informant  albuterol  (VENTOLIN  HFA) 108 (90 Base) MCG/ACT inhaler [id number] Yes Inhale 2 puffs into the lungs every 6 (six) hours as needed for wheezing or shortness of breath. Marylynn Verneita CROME, MD  Active Pharmacy Records  amLODipine  (NORVASC ) 10 MG tablet [id number] Yes Take 1 tablet (10 mg total) by mouth daily. Marylynn Verneita CROME, MD  Active Pharmacy Records  aspirin  EC 81 MG tablet [id number] Yes Take 1 tablet (81 mg total) by mouth daily. Darron Deatrice LABOR, MD  Active Pharmacy Records  cyanocobalamin  1000 MCG tablet [id number] Yes Take 1 tablet (1,000 mcg total) by mouth daily. Awanda [place], MD  Active   ipratropium-albuterol  (DUONEB) 0.5-2.5 (3) MG/3ML SOLN [id number] Yes Take 3 mLs by nebulization every 6 (six) hours as needed. Marylynn Verneita CROME, MD  Active Pharmacy Records   methimazole  (TAPAZOLE ) 5 MG tablet [id number] Yes TAKE 1 TABLET (5 MG TOTAL) BY MOUTH DAILY. Marylynn Verneita CROME, MD  Active Pharmacy Records  metoprolol  succinate (TOPROL -XL) 50 MG 24 hr tablet [id number] Yes Take 1 tablet (50 mg total) by mouth daily. Marylynn Verneita CROME, MD  Active Pharmacy Records  omeprazole  [facility]) 40 MG capsule [id number] Yes Take 1 capsule (40 mg total) by mouth daily. Marylynn Verneita CROME, MD  Active Pharmacy Records  polyethylene glycol (MIRALAX  / GLYCOLAX ) 17 g packet [id number] Yes Take 17 g by mouth daily as needed. Awanda [place], MD  Active   telmisartan  (MICARDIS ) 80 MG tablet [id number] Yes TAKE 1 TABLET BY MOUTH EVERYDAY AT BEDTIME Marylynn Verneita CROME, MD  Active Pharmacy Records  thiamine (VITAMIN B-1) 100 MG tablet [id number] Yes Take 1 tablet (100 mg total) by mouth daily. Awanda [place], MD  Active   Vitamin D , Ergocalciferol , (DRISDOL ) 1.25 MG (50000 UNIT) CAPS capsule [id number] Yes TAKE 1 CAPSULE BY MOUTH ONE TIME PER WEEK Marylynn Verneita CROME, MD  Active Pharmacy Records  Med List Note Christophe Verneita CROME, MD 04/12/19 1206): am            Home Care and Equipment/Supplies: Were Home Health Services Ordered?: NA Any new equipment or medical supplies ordered?: NA  Functional Questionnaire: Do you need assistance with bathing/showering or dressing?: No Do you need assistance with meal preparation?: No Do you need assistance with eating?: No Do you have difficulty maintaining continence: No Do you need assistance with getting out of bed/getting out of a chair/moving?: No Do you have difficulty managing or taking your medications?: No  Follow up appointments reviewed: PCP Follow-up appointment confirmed?: No (declined) MD Provider  Line Number:[phone] Given: No Specialist Hospital Follow-up appointment confirmed?: No Reason Specialist Follow-Up Not Confirmed: Patient has Specialist Provider Number and will Call for Appointment Do you need transportation to your follow-up  appointment?: No Do you understand care options if your condition(s) worsen?: Yes-patient verbalized understanding    SIGNATURE Julian Lemmings, LPN [facility] Nurse Health Advisor Direct Dial [phone]

## 2023-11-28 ENCOUNTER — Telehealth: Payer: Self-pay | Admitting: Internal Medicine

## 2023-11-28 NOTE — Telephone Encounter (Signed)
 FYI..Daughter of the pt came in to drop off FMLA forms to be filled out forms has been placed in the back mail box for Dr. Marylynn. Thanks

## 2023-11-29 LAB — VITAMIN B1: Vitamin B1 (Thiamine): 77.7 nmol/L (ref 66.5–200.0)

## 2023-12-03 ENCOUNTER — Ambulatory Visit: Admitting: Internal Medicine

## 2023-12-03 ENCOUNTER — Encounter: Payer: Self-pay | Admitting: Internal Medicine

## 2023-12-03 VITALS — BP 148/88 | HR 97 | Ht 73.0 in | Wt 137.8 lb

## 2023-12-03 DIAGNOSIS — J439 Emphysema, unspecified: Secondary | ICD-10-CM | POA: Diagnosis not present

## 2023-12-03 DIAGNOSIS — Z8673 Personal history of transient ischemic attack (TIA), and cerebral infarction without residual deficits: Secondary | ICD-10-CM

## 2023-12-03 DIAGNOSIS — N183 Chronic kidney disease, stage 3 unspecified: Secondary | ICD-10-CM | POA: Diagnosis not present

## 2023-12-03 DIAGNOSIS — F4320 Adjustment disorder, unspecified: Secondary | ICD-10-CM

## 2023-12-03 DIAGNOSIS — F1910 Other psychoactive substance abuse, uncomplicated: Secondary | ICD-10-CM

## 2023-12-03 DIAGNOSIS — I150 Renovascular hypertension: Secondary | ICD-10-CM

## 2023-12-03 DIAGNOSIS — Z09 Encounter for follow-up examination after completed treatment for conditions other than malignant neoplasm: Secondary | ICD-10-CM

## 2023-12-03 DIAGNOSIS — E538 Deficiency of other specified B group vitamins: Secondary | ICD-10-CM | POA: Diagnosis not present

## 2023-12-03 DIAGNOSIS — E1122 Type 2 diabetes mellitus with diabetic chronic kidney disease: Secondary | ICD-10-CM | POA: Diagnosis not present

## 2023-12-03 MED ORDER — OMEPRAZOLE 40 MG PO CPDR: 40.0000 mg | DELAYED_RELEASE_CAPSULE | Freq: Every day | ORAL | 1 refills | Status: AC

## 2023-12-03 MED ORDER — TELMISARTAN 80 MG PO TABS: ORAL_TABLET | ORAL | 1 refills | Status: DC

## 2023-12-03 MED ORDER — METOPROLOL SUCCINATE ER 50 MG PO TB24: 50.0000 mg | ORAL_TABLET | Freq: Every day | ORAL | 1 refills | Status: DC

## 2023-12-03 NOTE — Patient Instructions (Addendum)
 Resume tapazole ,  metoprolol  and telmisartan   Resume omeprazole   do not resume amlodipine   yet  return in 2 weeks for a BP Check  You don't need jardiance 

## 2023-12-03 NOTE — Progress Notes (Unsigned)
 Subjective:  Patient ID: Terry [name], male    DOB: 04-08-1933  Age: 89 y.o. MRN: [id number]  CC: There were no encounter diagnoses.   HPI Terry [name] presents for No chief complaint on file.   Hospital follow up.  Patient was admitted to [facility] on Oct 23 after being found unconscious by neighbor, with expressive aphasia and profound weakness concerning for acute CVA.  This occurred in the setting of profound grief following the unexpected death of his wife Terry [name] on Nov 02, 2025after a recent prolonged stay in a rehab facility  following her admission In Sept for vertebral fractures   Since  his wife's death at home , Terry [name] apparently stopped taking his medications and began  neglecting himself  after  his daughter  had returned home to [facility](history provided by  hospital notes).  MRI was negative for acute CVA.  He was given thiamine during brief admission.SABRA  His speech deficits were reported to have improved  prior to discharge and outpatient neurology referral was reportedly made  to Dr Jannett Fairly  as well as referrals for PT and OT,  but no appointment has yet to be set .  All medications were stopped at discharge except b12 .  Which is INCORRECT  HAS NOT BEEN TAKING JARDIANCE   DRINKING PREMIER SHAKES.  COOKING   / Lab Results  Component Value Date   HGBA1C 6.7 (H) 11/22/2023     He is accompanied by his daughter Terry [name],  who has been staying with Terry [name] since early October (she lives in [place] , [place])  Outpatient Medications Prior to Visit  Medication Sig Dispense Refill   albuterol  (VENTOLIN  HFA) 108 (90 Base) MCG/ACT inhaler Inhale 2 puffs into the lungs every 6 (six) hours as needed for wheezing or shortness of breath. 3.7 g 11   amLODipine  (NORVASC ) 10 MG tablet Take 1 tablet (10 mg total) by mouth daily. 90 tablet 1   aspirin  EC 81 MG tablet Take 1 tablet (81 mg total) by mouth daily. 90 tablet 3   cyanocobalamin  1000 MCG tablet Take 1 tablet (1,000 mcg total)  by mouth daily. 30 tablet 2   ipratropium-albuterol  (DUONEB) 0.5-2.5 (3) MG/3ML SOLN Take 3 mLs by nebulization every 6 (six) hours as needed. 360 mL 2   methimazole  (TAPAZOLE ) 5 MG tablet TAKE 1 TABLET (5 MG TOTAL) BY MOUTH DAILY. 90 tablet 1   metoprolol  succinate (TOPROL -XL) 50 MG 24 hr tablet Take 1 tablet (50 mg total) by mouth daily. 90 tablet 1   omeprazole  (PRILOSEC) 40 MG capsule Take 1 capsule (40 mg total) by mouth daily. 90 capsule 1   polyethylene glycol (MIRALAX  / GLYCOLAX ) 17 g packet Take 17 g by mouth daily as needed.     telmisartan  (MICARDIS ) 80 MG tablet TAKE 1 TABLET BY MOUTH EVERYDAY AT BEDTIME 90 tablet 1   thiamine (VITAMIN B-1) 100 MG tablet Take 1 tablet (100 mg total) by mouth daily. 30 tablet 2   Vitamin D , Ergocalciferol , (DRISDOL ) 1.25 MG (50000 UNIT) CAPS capsule TAKE 1 CAPSULE BY MOUTH ONE TIME PER WEEK 12 capsule 0   No facility-administered medications prior to visit.    Review of Systems;  Patient denies headache, fevers, malaise, unintentional weight loss, skin rash, eye pain, sinus congestion and sinus pain, sore throat, dysphagia,  hemoptysis , cough, dyspnea, wheezing, chest pain, palpitations, orthopnea, edema, abdominal pain, nausea, melena, diarrhea, constipation, flank pain, dysuria, hematuria, urinary  Frequency, nocturia, numbness, tingling, seizures,  Focal weakness, Loss of consciousness,  Tremor, insomnia, depression, anxiety, and suicidal ideation.      Objective:  There were no vitals taken for this visit.  BP Readings from Last 3 Encounters:  11/25/23 (!) 146/78  07/16/23 (!) 130/58  06/25/23 132/70    Wt Readings from Last 3 Encounters:  11/21/23 126 lb 5.2 oz (57.3 kg)  07/24/23 139 lb (63 kg)  07/16/23 137 lb 3.2 oz (62.2 kg)    Physical Exam  Lab Results  Component Value Date   HGBA1C 6.7 (H) 11/22/2023   HGBA1C 8.4 (H) 06/25/2023   HGBA1C 7.2 (H) 08/23/2022    Lab Results  Component Value Date   CREATININE 1.44 (H)  11/21/2023   CREATININE 1.45 07/18/2023   CREATININE 1.61 (H) 06/25/2023    Lab Results  Component Value Date   WBC 10.0 11/21/2023   HGB 11.7 (L) 11/21/2023   HCT 34.6 (L) 11/21/2023   PLT 360 11/21/2023   GLUCOSE 149 (H) 11/21/2023   CHOL 190 11/22/2023   TRIG 103 11/22/2023   HDL 82 11/22/2023   LDLDIRECT 103.0 06/25/2023   LDLCALC 87 11/22/2023   ALT 10 11/21/2023   AST 20 11/21/2023   NA 135 11/21/2023   K 4.2 11/21/2023   CL 98 11/21/2023   CREATININE 1.44 (H) 11/21/2023   BUN 29 (H) 11/21/2023   CO2 22 11/21/2023   TSH 0.221 (L) 11/22/2023   PSA 0.75 11/12/2013   INR 0.9 11/21/2023   HGBA1C 6.7 (H) 11/22/2023   MICROALBUR 84.0 (H) 06/25/2023    MR BRAIN W CONTRAST Result Date: 11/24/2023 EXAM: MRI BRAIN WITH CONTRAST 11/23/2023 09:03:51 PM TECHNIQUE: Multiplanar multisequence MRI of the head/brain was performed with the administration of intravenous contrast. COMPARISON: Non-contrast brain MRI 11/22/2023. CLINICAL HISTORY: 89 year old male with aphasia. No acute findings on non-contrast brain MRI 11/22/2023. Follow up postcontrast exam. FINDINGS: Stable brain volume and morphology. Following contrast the major dural venous sinuses are enhancing and appear patent. No abnormal intracranial enhancement. No abnormal dural thickening or enhancement. Negative for age visible cervical spine. IMPRESSION: 1. No abnormal enhancement or evidence of acute abnormality on postcontrast brain MRI. Electronically signed by: Helayne Hurst MD 11/24/2023 04:23 AM EDT RP Workstation: [id number]   ECHOCARDIOGRAM COMPLETE Result Date: 11/22/2023    ECHOCARDIOGRAM REPORT   Patient Name:   Terry [name] Date of Exam: 11/22/2023 Medical Rec #:  [id number]       Height:       73.0 in Accession #:    [id number]      Weight:       126.3 lb Date of Birth:  Jan 21, 1934        BSA:          1.770 m Patient Age:    89 years        BP:           153/89 mmHg Patient Gender: M               HR:           96  bpm. Exam Location:  [facility] Procedure: 2D Echo and Color Doppler (Both Spectral and Color Flow Doppler were            utilized during procedure). Indications:     Stroke I63.9  History:         Patient has no prior history of Echocardiogram examinations.  Stroke.  Sonographer:     Rosina Dunk Referring Phys:  [id number] DELAYNE LULLA SOLIAN Diagnosing Phys: [name]  Sonographer Comments: Technically difficult study due to poor echo windows. IMPRESSIONS  1. Technically very difficult study with no apical windows.  2. Left ventricle normal in size with probably mildly reduced systolic function based on limited views. Accurate LVEF assessment or wall motion analysis cannot be performed.  3. Right ventricular systolic function is normal. The right ventricular size is normal.  4. The mitral valve is normal in structure. No evidence of mitral valve regurgitation.  5. The aortic valve is tricuspid. Aortic valve regurgitation is not visualized.  6. The inferior vena cava is dilated in size with <50% respiratory variability, suggesting right atrial pressure of 15 mmHg. FINDINGS  Left Ventricle: Left ventricle normal in size with probably mildly reduced systolic function based on limited views. Accurate LVEF assessment or wall motion analysis cannot be performed. Right Ventricle: The right ventricular size is normal. No increase in right ventricular wall thickness. Right ventricular systolic function is normal. Left Atrium: Left atrial size was not well visualized. Right Atrium: Right atrial size was not well visualized. Pericardium: Trivial pericardial effusion is present. Mitral Valve: The mitral valve is normal in structure. No evidence of mitral valve regurgitation. Tricuspid Valve: The tricuspid valve is normal in structure. Tricuspid valve regurgitation is not demonstrated. Aortic Valve: The aortic valve is tricuspid. Aortic valve regurgitation is not visualized. Pulmonic Valve: The pulmonic valve  was not well visualized. Pulmonic valve regurgitation is not visualized. Aorta: The ascending aorta was not well visualized. Venous: The inferior vena cava is dilated in size with less than 50% respiratory variability, suggesting right atrial pressure of 15 mmHg. IAS/Shunts: The interatrial septum was not well visualized.  LEFT VENTRICLE PLAX 2D LVIDd:         5.00 cm LVIDs:         4.30 cm LV PW:         1.20 cm LV IVS:        1.30 cm LVOT diam:     2.30 cm LVOT Area:     4.15 cm  IVC IVC diam: 2.20 cm  AORTA Ao Root diam: 4.00 cm  SHUNTS Systemic Diam: 2.30 cm [name] Electronically signed by [name] Signature Date/Time: 11/22/2023/1:49:11 PM    Final    MR BRAIN WO CONTRAST Result Date: 11/22/2023 EXAM: MRI BRAIN WITHOUT CONTRAST 11/22/2023 01:54:24 AM TECHNIQUE: Multiplanar multisequence MRI of the head/brain was performed without the administration of intravenous contrast. COMPARISON: CT head 11/21/2023. CLINICAL HISTORY: Neuro deficit, acute, stroke suspected; new onset expressive aphasia. FINDINGS: BRAIN AND VENTRICLES: No acute infarct. No intracranial hemorrhage. No mass. No midline shift. No hydrocephalus. Normal flow voids. ORBITS: No acute abnormality. SINUSES AND MASTOIDS: No acute abnormality. BONES AND SOFT TISSUES: Normal marrow signal. No acute soft tissue abnormality. IMPRESSION: 1. No acute intracranial abnormality. Electronically signed by: Gilmore Molt MD 11/22/2023 02:07 AM EDT RP Workstation: [id number]    Assessment & Plan:  .There are no diagnoses linked to this encounter.   I spent 34 minutes on the day of this face to face encounter reviewing patient's  most recent visit with cardiology,  nephrology,  and neurology,  prior relevant surgical and non surgical procedures, recent  labs and imaging studies, counseling on weight management,  reviewing the assessment and plan with patient, and post visit ordering and reviewing of  diagnostics and therapeutics with  patient  .   Follow-up:  No follow-ups on file.   Verneita LITTIE Kettering, MD

## 2023-12-04 ENCOUNTER — Telehealth: Payer: Self-pay | Admitting: Internal Medicine

## 2023-12-04 NOTE — Telephone Encounter (Signed)
 Form has been faxed.

## 2023-12-04 NOTE — Telephone Encounter (Signed)
 Pt's daughter came back in to drop off completed FMLA forms they received yesterday to be faxed. Left on [employer] desk.

## 2023-12-05 ENCOUNTER — Telehealth: Payer: Self-pay | Admitting: Internal Medicine

## 2023-12-05 DIAGNOSIS — Z09 Encounter for follow-up examination after completed treatment for conditions other than malignant neoplasm: Secondary | ICD-10-CM | POA: Insufficient documentation

## 2023-12-05 DIAGNOSIS — Z8673 Personal history of transient ischemic attack (TIA), and cerebral infarction without residual deficits: Secondary | ICD-10-CM | POA: Insufficient documentation

## 2023-12-05 DIAGNOSIS — F4320 Adjustment disorder, unspecified: Secondary | ICD-10-CM | POA: Insufficient documentation

## 2023-12-05 DIAGNOSIS — F1910 Other psychoactive substance abuse, uncomplicated: Secondary | ICD-10-CM | POA: Insufficient documentation

## 2023-12-05 MED ORDER — EMPAGLIFLOZIN 10 MG PO TABS: 10.0000 mg | ORAL_TABLET | Freq: Every day | ORAL | 2 refills | Status: DC
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
## 2023-12-05 NOTE — Assessment & Plan Note (Addendum)
 Untreated currently due to lapse in medications.  Advised to resume metoprolol  and telmisartan ,  and  reassess need for lamlodipine in one week

## 2023-12-05 NOTE — Assessment & Plan Note (Addendum)
 No longer a candidate for metformin  due to weight loss and GFR,  but he never started jardiance  which was prescribed in June  for UaCr > 300.  Will recommend medication again   Lab Results  Component Value Date   HGBA1C 6.7 (H) 11/22/2023   Lab Results  Component Value Date   MICROALBUR 84.0 (H) 06/25/2023

## 2023-12-05 NOTE — Assessment & Plan Note (Addendum)
 Vs psychogenic reaction precipitated by alcohol abuse and profound frief.  His expressive aphasia  and profound weakness resolved  with supportive care during brief admission and MRI Brain was negative for acute CVA.  He as not discharged on plavix,  and was referred to neurology, PT and OT.

## 2023-12-05 NOTE — Assessment & Plan Note (Signed)
 Patient is stable post discharge ;  he  has no new issues but misunerstood directions at discharge and has not been taking ANY of his medications.   I have reviewed the records from the hospital admission in detail with patient today and restarted all medications except amlodipine 

## 2023-12-05 NOTE — Assessment & Plan Note (Signed)
 He has no reported history of withdrawal  and has reduced his intake following his recent admission.  He defers counselling

## 2023-12-05 NOTE — Assessment & Plan Note (Signed)
 Continue home nebulizer use for cost savings .  He has declined continued oxygen  supplementation and is aware  of his prognosis .

## 2023-12-05 NOTE — Assessment & Plan Note (Addendum)
 Advised to resume  b12  which was low on admission.  He is only taking thiamine  since discharge ,  which was normal on admission   Lab Results  Component Value Date   VITAMINB12 266 11/24/2023

## 2023-12-09 NOTE — Telephone Encounter (Signed)
 LMTCB. Please relay message below to pt.

## 2023-12-11 ENCOUNTER — Ambulatory Visit (INDEPENDENT_AMBULATORY_CARE_PROVIDER_SITE_OTHER)

## 2023-12-11 DIAGNOSIS — E538 Deficiency of other specified B group vitamins: Secondary | ICD-10-CM

## 2023-12-11 DIAGNOSIS — Z23 Encounter for immunization: Secondary | ICD-10-CM

## 2023-12-11 MED ORDER — CYANOCOBALAMIN 1000 MCG/ML IJ SOLN
1000.0000 ug | Freq: Once | INTRAMUSCULAR | Status: AC
  Administered 2023-12-11: 1000 ug via INTRAMUSCULAR

## 2023-12-11 NOTE — Progress Notes (Signed)
 Patient was administered a B12 injection into his Left deltoid. Patient tolerated the B12 injection well. B12 was administered per Dr. Lula telephone note from 12/05/23.   Patient was also administered a High dose flu vaccine into his left deltoid. Patient tolerated the High dose flu vaccine well.  Patient states he takes an OTC B1 & B12 at home. Does he need to continue the OTC supplements with the B12 injections?

## 2023-12-11 NOTE — Telephone Encounter (Signed)
 Pt returned call and nurse visit appt was scheduled.

## 2023-12-16 ENCOUNTER — Telehealth: Payer: Self-pay

## 2023-12-16 NOTE — Telephone Encounter (Signed)
 Called Patient to let him know that per Dr. Marylynn as long as he is receiving B12 injection he does not need to take any oral OTC B12 supplementations.

## 2023-12-17 ENCOUNTER — Ambulatory Visit

## 2024-01-15 ENCOUNTER — Ambulatory Visit (INDEPENDENT_AMBULATORY_CARE_PROVIDER_SITE_OTHER): Admitting: Internal Medicine

## 2024-01-15 ENCOUNTER — Encounter: Payer: Self-pay | Admitting: Internal Medicine

## 2024-01-15 VITALS — BP 166/80 | HR 78 | Ht 73.0 in | Wt 136.4 lb

## 2024-01-15 DIAGNOSIS — E538 Deficiency of other specified B group vitamins: Secondary | ICD-10-CM

## 2024-01-15 DIAGNOSIS — D631 Anemia in chronic kidney disease: Secondary | ICD-10-CM

## 2024-01-15 DIAGNOSIS — E559 Vitamin D deficiency, unspecified: Secondary | ICD-10-CM | POA: Diagnosis not present

## 2024-01-15 DIAGNOSIS — E519 Thiamine deficiency, unspecified: Secondary | ICD-10-CM

## 2024-01-15 DIAGNOSIS — E0520 Thyrotoxicosis with toxic multinodular goiter without thyrotoxic crisis or storm: Secondary | ICD-10-CM | POA: Diagnosis not present

## 2024-01-15 DIAGNOSIS — F1910 Other psychoactive substance abuse, uncomplicated: Secondary | ICD-10-CM

## 2024-01-15 DIAGNOSIS — Z681 Body mass index (BMI) 19 or less, adult: Secondary | ICD-10-CM | POA: Diagnosis not present

## 2024-01-15 DIAGNOSIS — I129 Hypertensive chronic kidney disease with stage 1 through stage 4 chronic kidney disease, or unspecified chronic kidney disease: Secondary | ICD-10-CM

## 2024-01-15 DIAGNOSIS — N183 Chronic kidney disease, stage 3 unspecified: Secondary | ICD-10-CM

## 2024-01-15 DIAGNOSIS — E0590 Thyrotoxicosis, unspecified without thyrotoxic crisis or storm: Secondary | ICD-10-CM

## 2024-01-15 DIAGNOSIS — E785 Hyperlipidemia, unspecified: Secondary | ICD-10-CM

## 2024-01-15 DIAGNOSIS — N1832 Chronic kidney disease, stage 3b: Secondary | ICD-10-CM | POA: Diagnosis not present

## 2024-01-15 DIAGNOSIS — E1122 Type 2 diabetes mellitus with diabetic chronic kidney disease: Secondary | ICD-10-CM | POA: Diagnosis not present

## 2024-01-15 DIAGNOSIS — Z66 Do not resuscitate: Secondary | ICD-10-CM

## 2024-01-15 DIAGNOSIS — I150 Renovascular hypertension: Secondary | ICD-10-CM

## 2024-01-15 MED ORDER — AMLODIPINE BESYLATE 10 MG PO TABS: 10.0000 mg | ORAL_TABLET | Freq: Every day | ORAL | 1 refills | Status: AC

## 2024-01-15 MED ORDER — TELMISARTAN 80 MG PO TABS: ORAL_TABLET | ORAL | 1 refills | Status: AC

## 2024-01-15 MED ORDER — METOPROLOL SUCCINATE ER 50 MG PO TB24: 50.0000 mg | ORAL_TABLET | Freq: Every day | ORAL | 1 refills | Status: AC

## 2024-01-15 MED ORDER — TETANUS-DIPHTH-ACELL PERTUSSIS 5-2-15.5 LF-MCG/0.5 IM SUSP: 0.5000 mL | Freq: Once | INTRAMUSCULAR | 0 refills | Status: AC

## 2024-01-15 MED ORDER — METHIMAZOLE 5 MG PO TABS: 5.0000 mg | ORAL_TABLET | Freq: Every day | ORAL | 1 refills | Status: AC

## 2024-01-15 NOTE — Patient Instructions (Addendum)
 Your blood pressure is high today.  Please confirm that you are taking 3 MEDICATIONS FOR BLOOD PRESSURE:  AMLODIPINE  METOPROLOL   TELMISARTAN   PLEASE SCHEDULE A NURSE AND LAB VISIT IN LATE JANUARY FOR BLOOD PRESSURE CHECK AND LABS

## 2024-01-15 NOTE — Progress Notes (Unsigned)
 Subjective:  Patient ID: Terry [name], male    DOB: [birth year]  Age: 89 y.o. MRN: [id number]  CC: There were no encounter diagnoses.   HPI Terry [name] presents for  Chief Complaint  Patient presents with   Medical Management of Chronic Issues    Follow up on multiple issues  1) grief, alcohol abuse.  Sleeping  wel,   not using alcohol daily,   spending the holiday in [place],  followed by daughter coming [place]from [place]   2 )  HTN  3) hyperthyroid  4)     Outpatient Medications Prior to Visit  Medication Sig Dispense Refill   amLODipine  (NORVASC ) 10 MG tablet Take 1 tablet (10 mg total) by mouth daily. 90 tablet 1   methimazole  (TAPAZOLE ) 5 MG tablet TAKE 1 TABLET (5 MG TOTAL) BY MOUTH DAILY. 90 tablet 1   metoprolol  succinate (TOPROL -XL) 50 MG 24 hr tablet Take 1 tablet (50 mg total) by mouth daily. 90 tablet 1   telmisartan  (MICARDIS ) 80 MG tablet TAKE 1 TABLET BY MOUTH EVERYDAY AT BEDTIME 90 tablet 1   thiamine  (VITAMIN B-1) 100 MG tablet Take 1 tablet (100 mg total) by mouth daily. 30 tablet 2   aspirin  EC 81 MG tablet Take 1 tablet (81 mg total) by mouth daily. (Patient not taking: Reported on 01/15/2024) 90 tablet 3   omeprazole  (PRILOSEC) 40 MG capsule Take 1 capsule (40 mg total) by mouth daily. (Patient not taking: Reported on 01/15/2024) 90 capsule 1   Vitamin D , Ergocalciferol , (DRISDOL ) 1.25 MG (50000 UNIT) CAPS capsule TAKE 1 CAPSULE BY MOUTH ONE TIME PER WEEK 12 capsule 0   albuterol  (VENTOLIN  HFA) 108 (90 Base) MCG/ACT inhaler Inhale 2 puffs into the lungs every 6 (six) hours as needed for wheezing or shortness of breath. 3.7 g 11   cyanocobalamin  1000 MCG tablet Take 1 tablet (1,000 mcg total) by mouth daily. 30 tablet 2   empagliflozin  (JARDIANCE ) 10 MG TABS tablet Take 1 tablet (10 mg total) by mouth daily before breakfast. (Patient not taking: Reported on 01/15/2024) 30 tablet 2   ipratropium-albuterol  (DUONEB) 0.5-2.5 (3) MG/3ML SOLN Take 3 mLs by  nebulization every 6 (six) hours as needed. 360 mL 2   polyethylene glycol (MIRALAX  / GLYCOLAX ) 17 g packet Take 17 g by mouth daily as needed.     No facility-administered medications prior to visit.    Review of Systems;  Patient denies headache, fevers, malaise, unintentional weight loss, skin rash, eye pain, sinus congestion and sinus pain, sore throat, dysphagia,  hemoptysis , cough, dyspnea, wheezing, chest pain, palpitations, orthopnea, edema, abdominal pain, nausea, melena, diarrhea, constipation, flank pain, dysuria, hematuria, urinary  Frequency, nocturia, numbness, tingling, seizures,  Focal weakness, Loss of consciousness,  Tremor, insomnia, depression, anxiety, and suicidal ideation.      Objective:  BP (!) 166/80   Pulse 78   Ht 6' 1 (1.854 m)   Wt 136 lb 6.4 oz (61.9 kg)   SpO2 97%   BMI 18.00 kg/m   BP Readings from Last 3 Encounters:  01/15/24 (!) 166/80  12/03/23 (!) 148/88  11/25/23 (!) 146/78    Wt Readings from Last 3 Encounters:  01/15/24 136 lb 6.4 oz (61.9 kg)  12/03/23 137 lb 12.8 oz (62.5 kg)  11/21/23 126 lb 5.2 oz (57.3 kg)    Physical Exam  Lab Results  Component Value Date   HGBA1C 6.7 (H) 11/22/2023   HGBA1C 8.4 (H) 06/25/2023   HGBA1C 7.2 (  H) 08/23/2022    Lab Results  Component Value Date   CREATININE 1.44 (H) 11/21/2023   CREATININE 1.45 07/18/2023   CREATININE 1.61 (H) 06/25/2023    Lab Results  Component Value Date   WBC 10.0 11/21/2023   HGB 11.7 (L) 11/21/2023   HCT 34.6 (L) 11/21/2023   PLT 360 11/21/2023   GLUCOSE 149 (H) 11/21/2023   CHOL 190 11/22/2023   TRIG 103 11/22/2023   HDL 82 11/22/2023   LDLDIRECT 103.0 06/25/2023   LDLCALC 87 11/22/2023   ALT 10 11/21/2023   AST 20 11/21/2023   NA 135 11/21/2023   K 4.2 11/21/2023   CL 98 11/21/2023   CREATININE 1.44 (H) 11/21/2023   BUN 29 (H) 11/21/2023   CO2 22 11/21/2023   TSH 0.221 (L) 11/22/2023   PSA 0.75 11/12/2013   INR 0.9 11/21/2023   HGBA1C 6.7 (H)  11/22/2023   MICROALBUR 84.0 (H) 06/25/2023    MR BRAIN W CONTRAST Result Date: 11/24/2023 EXAM: MRI BRAIN WITH CONTRAST 11/23/2023 09:03:51 PM TECHNIQUE: Multiplanar multisequence MRI of the head/brain was performed with the administration of intravenous contrast. COMPARISON: Non-contrast brain MRI 11/22/2023. CLINICAL HISTORY: 89 year old male with aphasia. No acute findings on non-contrast brain MRI 11/22/2023. Follow up postcontrast exam. FINDINGS: Stable brain volume and morphology. Following contrast the major dural venous sinuses are enhancing and appear patent. No abnormal intracranial enhancement. No abnormal dural thickening or enhancement. Negative for age visible cervical spine. IMPRESSION: 1. No abnormal enhancement or evidence of acute abnormality on postcontrast brain MRI. Electronically signed by: Helayne Hurst MD 11/24/2023 04:23 AM EDT RP Workstation: [id number]   ECHOCARDIOGRAM COMPLETE Result Date: 11/22/2023    ECHOCARDIOGRAM REPORT   Patient Name:   Terry [name] Date of Exam: 11/22/2023 Medical Rec #:  [id number]       Height:       73.0 in Accession #:    [id number]      Weight:       126.3 lb Date of Birth:  06/28/1933        BSA:          1.770 m Patient Age:    89 years        BP:           153/89 mmHg Patient Gender: M               HR:           96 bpm. Exam Location:  [facility] Procedure: 2D Echo and Color Doppler (Both Spectral and Color Flow Doppler were            utilized during procedure). Indications:     Stroke I63.9  History:         Patient has no prior history of Echocardiogram examinations.                  Stroke.  Sonographer:     Rosina Dunk Referring Phys:  [id number] DELAYNE LULLA SOLIAN Diagnosing Phys: [name]  Sonographer Comments: Technically difficult study due to poor echo windows. IMPRESSIONS  1. Technically very difficult study with no apical windows.  2. Left ventricle normal in size with probably mildly reduced systolic function based on limited  views. Accurate LVEF assessment or wall motion analysis cannot be performed.  3. Right ventricular systolic function is normal. The right ventricular size is normal.  4. The mitral valve is normal in structure. No evidence of mitral valve regurgitation.  5.  The aortic valve is tricuspid. Aortic valve regurgitation is not visualized.  6. The inferior vena cava is dilated in size with <50% respiratory variability, suggesting right atrial pressure of 15 mmHg. FINDINGS  Left Ventricle: Left ventricle normal in size with probably mildly reduced systolic function based on limited views. Accurate LVEF assessment or wall motion analysis cannot be performed. Right Ventricle: The right ventricular size is normal. No increase in right ventricular wall thickness. Right ventricular systolic function is normal. Left Atrium: Left atrial size was not well visualized. Right Atrium: Right atrial size was not well visualized. Pericardium: Trivial pericardial effusion is present. Mitral Valve: The mitral valve is normal in structure. No evidence of mitral valve regurgitation. Tricuspid Valve: The tricuspid valve is normal in structure. Tricuspid valve regurgitation is not demonstrated. Aortic Valve: The aortic valve is tricuspid. Aortic valve regurgitation is not visualized. Pulmonic Valve: The pulmonic valve was not well visualized. Pulmonic valve regurgitation is not visualized. Aorta: The ascending aorta was not well visualized. Venous: The inferior vena cava is dilated in size with less than 50% respiratory variability, suggesting right atrial pressure of 15 mmHg. IAS/Shunts: The interatrial septum was not well visualized.  LEFT VENTRICLE PLAX 2D LVIDd:         5.00 cm LVIDs:         4.30 cm LV PW:         1.20 cm LV IVS:        1.30 cm LVOT diam:     2.30 cm LVOT Area:     4.15 cm  IVC IVC diam: 2.20 cm  AORTA Ao Root diam: 4.00 cm  SHUNTS Systemic Diam: 2.30 cm [name] Electronically signed by [name] Signature  Date/Time: 11/22/2023/1:49:11 PM    Final    MR BRAIN WO CONTRAST Result Date: 11/22/2023 EXAM: MRI BRAIN WITHOUT CONTRAST 11/22/2023 01:54:24 AM TECHNIQUE: Multiplanar multisequence MRI of the head/brain was performed without the administration of intravenous contrast. COMPARISON: CT head 11/21/2023. CLINICAL HISTORY: Neuro deficit, acute, stroke suspected; new onset expressive aphasia. FINDINGS: BRAIN AND VENTRICLES: No acute infarct. No intracranial hemorrhage. No mass. No midline shift. No hydrocephalus. Normal flow voids. ORBITS: No acute abnormality. SINUSES AND MASTOIDS: No acute abnormality. BONES AND SOFT TISSUES: Normal marrow signal. No acute soft tissue abnormality. IMPRESSION: 1. No acute intracranial abnormality. Electronically signed by: Gilmore Molt MD 11/22/2023 02:07 AM EDT RP Workstation: [id number]    Assessment & Plan:  .There are no diagnoses linked to this encounter.   I spent 34 minutes on the day of this face to face encounter reviewing patient's  most recent visit with cardiology,  nephrology,  and neurology,  prior relevant surgical and non surgical procedures, recent  labs and imaging studies, counseling on weight management,  reviewing the assessment and plan with patient, and post visit ordering and reviewing of  diagnostics and therapeutics with patient  .   Follow-up: No follow-ups on file.   Verneita LITTIE Kettering, MD

## 2024-01-16 ENCOUNTER — Telehealth: Payer: Self-pay

## 2024-01-16 ENCOUNTER — Encounter: Payer: Self-pay | Admitting: Internal Medicine

## 2024-01-16 MED ORDER — ISOSORBIDE MONONITRATE ER 30 MG PO TB24: 30.0000 mg | ORAL_TABLET | Freq: Every day | ORAL | 1 refills | Status: AC

## 2024-01-16 NOTE — Assessment & Plan Note (Signed)
 Previously managed by  Endocrinology,  with methimazole , until her departure. TSH was suppressed in October due to medication noncompliiance  retrun in 2 weeks fot TSH  on current dose of  5 mg    Lab Results  Component Value Date   TSH 0.221 (L) 11/22/2023

## 2024-01-16 NOTE — Assessment & Plan Note (Signed)
 No longer a candidate for metformin  due to weight loss and GFR,  but he never started jardiance  which was prescribed in June  for UaCr > 300.  Will  repeat UaCR in 2 weeks and recommend medication if indicated    Lab Results  Component Value Date   HGBA1C 6.7 (H) 11/22/2023   Lab Results  Component Value Date   MICROALBUR 84.0 (H) 06/25/2023

## 2024-01-16 NOTE — Telephone Encounter (Signed)
 Copied from CRM #[id number]. Topic: General - Call Back - No Documentation >> Jan 16, 2024 10:03 AM Alfonso ORN wrote: Reason for CRM: pt called to advise pcp he is currently taking all 3 bp medications

## 2024-01-16 NOTE — Assessment & Plan Note (Signed)
 NOT AT GOAL IN MAX DOSES OF  metoprolol , amlodipine ,  and telmisartan ,   adding Imdur .

## 2024-01-16 NOTE — Telephone Encounter (Signed)
 Pt called and stated that he is taking the amlodipine , metoprolol  and the telmisartan .

## 2024-01-17 ENCOUNTER — Encounter: Payer: Self-pay | Admitting: Internal Medicine

## 2024-01-17 NOTE — Assessment & Plan Note (Signed)
 Multifactorial,  with advanced COPD. Hyperthyroidism, alcohol abuse  and grief all contributing.  Weight is stable  sine last visit and he is eating more regularly.

## 2024-01-17 NOTE — Assessment & Plan Note (Addendum)
 Repeated discussion today.  No changes.

## 2024-01-17 NOTE — Assessment & Plan Note (Signed)
 He reports reduced use of alcohol and has not been drinking daily.  Ancourage him to spend the holidays with his children despite his preference for being alone,  to mitigate the risk  of relapse

## 2024-01-17 NOTE — Assessment & Plan Note (Addendum)
 GFR is stable; he is avoiding nephrotoxic drugs.   Is taking maximal dose of telmisartan .  He did not start jardiance  due to cost.   Lab Results  Component Value Date   CREATININE 1.44 (H) 11/21/2023   Lab Results  Component Value Date   MICROALBUR 84.0 (H) 06/25/2023

## 2024-01-17 NOTE — Assessment & Plan Note (Signed)
 His anemia is mild and normocytic.   Lab Results  Component Value Date   WBC 10.0 11/21/2023   HGB 11.7 (L) 11/21/2023   HCT 34.6 (L) 11/21/2023   MCV 94.8 11/21/2023   PLT 360 11/21/2023

## 2024-01-17 NOTE — Assessment & Plan Note (Signed)
 Complicated by alcohol abuse and thiamine  deficiency.  Replacing both  Lab Results  Component Value Date   VITAMINB12 266 11/24/2023   Lab Results  Component Value Date   FOLATE 7.2 07/18/2023

## 2024-01-17 NOTE — Telephone Encounter (Signed)
 Spoke with pt and informed him of the bp medication that Dr. Tullo would like for him to add to his current regimen. Pt gave a verbal understanding.

## 2024-02-21 ENCOUNTER — Telehealth: Payer: Self-pay | Admitting: Internal Medicine

## 2024-02-21 NOTE — Telephone Encounter (Signed)
 Copied from CRM [id number]. Topic: Appointments - Scheduling Inquiry for Clinic >> Feb 21, 2024  1:37 PM Alfonso ORN wrote: Reason for CRM: pt called to ask provider if he should cancel his lab visit appointment due to weather/ clinic closure and if it is still necessary since recent lab . Please advise

## 2024-02-21 NOTE — Telephone Encounter (Signed)
 Called and spoke with patient to make him aware of Dr Marylynn recommendations. Patient got his appointment rescheduled to the following week on Tuesday February 3rd at 3:15 pm. Verbalized understanding.

## 2024-02-25 ENCOUNTER — Ambulatory Visit

## 2024-03-03 ENCOUNTER — Ambulatory Visit

## 2024-04-14 ENCOUNTER — Ambulatory Visit: Admitting: Internal Medicine

## 2024-07-28 ENCOUNTER — Ambulatory Visit
